# Patient Record
Sex: Male | Born: 1965 | Race: White | Hispanic: No | Marital: Married | Smoking: Never smoker
Health system: Southern US, Community
[De-identification: ages and names within clinical notes are randomized; demographics above are authoritative.]

## PROBLEM LIST (undated history)

## (undated) DIAGNOSIS — E785 Hyperlipidemia, unspecified: Secondary | ICD-10-CM

## (undated) DIAGNOSIS — I10 Essential (primary) hypertension: Secondary | ICD-10-CM

## (undated) DIAGNOSIS — G4730 Sleep apnea, unspecified: Secondary | ICD-10-CM

## (undated) HISTORY — PX: OTHER SURGICAL HISTORY: SHX169

## (undated) HISTORY — DX: Essential (primary) hypertension: I10

## (undated) HISTORY — DX: Hyperlipidemia, unspecified: E78.5

---

## 1995-07-30 HISTORY — PX: OTHER SURGICAL HISTORY: SHX169

## 2009-10-20 ENCOUNTER — Ambulatory Visit: Payer: Self-pay | Admitting: Family Medicine

## 2009-10-20 DIAGNOSIS — R030 Elevated blood-pressure reading, without diagnosis of hypertension: Secondary | ICD-10-CM | POA: Insufficient documentation

## 2009-10-20 DIAGNOSIS — R635 Abnormal weight gain: Secondary | ICD-10-CM | POA: Insufficient documentation

## 2009-10-22 ENCOUNTER — Encounter: Payer: Self-pay | Admitting: Family Medicine

## 2009-10-23 ENCOUNTER — Encounter: Payer: Self-pay | Admitting: Family Medicine

## 2009-10-23 LAB — CONVERTED CEMR LAB
Albumin: 4.4 g/dL (ref 3.5–5.2)
BUN: 20 mg/dL (ref 6–23)
Calcium: 9.5 mg/dL (ref 8.4–10.5)
Chloride: 105 meq/L (ref 96–112)
Cholesterol: 158 mg/dL (ref 0–200)
Creatinine, Ser: 1.09 mg/dL (ref 0.40–1.50)
Glucose, Bld: 106 mg/dL — ABNORMAL HIGH (ref 70–99)
HCT: 46.2 % (ref 39.0–52.0)
HDL: 49 mg/dL (ref >39)
Hemoglobin: 16.2 g/dL (ref 13.0–17.0)
Potassium: 4.7 meq/L (ref 3.5–5.3)
RBC: 5.33 M/uL (ref 4.22–5.81)
Total CHOL/HDL Ratio: 3.2
Triglycerides: 84 mg/dL (ref <150)

## 2009-11-15 ENCOUNTER — Ambulatory Visit: Payer: Self-pay | Admitting: Family Medicine

## 2009-11-15 DIAGNOSIS — R7301 Impaired fasting glucose: Secondary | ICD-10-CM | POA: Insufficient documentation

## 2009-11-15 DIAGNOSIS — R5383 Other fatigue: Secondary | ICD-10-CM

## 2009-11-15 DIAGNOSIS — R5381 Other malaise: Secondary | ICD-10-CM | POA: Insufficient documentation

## 2009-12-13 ENCOUNTER — Encounter: Admission: RE | Admit: 2009-12-13 | Discharge: 2010-03-13 | Payer: Self-pay | Admitting: Family Medicine

## 2010-01-24 ENCOUNTER — Telehealth: Payer: Self-pay | Admitting: Family Medicine

## 2010-01-31 ENCOUNTER — Ambulatory Visit: Payer: Self-pay | Admitting: Family Medicine

## 2010-02-01 ENCOUNTER — Encounter: Payer: Self-pay | Admitting: Family Medicine

## 2010-02-02 ENCOUNTER — Encounter: Payer: Self-pay | Admitting: Family Medicine

## 2010-02-02 LAB — CONVERTED CEMR LAB
Free T4: 1.09 ng/dL (ref 0.80–1.80)
Sex Hormone Binding: 29 nmol/L (ref 13–71)
TSH: 1.607 microintl units/mL (ref 0.350–4.500)
Testosterone Free: 45 pg/mL — ABNORMAL LOW (ref 47.0–244.0)
Testosterone-% Free: 2.1 % (ref 1.6–2.9)

## 2010-02-05 ENCOUNTER — Ambulatory Visit: Payer: Self-pay | Admitting: Family Medicine

## 2010-02-05 DIAGNOSIS — E291 Testicular hypofunction: Secondary | ICD-10-CM | POA: Insufficient documentation

## 2010-03-08 ENCOUNTER — Ambulatory Visit: Payer: Self-pay | Admitting: Family Medicine

## 2010-03-16 ENCOUNTER — Ambulatory Visit: Payer: Self-pay | Admitting: Family Medicine

## 2010-03-16 DIAGNOSIS — M7710 Lateral epicondylitis, unspecified elbow: Secondary | ICD-10-CM | POA: Insufficient documentation

## 2010-03-16 LAB — CONVERTED CEMR LAB: Hgb A1c MFr Bld: 5.5 %

## 2010-03-22 ENCOUNTER — Encounter: Admission: RE | Admit: 2010-03-22 | Discharge: 2010-04-26 | Payer: Self-pay | Admitting: Family Medicine

## 2010-03-23 ENCOUNTER — Ambulatory Visit: Payer: Self-pay | Admitting: Family Medicine

## 2010-03-30 ENCOUNTER — Ambulatory Visit: Payer: Self-pay | Admitting: Family Medicine

## 2010-04-06 ENCOUNTER — Ambulatory Visit: Payer: Self-pay | Admitting: Family Medicine

## 2010-04-13 ENCOUNTER — Ambulatory Visit: Payer: Self-pay | Admitting: Family Medicine

## 2010-04-20 ENCOUNTER — Ambulatory Visit: Payer: Self-pay | Admitting: Family Medicine

## 2010-05-04 ENCOUNTER — Ambulatory Visit: Payer: Self-pay | Admitting: Family Medicine

## 2010-05-07 LAB — CONVERTED CEMR LAB
Bilirubin, Direct: 0.3 mg/dL (ref 0.0–0.3)
Indirect Bilirubin: 1.1 mg/dL — ABNORMAL HIGH (ref 0.0–0.9)
Testosterone Free: 116.8 pg/mL (ref 47.0–244.0)
Testosterone-% Free: 2.4 % (ref 1.6–2.9)
Total Bilirubin: 1.4 mg/dL — ABNORMAL HIGH (ref 0.3–1.2)
Total Protein: 6.8 g/dL (ref 6.0–8.3)

## 2010-05-23 ENCOUNTER — Encounter: Payer: Self-pay | Admitting: Family Medicine

## 2010-05-28 ENCOUNTER — Encounter: Payer: Self-pay | Admitting: Family Medicine

## 2010-06-01 ENCOUNTER — Ambulatory Visit: Payer: Self-pay | Admitting: Family Medicine

## 2010-07-02 ENCOUNTER — Ambulatory Visit: Payer: Self-pay | Admitting: Family Medicine

## 2010-08-03 ENCOUNTER — Telehealth: Payer: Self-pay | Admitting: Family Medicine

## 2010-08-03 ENCOUNTER — Ambulatory Visit
Admission: RE | Admit: 2010-08-03 | Discharge: 2010-08-03 | Payer: Self-pay | Source: Home / Self Care | Attending: Family Medicine | Admitting: Family Medicine

## 2010-08-28 NOTE — Assessment & Plan Note (Signed)
Summary: TESTOSTERONE INJECTION  Nurse Visit   Allergies: No Known Drug Allergies  Medication Administration  Injection # 1:    Medication: Testosterone Cypionat 200mg  ing    Diagnosis: HYPOGONADISM (ICD-257.2)    Route: IM    Site: RUOQ gluteus    Exp Date: 05/29/2012    Lot #: [id number]    Mfr: pfizer    Patient tolerated injection without complications    Given by: Kathlene November (February 05, 2010 8:05 AM)  Orders Added: 1)  Testosterone Cypionat 200mg  ing [J1080] 2)  Admin of Therapeutic Inj  intramuscular or subcutaneous [16109]

## 2010-08-28 NOTE — Assessment & Plan Note (Signed)
Summary: NURSE  Nurse Visit   Allergies: No Known Drug Allergies  Medication Administration  Injection # 1:    Medication: Testosterone Cypionat 200mg  ing    Diagnosis: HYPOGONADISM (ICD-257.2)    Route: IM    Site: RUOQ gluteus    Exp Date: 07/29/2012    Lot #: [id number]    Mfr: pfizer    Comments: 200 mg/g     Patient tolerated injection without complications    Given by: Avon Gully CMA, Duncan Dull) (April 20, 2010 8:37 AM)  Orders Added: 1)  Admin of Therapeutic Inj  intramuscular or subcutaneous [21308]

## 2010-08-28 NOTE — Consult Note (Signed)
Summary: [facility]   Imported By: Lanelle Bal 06/19/2010 14:16:43  _____________________________________________________________________  External Attachment:    Type:   Image     Comment:   External Document

## 2010-08-28 NOTE — Assessment & Plan Note (Signed)
Summary: 6 week f/u, injection  test, endocrine   Vital Signs:  Patient profile:   45 year old male Height:      70.5 inches Weight:      252 pounds Pulse rate:   75 / minute BP sitting:   141 / 66  (left arm) Cuff size:   large  Vitals Entered By: Avon Gully CMA, Duncan Dull) (May 04, 2010 8:12 AM) CC: f/u testosterone,pt feels down because he is still gaining wt even since he started testosterone and seeing the nutitionist   Primary Care Provider:  Nani Gasser MD  CC:  f/u testosterone and pt feels down because he is still gaining wt even since he started testosterone and seeing the nutitionist.  History of Present Illness: f/u testosterone, pt feels down because he is still gaining wt even since he started testosterone and seeing the nutitionist. ON the testosterone feel bulidng better muscle mass.  Has felt more fatigued. Falling asleep at work. Denies any side effects from testoterone. No mood changes.   No snoring or hx of sleep apnea  Current Medications (verified): 1)  Fioricet 50-325-40 Mg Tabs (Butalbital-Apap-Caffeine) .... As Needed For Headache 2)  Mega Multi Men  Cr-Tabs (Multiple Vitamins-Minerals) .... Take 1 Tablet By Mouth Once A Day 3)  Flax Seed Oil 1000 Mg Caps (Flaxseed (Linseed)) .... Take 2 Capsules Daily. 4)  Cinnamon 500 Mg Tabs (Cinnamon) .... Take 2 Tablets Daily. 5)  Testosterone Cypionate 200 Mg/ml Oil (Testosterone Cypionate) .... 200 Mg Im Injection Q 4 Wks 6)  Hydrochlorothiazide 25 Mg Tabs (Hydrochlorothiazide) .... Take 1 Tablet By Mouth Once A Day  Allergies (verified): No Known Drug Allergies  Comments:  Nurse/Medical Assistant: The patient's medications and allergies were reviewed with the patient and were updated in the Medication and Allergy Lists. Avon Gully CMA, Duncan Dull) (May 04, 2010 8:13 AM)  Physical Exam  General:  Well-developed,well-nourished,in no acute distress; alert,appropriate and cooperative  throughout examination   Impression & Recommendations:  Problem # 1:  HYPOGONADISM (ICD-257.2) Will check levels and labs today. Make sure liver and PSA are stable.  Can give next injection today.  Orders: T-Testosterone, Free and Total [phone]) T-PSA [phone]) T-Hepatic Function [phone] Testosterone Cypionat 200mg  ing (J1080) Admin of Therapeutic Inj  intramuscular or subcutaneous (54270)  Problem # 2:  WEIGHT GAIN (ICD-783.1) I this point I am not sure why he is not losing weight. He has been working with a nutritionist adn has been working out regularly . Has had normal thyroid and AM cortisol. Will refer to Endocrine for further evaluation.  Orders: Endocrinology Referral (Endocrine)  Complete Medication List: 1)  Fioricet 50-325-40 Mg Tabs (Butalbital-apap-caffeine) .... As needed for headache 2)  Mega Multi Men Cr-tabs (Multiple vitamins-minerals) .... Take 1 tablet by mouth once a day 3)  Flax Seed Oil 1000 Mg Caps (Flaxseed (linseed)) .... Take 2 capsules daily. 4)  Cinnamon 500 Mg Tabs (Cinnamon) .... Take 2 tablets daily. 5)  Testosterone Cypionate 200 Mg/ml Oil (Testosterone cypionate) .... 200 mg im injection q 4 wks 6)  Hydrochlorothiazide 25 Mg Tabs (Hydrochlorothiazide) .... Take 1 tablet by mouth once a day  Other Orders: Flu Vaccine 41yrs + (62376) Admin 1st Vaccine (28315)  Patient Instructions: 1)  We will call you with the endocrine referral.  2)  Once get labs will call and let you know when to f/u for the next test injection.    Medication Administration  Injection # 1:    Medication: Testosterone  Cypionat 200mg  ing    Diagnosis: HYPOGONADISM (ICD-257.2)    Route: IM    Site: LUOQ gluteus    Exp Date: 07/29/2012    Lot #: [id number]    Mfr: Pfizer    Patient tolerated injection without complications    Given by: Avon Gully CMA, Duncan Dull) (May 04, 2010 8:50 AM)  Orders Added: 1)  T-Testosterone, Free and Total  [phone] 2)  T-PSA [phone] 3)  T-Hepatic Function [phone] 4)  Endocrinology Referral [Endocrine] 5)  Testosterone Cypionat 200mg  ing [J1080] 6)  Flu Vaccine 71yrs + [90658] 7)  Admin 1st Vaccine [90471] 8)  Admin of Therapeutic Inj  intramuscular or subcutaneous [96372] 9)  Est. Patient Level II [60630]    Immunizations Administered:  Influenza Vaccine # 1:    Vaccine Type: Fluvax 3+    Site: right deltoid    Mfr: GlaxoSmithKline    Dose: 0.5 ml    Route: IM    Given by: Sue Lush McCrimmon CMA, (AAMA)    Exp. Date: 01/26/2011    Lot #: [id number]    VIS given: 02/20/10 version given May 04, 2010.  Flu Vaccine Consent Questions:    Do you have a history of severe allergic reactions to this vaccine? no    Any prior history of allergic reactions to egg and/or gelatin? no    Do you have a sensitivity to the preservative Thimersol? no    Do you have a past history of Guillan-Barre Syndrome? no    Do you currently have an acute febrile illness? no    Have you ever had a severe reaction to latex? no    Vaccine information given and explained to patient? no

## 2010-08-28 NOTE — Assessment & Plan Note (Signed)
Summary: nurse  Nurse Visit   Allergies: No Known Drug Allergies  Medication Administration  Injection # 1:    Medication: Testosterone Cypionat 200mg  ing    Diagnosis: HYPOGONADISM (ICD-257.2)    Route: IM    Site: RUOQ gluteus    Exp Date: 07/29/2012    Lot #: [id number]    Mfr: Pfizer    Patient tolerated injection without complications    Given by: Kathlene November (April 13, 2010 3:24 PM)  Orders Added: 1)  Testosterone Cypionat 200mg  ing [J1080] 2)  Admin of Therapeutic Inj  intramuscular or subcutaneous [16109]

## 2010-08-28 NOTE — Assessment & Plan Note (Signed)
Summary: injection  Nurse Visit   Allergies: No Known Drug Allergies  Medication Administration  Injection # 1:    Medication: Testosterone Cypionat 200mg  ing    Diagnosis: HYPOGONADISM (ICD-257.2)    Route: IM    Site: RUOQ gluteus    Exp Date: 11/26/2012    Lot #: [id number]    Mfr: Pfizer    Patient tolerated injection without complications    Given by: Kathlene November LPN (June 01, 2010 8:26 AM)  Orders Added: 1)  Testosterone Cypionat 200mg  ing [J1080] 2)  Admin of Therapeutic Inj  intramuscular or subcutaneous [04540]

## 2010-08-28 NOTE — Assessment & Plan Note (Signed)
Summary: testosterone  Nurse Visit   Allergies: No Known Drug Allergies  Medication Administration  Injection # 1:    Medication: Testosterone Cypionat 200mg  ing    Diagnosis: HYPOGONADISM (ICD-257.2)    Route: IM    Site: LUOQ gluteus    Exp Date: 07/29/2012    Lot #: [id number]    Mfr: Pfizer    Comments: 200mg /ml given     Patient tolerated injection without complications    Given by: Avon Gully CMA, Duncan Dull) (April 06, 2010 8:39 AM)  Orders Added: 1)  Testosterone Cypionat 200mg  ing [J1080] 2)  Admin of Therapeutic Inj  intramuscular or subcutaneous [16109]

## 2010-08-28 NOTE — Progress Notes (Signed)
Summary: Labs  Phone Note Call from Patient Call back at [phone]   Caller: Dustin Kennedy- Registered Dietician Call For: Dustin Kennedy Summary of Call: Pt in nutrition class at [facility] and is not having very much sucess with weight loss per Dustin Kennedy RD. Following a 1800 calorie diet and exercising daily and would like additional labs like extensive thyroid testing and testosterone level and anything else that could effect the weight loss. Dustin states he is really pushing her for additional labs and she told him that was out of her scope of practice and that she would let the MD know so they could order any labs Initial call taken by: Kathlene November,  January 24, 2010 2:09 PM Caller: Dustin Kennedy Call For: Dustin Kennedy  Follow-up for Phone Call        Pls schedule pt with me this wk or next to review and do labs on this day. Follow-up by: Seymour Bars DO,  January 24, 2010 4:30 PM     Appended Document: Labs 01/25/2010 @ 11:30am-Pt notifeid to call and schedule an apointment with Dr. Cathey Endow via VM. KJ LPN

## 2010-08-28 NOTE — Assessment & Plan Note (Signed)
Summary: testosterone  Nurse Visit   Allergies: No Known Drug Allergies  Medication Administration  Injection # 1:    Medication: Testosterone Cypionat 200mg  ing    Diagnosis: HYPOGONADISM (ICD-257.2)    Route: IM    Site: RUOQ gluteus    Exp Date: 07/29/2012    Lot #:  [id number]    Mfr: Pfizer    Comments: 200mg /ml injected    Patient tolerated injection without complications    Given by: Avon Gully CMA, Duncan Dull) (March 30, 2010 8:50 AM)  Orders Added: 1)  Testosterone Cypionat 200mg  ing [J1080] 2)  Admin of Therapeutic Inj (IM or [place]) [13086]

## 2010-08-28 NOTE — Assessment & Plan Note (Signed)
Summary: 4 MONTH FU HTN, sugar, Late epicondylitis   Vital Signs:  Patient profile:   45 year old male Height:      70.5 inches Weight:      248 pounds Pulse rate:   80 / minute BP sitting:   142 / 73  (left arm) Cuff size:   large  Vitals Entered By: Avon Gully CMA, Duncan Dull) (March 16, 2010 8:17 AM) CC: check FBS   CC:  check FBS.  Current Medications (verified): 1)  Fioricet 50-325-40 Mg Tabs (Butalbital-Apap-Caffeine) .... As Needed For Headache 2)  Mega Multi Men  Cr-Tabs (Multiple Vitamins-Minerals) .... Take 1 Tablet By Mouth Once A Day 3)  Flax Seed Oil 1000 Mg Caps (Flaxseed (Linseed)) .... Take 2 Capsules Daily. 4)  Cinnamon 500 Mg Tabs (Cinnamon) .... Take 2 Tablets Daily. 5)  Testosterone Cypionate 200 Mg/ml Oil (Testosterone Cypionate) .... 200 Mg Im Injection Q 4 Wks  Allergies (verified): No Known Drug Allergies  Comments:  Nurse/Medical Assistant: The patient's medications and allergies were reviewed with the patient and were updated in the Medication and Allergy Lists. Avon Gully CMA, Duncan Dull) (March 16, 2010 8:17 AM)  Physical Exam  General:  Well-developed,well-nourished,in no acute distress; alert,appropriate and cooperative throughout examination Head:  Normocephalic and atraumatic without obvious abnormalities. No apparent alopecia or balding. Lungs:  Normal respiratory effort, chest expands symmetrically. Lungs are clear to auscultation, no crackles or wheezes. Heart:  Normal rate and regular rhythm. S1 and S2 normal without gallop, murmur, click, rub or other extra sounds. Msk:  Left elbow with NROM and strength 5/5. Tender over the lateral epicondyle. Pain with wrist extensino against resistance adn with supination.  Extremities:  NO LE edema.  Skin:  no rashes.   Psych:  Cognition and judgment appear intact. Alert and cooperative with normal attention span and concentration. No apparent delusions, illusions, hallucinations   Impression  & Recommendations:  Problem # 1:  ELEVATED BLOOD PRESSURE WITHOUT DIAGNOSIS OF HYPERTENSION (ICD-796.2) BP still elevated. Discussed stating a medication. Will start with a diuretic. Warned about potential SE.S/U in 6 weeks nad check BMP at that time. He is still working with a nutritionnist to loose weight. Eating 1800 calories a day.  His updated medication list for this problem includes:    Hydrochlorothiazide 25 Mg Tabs (Hydrochlorothiazide) .Marland Kitchen... Take 1 tablet by mouth once a day  Problem # 2:  IMPAIRED FASTING GLUCOSE (ICD-790.21) Glucose 140 this AM fasting but A1C is at goal.  Orders: Fingerstick (36416) Glucose, (CBG) (82962) Hemoglobin A1C (83036)  Problem # 3:  LATERAL EPICONDYLITIS (ICD-726.32) Given H.O on exercises. Iceing and NSAIDs for pan relief.  Discussed dec his weights with weight lifting as this is clearly exacerbating his sxs.   F/U in 6 weeks.   Problem # 4:  HYPOGONADISM (ICD-257.2) One week to see the nurse for next testosterone shot. Will do weekly for one month and then every other week for one month and then recheck levels.   Complete Medication List: 1)  Fioricet 50-325-40 Mg Tabs (Butalbital-apap-caffeine) .... As needed for headache 2)  Mega Multi Men Cr-tabs (Multiple vitamins-minerals) .... Take 1 tablet by mouth once a day 3)  Flax Seed Oil 1000 Mg Caps (Flaxseed (linseed)) .... Take 2 capsules daily. 4)  Cinnamon 500 Mg Tabs (Cinnamon) .... Take 2 tablets daily. 5)  Testosterone Cypionate 200 Mg/ml Oil (Testosterone cypionate) .... 200 mg im injection q 4 wks 6)  Hydrochlorothiazide 25 Mg Tabs (Hydrochlorothiazide) .... Take 1 tablet by  mouth once a day  Patient Instructions: 1)  See handout for your elbow 2)  Start HCTZ blood pressure med once a day in the AM 3)  Follow up in 6-8 weeks for blood pressure and testosterone.  4)  One week to see the nurse for next testosterone shot. Will do weekly for one month and then every other week for one  month and then recheck levels.  Prescriptions: HYDROCHLOROTHIAZIDE 25 MG TABS (HYDROCHLOROTHIAZIDE) Take 1 tablet by mouth once a day  #30 x 1   Entered and Authorized by:   Nani Gasser MD   Signed by:   Nani Gasser MD on 03/16/2010   Method used:   Electronically to        [facility] [phone]* (retail)       [address]       [place], [place]  [address]       Ph: [phone]       Fax: [phone]   RxID:   [id number]   Laboratory Results   Blood Tests   Date/Time Received: 03/16/10 Date/Time Reported: 03/16/10  HGBA1C: 5.5%   (Normal Range: Non-Diabetic - 3-6%   Control Diabetic - 6-8%) CBG Fasting:: 140mg /dL         Appended Document: 4 MONTH FU HTN, sugar, Late epicondylitis    Clinical Lists Changes  Orders: Added new Service order of Testosterone Cypionat 200mg  ing (G4010) - Signed Added new Service order of Admin of Therapeutic Inj (IV) (27253) - Signed       Medication Administration  Injection # 1:    Medication: Testosterone Cypionat 200mg  ing    Diagnosis: HYPOGONADISM (ICD-257.2)    Route: IM    Site: RUOQ gluteus    Exp Date: 07/29/2012    Lot #: [id number]    Mfr: Phizer    Patient tolerated injection without complications    Given by: Avon Gully CMA, Duncan Dull) (March 16, 2010 9:57 AM)  Orders Added: 1)  Testosterone Cypionat 200mg  ing [J1080] 2)  Admin of Therapeutic Inj (IV) [66440]

## 2010-08-28 NOTE — Assessment & Plan Note (Signed)
Summary: injection  Nurse Visit   Allergies: No Known Drug Allergies  Medication Administration  Injection # 1:    Medication: Testosterone Cypionat 200mg  ing    Diagnosis: HYPOGONADISM (ICD-257.2)    Route: IM    Site: RUOQ gluteus    Exp Date: 07/29/2012    Lot #: [id number]    Mfr: pfizer    Patient tolerated injection without complications    Given by: Kathlene November (March 23, 2010 8:39 AM)  Orders Added: 1)  Testosterone Cypionat 200mg  ing [J1080] 2)  Admin of Therapeutic Inj  intramuscular or subcutaneous [18841]

## 2010-08-28 NOTE — Assessment & Plan Note (Signed)
Summary: NOV: WEight flutuations, elevated BP   Vital Signs:  Patient profile:   45 year old male Height:      70.5 inches Weight:      246.75 pounds BMI:     35.03 Pulse rate:   69 / minute Pulse rhythm:   regular Resp:     16 per minute BP sitting:   143 / 87  (right arm) Cuff size:   large  Vitals Entered By: Mervin Kung CMA (October 20, 2009 8:41 AM) Is Patient Diabetic? No   Primary Care Provider:  Nani Gasser MD   History of Present Illness: Hx of HTN, Stopped meds in 2009 so stopped the med. Was having HA initally but these resolved.  Was taking Norvasc and then fiorcet for the HA. NO recent HA.  His of elevated choleserol. No CP or SOB. Weight really fluctuates even thought works on regularly.  He is concerned about hormone issues.  Feels eat about 1800 calories.  Also feels short term memory is off.  Occ feels more chilly whereas previously si more hot natured.  No skin or hair changes.   No swelling or hands or feet. Has notice some increased urgency to his urination but it is usually in the mornings. Does drink over 60 oz of fluid in the first 2-3 hours that he gets up. Denies any change in urinary stream or force of stream or dribbling. Denies waking up more than once at night to urinate.  Does drink a protein drink inteh AM as well but no creatine products.  Says thinks he eats about 1800 calories per day.     Habits & Providers  Alcohol-Tobacco-Diet     Alcohol drinks/day: 3     Tobacco Status: never  Exercise-Depression-Behavior     Does Patient Exercise: yes     Type of exercise: cardio/weights     Exercise (avg: min/session): 30-60     Times/week: 6     Drug Use: no  Allergies (verified): No Known Drug Allergies  Past History:  Past Medical History: HTN Hypercholesterolemia  Past Surgical History: Left shoulder--1997 Lipomas removal x 4.   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  Associ degree. Married to [place] with 2 adult kids.  Never Smoked Alcohol use-yes Drug use-no Regular exercise with cardio and weights.  3-4 caffeinated dirnsk per day (coffee) Smoking Status:  never Drug Use:  no Does Patient Exercise:  yes  Review of Systems       No fever/sweats/weakness, unexplained weight loss/gain.  No vison changes.  No difficulty hearing/ringing in ears, hay fever/allergies.  No chest pain/discomfort, palpitations.  No Br lump/nipple discharge.  No cough/wheeze.  No blood in BM, nausea/vomiting/diarrhea.  No nighttime urination, leaking urine, unusual vaginal bleeding, discharge (penis or vagina).  No muscle/joint pain. No rash, change in mole.  No HA, memory loss.  No anxiety, sleep d/o, depression.  No easy bruising/bleeding, unexplained lump   Physical Exam  General:  Well-developed,well-nourished,in no acute distress; alert,appropriate and cooperative throughout examination Head:  Normocephalic and atraumatic without obvious abnormalities. No apparent alopecia or balding. Neck:  No deformities, masses, or tenderness noted. No TM.  Lungs:  Normal respiratory effort, chest expands symmetrically. Lungs are clear to auscultation, no crackles or wheezes. Heart:  Normal rate and regular rhythm. S1 and S2 normal without gallop, murmur, click, rub or other extra sounds. Pulses:  RAdial 2+ bilat.  Neurologic:  alert & oriented X3.   Skin:  no rashes.   Cervical Nodes:  No lymphadenopathy noted Psych:  Cognition and judgment appear intact. Alert and cooperative with normal attention span and concentration. No apparent delusions, illusions, hallucinations   Impression & Recommendations:  Problem # 1:  WEIGHT GAIN (ICD-783.1) Wil check his thyroid level.  Also will refer for nutrition counseling for weight loss.  I really think this is not hormonal and may have to do with his fluctuation in work out and diet.  He is not using  creatine. Will also rule out any kidney or liver dysfunction.   Orders: T-TSH ([phone]) T-Comprehensive Metabolic Panel [id number]) T-CBC No Diff ([id number]) Nutrition Referral (Nutrition)  Problem # 2:  ELEVATED BLOOD PRESSURE WITHOUT DIAGNOSIS OF HYPERTENSION (ICD-796.2) REpeat was high but he does drink an enourmous amount of caffeine. Probely between 40-60 ounces in teh AM. Discussed cutting back on his caffeine intake and then recheck in BP in 3 weeks.  In meantime will check lipid screen adn thyroid d/o.  Also avoid salt intake.  Orders: T-TSH [phone]) T-Comprehensive Metabolic Panel [phone]) T-Lipid Profile [phone]) T-CBC No Diff ([id number])  Complete Medication List: 1)  Fioricet 50-325-40 Mg Tabs (Butalbital-apap-caffeine) .... As needed for headache  Patient Instructions: 1)  Please schedule a follow-up appointment in 3-4 weeks for repeat BP check. Decrease caffeine intake to a more moderate level.    Current Allergies (reviewed today): No known allergies      Vital Signs:  Patient Profile:   45 year old male Height:     70.5 inches Weight:      246.75 pounds BMI:     35.03 Pulse rate:   69 / minute Pulse rhythm:   regular Resp:     16 per minute BP sitting:   143 / 87 Cuff size:   large                   Appended Document: NOV: WEight flutuations, elevated BP    Family History: HTN, Hi cholesterol,  Thyroid dz stroke  Call pt: Thank him for the medical hx update. We will call again once I recieve his lab report. March 28, 20118:46 AM Saabir Blyth MD, Santina Evans  10/23/2009 @ 8:47am-LMOM of above. KJ LPN

## 2010-08-28 NOTE — Assessment & Plan Note (Signed)
Summary: 3 WEEK BP CK   Vital Signs:  Patient profile:   45 year old male Height:      70.5 inches Weight:      246 pounds Pulse rate:   55 / minute BP sitting:   128 / 71  (left arm) Cuff size:   large  Vitals Entered By: Kathlene November (November 15, 2009 8:04 AM) CC: follow-up BP   CC:  follow-up BP.  Current Medications (verified): 1)  Fioricet 50-325-40 Mg Tabs (Butalbital-Apap-Caffeine) .... As Needed For Headache  Allergies (verified): No Known Drug Allergies  Comments:  Nurse/Medical Assistant: The patient's medications and allergies were reviewed with the patient and were updated in the Medication and Allergy Lists. Kathlene November (November 15, 2009 8:04 AM)  Physical Exam  General:  Well-developed,well-nourished,in no acute distress; alert,appropriate and cooperative throughout examination Head:  Normocephalic and atraumatic without obvious abnormalities. No apparent alopecia or balding.   Impression & Recommendations:  Problem # 1:  ELEVATED BLOOD PRESSURE WITHOUT DIAGNOSIS OF HYPERTENSION (ICD-796.2) Looks great today. He has cut his caffeine by halft. Recheck in 6-12 months. Pt says has also been folllowing it at home and it looks good at home.  Overall he really has a healthy diet and works out.   Problem # 2:  IMPAIRED FASTING GLUCOSE (ICD-790.21)  Reviewed his labs with him today and it looks great, excpet for glucose. Will recheck today. Repeat still in insulin resistance range. Discussed decreasing carbs and concnetrated sweets. He already has appt wih the nutritionist. Recheck in 4 months.   Orders: Fingerstick (36416) Glucose, (CBG) (16109)  Problem # 3:  FATIGUE, ACUTE (ICD-780.79) Discussed that likely from weaning his caffeine. His body is probably getting used to this.  If not better in a couple of weeks then will check Free T4/T3, AM cortisol, and AM testosterone.    Complete Medication List: 1)  Fioricet 50-325-40 Mg Tabs (Butalbital-apap-caffeine)  .... As needed for headache  Patient Instructions: 1)  Please schedule a follow-up appointment in 4 months to recheck blood sugar.   Laboratory Results   Blood Tests   Date/Time Received: 11/15/2009 Date/Time Reported: 11/15/2009  Glucose (fasting): 111 mg/dL   (Normal Range: 60-454)

## 2010-08-28 NOTE — Letter (Signed)
Summary: Letter from Patient Regarding Family Health History  Letter from Patient Regarding Family Health History   Imported By: Lanelle Bal 10/27/2009 14:00:21  _____________________________________________________________________  External Attachment:    Type:   Image     Comment:   External Document

## 2010-08-28 NOTE — Assessment & Plan Note (Signed)
Summary: weight   Vital Signs:  Patient profile:   45 year old male Height:      70.5 inches Weight:      248.75 pounds BMI:     35.31 Temp:     97.8 degrees F oral Pulse rate:   65 / minute Pulse rhythm:   regular Resp:     18 per minute BP sitting:   144 / 82  (right arm) Cuff size:   large  Vitals Entered By: Mervin Kung CMA Duncan Dull) (January 31, 2010 2:24 PM) CC: Room 1   Pt at plateau with weight loss and would like thyroid and testosterone work up to assess possible reasons. Is Patient Diabetic? No   Primary Care Provider:  Nani Gasser MD  CC:  Room 1   Pt at plateau with weight loss and would like thyroid and testosterone work up to assess possible reasons..  History of Present Illness: 45 yo WM presents for f/u weight management.  He has met with a nutritionist and was eating 2200 kcal/ day which was reduced to 1800 kcal/ day.    He works out by doing cardio and [employer].  He has gained 25 lbs in 6 mos but he doesn't know why.  He travels 2 days/ wk for work.  Has a sedentary job.  Would like to get down to 220.   He had a normal TSH in March.     Allergies (verified): No Known Drug Allergies  Past History:  Past Medical History: Reviewed history from 10/20/2009 and no changes required. HTN Hypercholesterolemia  Past Surgical History: Reviewed history from 10/20/2009 and no changes required. Left shoulder--1997 Lipomas removal x 4.   Social History: Reviewed history from 10/20/2009 and no changes required. 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  Associ degree. Married to [place] with 2 adult kids.  Never Smoked Alcohol use-yes Drug use-no Regular exercise with cardio and weights.  3-4 caffeinated dirnsk per day (coffee)  Review of Systems      See HPI  Physical Exam  General:  alert, well-developed, well-nourished, and well-hydrated.   Head:  normocephalic and atraumatic.   Neck:  no masses.   Lungs:  Normal respiratory effort, chest expands  symmetrically. Lungs are clear to auscultation, no crackles or wheezes. Heart:  Normal rate and regular rhythm. S1 and S2 normal without gallop, murmur, click, rub or other extra sounds. Msk:  mild diastasis recti of the upper ab muscles Skin:  color normal.     Impression & Recommendations:  Problem # 1:  WEIGHT GAIN (ICD-783.1) Counseled pt about his weight management plan.  Will obtain Free T3 and Free T4 with his TSH today along with his testosterone level.  He should stay around 1800 kcal/ day diet, high protein / low carb.   Orders: T-TSH [phone]) T-T3, Free [phone]) T-T4, Free [phone]) T-Testosterone, Free and Total [phone])  Complete Medication List: 1)  Fioricet 50-325-40 Mg Tabs (Butalbital-apap-caffeine) .... As needed for headache 2)  Mega Multi Men Cr-tabs (Multiple vitamins-minerals) .... Take 1 tablet by mouth once a day 3)  Flax Seed Oil 1000 Mg Caps (Flaxseed (linseed)) .... Take 2 capsules daily. 4)  Cinnamon 500 Mg Tabs (Cinnamon) .... Take 2 tablets daily.  Patient Instructions: 1)  Labs today. 2)  will call you w/ results tomorrow. 3)  Keep up the 1800 kcal / day diet. 4)  Focus on high protein low carb. 5)  Aim for 1+ hrs of exercise 5 days/ wk.  Current  Allergies (reviewed today): No known allergies

## 2010-08-28 NOTE — Assessment & Plan Note (Signed)
Summary: INJECTIOn  Nurse Visit   Allergies: No Known Drug Allergies  Medication Administration  Injection # 1:    Medication: Testosterone Cypionat 200mg  ing    Diagnosis: HYPOGONADISM (ICD-257.2)    Route: IM    Site: RUOQ gluteus    Exp Date: 07/29/2012    Lot #: [id number]    Mfr: Pfizer    Patient tolerated injection without complications    Given by: Kathlene November (March 08, 2010 8:27 AM)  Orders Added: 1)  Testosterone Cypionat 200mg  ing [J1080] 2)  Admin of Therapeutic Inj  intramuscular or subcutaneous [[id number]]

## 2010-08-30 NOTE — Progress Notes (Signed)
Summary: PT REQ A CALL  Phone Note Call from Patient   Caller: Patient Summary of Call: PT REQ TO HAVE NURSE CALL TO ADVISE WHEN HE NEEDS TO SCHEDULE APPOINTMENT WITH DR. Linford Arnold [phone] Initial call taken by: Janeal Holmes,  August 03, 2010 10:12 AM  Follow-up for Phone Call        Called pt and LM for pt to return call. Follow-up by: Kathlene November LPN,  August 07, 2010 4:09 PM  Additional Follow-up for Phone Call Additional follow up Details #1::        lm to return call;left message on vm that pt due for f/u labs on bili and test in feb Additional Follow-up by: Avon Gully CMA, Duncan Dull),  August 10, 2010 12:00 PM

## 2010-08-30 NOTE — Assessment & Plan Note (Signed)
Summary: TEST inj  Nurse Visit   Allergies: No Known Drug Allergies  Medication Administration  Injection # 1:    Medication: Testosterone Cypionat 200mg  ing    Diagnosis: HYPOGONADISM (ICD-257.2)    Route: IM    Site: RUOQ gluteus    Exp Date: 11/27/2011    Lot #: [id number]    Patient tolerated injection without complications    Given by: Avon Gully CMA, Duncan Dull) (August 03, 2010 8:41 AM)  Orders Added: 1)  Testosterone Cypionat 200mg  ing [J1080] 2)  Admin of patients own med IM/SQ [[id number]]

## 2010-08-31 ENCOUNTER — Ambulatory Visit: Payer: PRIVATE HEALTH INSURANCE

## 2010-08-31 ENCOUNTER — Encounter: Payer: Self-pay | Admitting: Family Medicine

## 2010-08-31 ENCOUNTER — Ambulatory Visit: Admit: 2010-08-31 | Payer: Self-pay | Admitting: Family Medicine

## 2010-08-31 DIAGNOSIS — E291 Testicular hypofunction: Secondary | ICD-10-CM

## 2010-09-05 NOTE — Assessment & Plan Note (Signed)
Summary: NURSE VISIT  Nurse Visit   Allergies: No Known Drug Allergies  Medication Administration  Injection # 1:    Medication: Testosterone Cypionat 200mg  ing    Diagnosis: HYPOGONADISM (ICD-257.2)    Route: IM    Site: LUOQ gluteus    Exp Date: 11/26/2012    Lot #: [id number]    Mfr: pfizer    Patient tolerated injection without complications    Given by: Avon Gully CMA, Duncan Dull) (July 02, 2010 8:41 AM)  Orders Added: 1)  Testosterone Cypionat 200mg  ing [J1080] 2)  Admin of Therapeutic Inj  intramuscular or subcutaneous [[id number]]

## 2010-09-05 NOTE — Miscellaneous (Signed)
Summary: testosterone inj  Clinical Lists Changes  Orders: Added new Service order of Testosterone Cypionat 200mg  ing (W0981) - Signed Added new Service order of Admin of Injection (IM/SQ) [phone]) - Signed      Medication Administration  Injection # 1:    Medication: Testosterone Cypionat 200mg  ing    Diagnosis: HYPOGONADISM (ICD-257.2)    Route: IM    Site: RUOQ gluteus    Exp Date: 12/27/2012    Lot #: [id number]    Mfr: pfizer    Patient tolerated injection without complications    Given by: Avon Gully CMA, Duncan Dull) (August 31, 2010 8:46 AM)  Orders Added: 1)  Testosterone Cypionat 200mg  ing [J1080] 2)  Admin of Injection (IM/SQ) [[id number]]

## 2010-09-28 ENCOUNTER — Encounter: Payer: Self-pay | Admitting: Family Medicine

## 2010-09-28 ENCOUNTER — Ambulatory Visit (INDEPENDENT_AMBULATORY_CARE_PROVIDER_SITE_OTHER): Payer: PRIVATE HEALTH INSURANCE

## 2010-09-28 ENCOUNTER — Telehealth (INDEPENDENT_AMBULATORY_CARE_PROVIDER_SITE_OTHER): Payer: Self-pay | Admitting: *Deleted

## 2010-09-28 DIAGNOSIS — E291 Testicular hypofunction: Secondary | ICD-10-CM

## 2010-09-29 ENCOUNTER — Encounter: Payer: Self-pay | Admitting: Family Medicine

## 2010-10-01 LAB — CONVERTED CEMR LAB
AST: 26 units/L (ref 0–37)
Albumin: 4.1 g/dL (ref 3.5–5.2)
PSA: 0.65 ng/mL (ref <=4.00)
Sex Hormone Binding: 27 nmol/L (ref 13–71)
Testosterone Free: 47 pg/mL (ref 47.0–244.0)
Testosterone: 219.5 ng/dL — ABNORMAL LOW (ref 250–890)
Total Bilirubin: 0.8 mg/dL (ref 0.3–1.2)

## 2010-10-04 ENCOUNTER — Telehealth: Payer: Self-pay | Admitting: Family Medicine

## 2010-10-04 NOTE — Assessment & Plan Note (Signed)
Summary: nurse visit-vew  Nurse Visit   Vitals Entered By: Payton Spark CMA (September 28, 2010 1:09 PM)  Allergies: No Known Drug Allergies  Medication Administration  Injection # 1:    Medication: Testosterone Cypionat 200mg  ing    Diagnosis: HYPOGONADISM (ICD-257.2)    Route: IM    Site: RUOQ gluteus    Patient tolerated injection without complications    Given by: Payton Spark CMA (September 28, 2010 1:09 PM)  Orders Added: 1)  Testosterone Cypionat 200mg  ing [J1080] 2)  Admin of Therapeutic Inj  intramuscular or subcutaneous [[id number]]   Medication Administration  Injection # 1:    Medication: Testosterone Cypionat 200mg  ing    Diagnosis: HYPOGONADISM (ICD-257.2)    Route: IM    Site: RUOQ gluteus    Patient tolerated injection without complications    Given by: Payton Spark CMA (September 28, 2010 1:09 PM)  Orders Added: 1)  Testosterone Cypionat 200mg  ing [J1080] 2)  Admin of Therapeutic Inj  intramuscular or subcutaneous [[id number]]

## 2010-10-09 NOTE — Progress Notes (Signed)
Summary: meds  Phone Note Call from Patient   Caller: Patient Call For: Dustin Gasser MD Summary of Call: Pt would like to try the testoseterone gel and would like for it to be sent to [facility] in [place]. he has scheduled a nurse visit for test injec Initial call taken by: Avon Gully CMA, Duncan Dull),  October 04, 2010 10:15 AM  Follow-up for Phone Call        I will send in gel. Can't do shot at the same time. REcheck leveli in 6 weeks and then can adjust dose n the gel dependin on his levels.  Follow-up by: Dustin Gasser MD,  October 04, 2010 10:40 AM  Additional Follow-up for Phone Call Additional follow up Details #1::        left message on pt's cell Additional Follow-up by: Avon Gully CMA, Duncan Dull),  October 04, 2010 1:31 PM    New/Updated Medications: ANDROGEL PUMP 1.25 GM/ACT (1%) GEL (TESTOSTERONE) 1 pump to each upper arm Prescriptions: ANDROGEL PUMP 1.25 GM/ACT (1%) GEL (TESTOSTERONE) 1 pump to each upper arm  #1 x 1   Entered and Authorized by:   Dustin Gasser MD   Signed by:   Dustin Gasser MD on 10/04/2010   Method used:   Printed then faxed to ...       [facility] [phone]* (retail)       [address]       [place], [place]  [address]       Ph: [phone]       Fax: [phone]   RxID:   [id number]

## 2010-10-19 ENCOUNTER — Ambulatory Visit: Payer: PRIVATE HEALTH INSURANCE

## 2010-11-06 ENCOUNTER — Other Ambulatory Visit: Payer: Self-pay | Admitting: Family Medicine

## 2010-11-06 MED ORDER — HYDROCHLOROTHIAZIDE 25 MG PO TABS: 25.0000 mg | ORAL_TABLET | Freq: Every day | ORAL | Status: DC

## 2010-11-06 NOTE — Telephone Encounter (Signed)
Pt called and states he is out of HCTZ and didn't know if he needed an appt.called and notified pt we will send in rx but does need to f/u

## 2010-11-09 ENCOUNTER — Other Ambulatory Visit: Payer: PRIVATE HEALTH INSURANCE | Admitting: Family Medicine

## 2010-11-09 DIAGNOSIS — E291 Testicular hypofunction: Secondary | ICD-10-CM

## 2010-11-11 ENCOUNTER — Telehealth: Payer: Self-pay | Admitting: Family Medicine

## 2010-11-11 NOTE — Telephone Encounter (Signed)
Called patient: Testosterone level is perfec.t continue current regimen.

## 2010-11-12 ENCOUNTER — Telehealth: Payer: Self-pay | Admitting: Family Medicine

## 2010-11-12 LAB — TESTOSTERONE, FREE, TOTAL, SHBG: Testosterone, Free: 146 pg/mL (ref 47.0–244.0)

## 2010-11-12 NOTE — Telephone Encounter (Signed)
Call patient: Testosterone levels look great continue current regimen I mean if he needs a refill on any of his medications.

## 2010-11-13 NOTE — Telephone Encounter (Signed)
Left message on vm with results  

## 2011-01-26 ENCOUNTER — Encounter: Payer: Self-pay | Admitting: Family Medicine

## 2011-02-01 ENCOUNTER — Encounter: Payer: Self-pay | Admitting: Family Medicine

## 2011-02-01 ENCOUNTER — Ambulatory Visit (INDEPENDENT_AMBULATORY_CARE_PROVIDER_SITE_OTHER): Payer: PRIVATE HEALTH INSURANCE | Admitting: Family Medicine

## 2011-02-01 VITALS — BP 138/88 | HR 61 | Resp 20 | Ht 70.0 in | Wt 253.0 lb

## 2011-02-01 DIAGNOSIS — G4700 Insomnia, unspecified: Secondary | ICD-10-CM

## 2011-02-01 DIAGNOSIS — I10 Essential (primary) hypertension: Secondary | ICD-10-CM

## 2011-02-01 DIAGNOSIS — R109 Unspecified abdominal pain: Secondary | ICD-10-CM

## 2011-02-01 DIAGNOSIS — E291 Testicular hypofunction: Secondary | ICD-10-CM

## 2011-02-01 DIAGNOSIS — R030 Elevated blood-pressure reading, without diagnosis of hypertension: Secondary | ICD-10-CM

## 2011-02-01 NOTE — Assessment & Plan Note (Signed)
We are due to recheck his labs today. His last testosterone level was in the 500s which is our goal. He was urged and possibly switching to a new testosterone 1.62%. I told him that he went on line for acute onset may get cheaper than the current one he has not be happy to switch him. He is currently happy with the results he is receiving from his testosterone.

## 2011-02-01 NOTE — Assessment & Plan Note (Addendum)
We discussed strategies to work on his sleep quality. He already does not drink caffeine. Here he goes to bed and wakes up at a consistent time. He would like to try something more natural initially. I recommended a trial of melatonin and valerian root capsules. If after 3 weeks this is not making significant improvement of the week and consider a sleep aid. We discussed the pros and cons of sleep aids. I recommend a trial of trazodone first if we do need to try something and then consider one of the other drugs like Ambien or Lunesta if needed. He denies snoring but is unlikely that he has sleep apnea.

## 2011-02-01 NOTE — Patient Instructions (Signed)
Trial of valerian root or melatonin for 2-3 week. Or can take both. If not helping then call the office and can send in a sleep aid.   Make sure going to bed and waking up at the same time to help reset sleep cycle. Don't lay in bed for more than 20 min at a time if still awake. Get up and do something boring until feel tired again and then try to lay down again.

## 2011-02-01 NOTE — Progress Notes (Signed)
  Subjective:    Patient ID: Dustin Kennedy, male    DOB: 1965-08-26, 45 y.o.   MRN: [id number]  Hypertension This is a chronic problem. The current episode started more than 1 year ago. The problem is unchanged. The problem is controlled. Pertinent negatives include no chest pain, headaches, peripheral edema or shortness of breath. There are no associated agents to hypertension. Risk factors for coronary artery disease include no known risk factors. Past treatments include diuretics. The current treatment provides mild improvement. There are no compliance problems.     Inosomnia - go to back to bed aroud 10PM and will wakes up around 1-2AM and then lays there restless the rest of night.  Using Tylenol PM and it does help him fall alseep.  Traveling a lot of his job.  No snoring unless extremely tired.  Sxs for several months. Taking the tylenol PM for abut 1 month.  Goes to bed at the same time.  Working out regularly.    Bloating-Almost weekly will have an episode of abdominal bloating and discomfort.  Says his pants will get tight spending the day. He denies any actual change in his bowels. He says usually by the next day it seems to be better. He works out very regularly and feels he has really good abdominal tone. No significant abdominal pain but just seems more comfortable. No known triggers. No known intolerance to dairy products.  Review of Systems  Respiratory: Negative for shortness of breath.   Cardiovascular: Negative for chest pain.  Neurological: Negative for headaches.       Objective:   Physical Exam  Constitutional: He is oriented to person, place, and time. He appears well-developed and well-nourished.  HENT:  Head: Normocephalic and atraumatic.  Neck: Neck supple. No thyromegaly present.  Cardiovascular: Normal rate, regular rhythm and normal heart sounds.   Pulmonary/Chest: Effort normal and breath sounds normal.  Musculoskeletal: He exhibits no edema.       Varicose vein  on his left inner calf  Lymphadenopathy:    He has no cervical adenopathy.  Neurological: He is alert and oriented to person, place, and time.  Skin: Skin is warm and dry.  Psychiatric: He has a normal mood and affect. His behavior is normal.          Assessment & Plan:  Bloating-his symptoms are somewhat mild and vague but certainly this could be consistent with a milk allergy or possibly gluten enteropathy. We discussed these as potential diagnoses. He would like to get the blood test done. Also discussed that he could consider a trial diet free of disease for one month to see if he feels better and notices less bloating and abdominal irritability. Patient says he is willing to try this at this point.

## 2011-02-01 NOTE — Assessment & Plan Note (Signed)
His blood pressure does look better today. He still on the borderline. We'll continue to monitor him every 4-6 months. He'll continue his HCTZ.

## 2011-02-02 LAB — BASIC METABOLIC PANEL WITH GFR
Calcium: 9.6 mg/dL (ref 8.4–10.5)
GFR, Est African American: >60 mL/min (ref >60)
GFR, Est Non African American: >60 mL/min (ref >60)
Potassium: 4.3 mEq/L (ref 3.5–5.3)
Sodium: 138 mEq/L (ref 135–145)

## 2011-02-04 ENCOUNTER — Telehealth: Payer: Self-pay | Admitting: Family Medicine

## 2011-02-04 LAB — TESTOSTERONE, FREE, TOTAL, SHBG
Sex Hormone Binding: 26 nmol/L (ref 13–71)
Testosterone, Free: 98.5 pg/mL (ref 47.0–244.0)
Testosterone-% Free: 2.3 % (ref 1.6–2.9)
Testosterone: 421.89 ng/dL (ref 250–890)

## 2011-02-04 MED ORDER — TESTOSTERONE 20.25 MG/ACT (1.62%) TD GEL: 40.5000 mg | Freq: Every day | TRANSDERMAL | Status: DC

## 2011-02-04 NOTE — Telephone Encounter (Signed)
Call pt: Neg celiac panel and neg mild allergy. Stil recommend trial of gluten free diet for one month and see if GI sxs improve. We can always refer him to GI as well if not seen

## 2011-02-04 NOTE — Telephone Encounter (Signed)
Call patient: Complete metabolic panel is normal except for still has a borderline glucose but this is stable. Testosterone level was at 421 today. This is still okay. Is up to him if he wants to switch to a newer version of the AndroGel will continue what he is taking. Otherwise we'll need to recheck his labs in 6 months as well as a PSA and liver function tests. I am still awaiting his additional lab testing for his abdominal discomfort.

## 2011-02-04 NOTE — Telephone Encounter (Signed)
Pt advised of results and rec. Pt states he would like to try the newer Andro-gel.

## 2011-02-05 NOTE — Telephone Encounter (Signed)
Advised pt of results and rec. 

## 2011-05-25 ENCOUNTER — Other Ambulatory Visit: Payer: Self-pay | Admitting: Family Medicine

## 2012-01-07 ENCOUNTER — Other Ambulatory Visit: Payer: Self-pay | Admitting: Family Medicine

## 2012-02-07 ENCOUNTER — Ambulatory Visit: Payer: PRIVATE HEALTH INSURANCE | Admitting: Family Medicine

## 2012-02-09 ENCOUNTER — Other Ambulatory Visit: Payer: Self-pay | Admitting: Family Medicine

## 2012-02-10 NOTE — Telephone Encounter (Signed)
Must make appointment before any mor refills

## 2012-02-11 ENCOUNTER — Other Ambulatory Visit: Payer: Self-pay | Admitting: *Deleted

## 2012-02-11 MED ORDER — HYDROCHLOROTHIAZIDE 25 MG PO TABS: 25.0000 mg | ORAL_TABLET | Freq: Every day | ORAL | Status: DC

## 2012-02-14 ENCOUNTER — Ambulatory Visit: Payer: PRIVATE HEALTH INSURANCE | Admitting: Family Medicine

## 2012-03-04 ENCOUNTER — Ambulatory Visit (INDEPENDENT_AMBULATORY_CARE_PROVIDER_SITE_OTHER): Payer: PRIVATE HEALTH INSURANCE | Admitting: Family Medicine

## 2012-03-04 ENCOUNTER — Encounter: Payer: Self-pay | Admitting: Family Medicine

## 2012-03-04 VITALS — BP 150/93 | HR 63 | Wt 247.0 lb

## 2012-03-04 DIAGNOSIS — I8390 Asymptomatic varicose veins of unspecified lower extremity: Secondary | ICD-10-CM

## 2012-03-04 DIAGNOSIS — I10 Essential (primary) hypertension: Secondary | ICD-10-CM

## 2012-03-04 DIAGNOSIS — E291 Testicular hypofunction: Secondary | ICD-10-CM

## 2012-03-04 DIAGNOSIS — Z8349 Family history of other endocrine, nutritional and metabolic diseases: Secondary | ICD-10-CM

## 2012-03-04 MED ORDER — LISINOPRIL 20 MG PO TABS: 20.0000 mg | ORAL_TABLET | Freq: Every day | ORAL | Status: DC

## 2012-03-04 MED ORDER — TESTOSTERONE 20.25 MG/ACT (1.62%) TD GEL: 2.0000 | Freq: Every morning | TRANSDERMAL | Status: DC

## 2012-03-04 NOTE — Progress Notes (Signed)
  Subjective:    Patient ID: Dustin Kennedy, male    DOB: [birth year], 46 y.o.   MRN: [id number]  HPI HTN - Often skips HCTZ bc of urinary frequency esp when travels for his job. Has a HA today. NO CP or SOB  Concerned about thyroid issues bc of family hx. he is still very concerned he may have thyroid problems. We have tested him in the past for a TSH. He specifically requests to have thyroid antibodies checked.  Hypogonadism - Needs test checked. Has been a long time. He is on androgel. He feels the medication is working well and has no side effects. Energy is good. Ability to build muscle mass is good. He works out regularly.  Varicose veins on right leg.  He says that they do tend to swell when he travels and when he works out. He denies any significant pain or burning. They're much more prominent on his right leg compared to his left. He also has some spider veins around both ankles. He wants to get these treated before they get worse.   Review of Systems     Objective:   Physical Exam  Constitutional: He is oriented to person, place, and time. He appears well-developed and well-nourished.  HENT:  Head: Normocephalic and atraumatic.  Cardiovascular: Normal rate, regular rhythm and normal heart sounds.   Pulmonary/Chest: Effort normal and breath sounds normal.  Neurological: He is alert and oriented to person, place, and time.  Skin: Skin is warm and dry.  Psychiatric: He has a normal mood and affect. His behavior is normal.          Assessment & Plan:  HTN - uncontrolled off his medication. We will certainly he stopped his HCTZ since it does cause some increased urinary frequency which is typical with travel and change to an ACE inhibitor. We'll start lisinopril 20 mg daily. Followup in 2-3 months once he is back in town to recheck blood pressure make sure the dose is adequate. Continue to work on exercise and diet. He is overall very healthy. I suspect his recent headaches are likely  from his uncontrolled blood pressure. These do not resolve with well-controlled blood pressure then please let me know.  Hypogonadism - PSA, liver and test level.  He is certainly overdue for blood work. I reminded him that he is due for this at least every 6 months since he is now stable regimen. I did go ahead and refill his AndroGel for 6 months worse.  Varicose and spider veins-discussed that the main treatment is compression stockings. He can certainly wear these with a stress likes when traveling especially when riding in airplanes or if he is up on his feet for long periods. This does help with him developing future varicose veins. Also refer him to the vein a vascular clinic for treatment of his current varicose veins.  Family history thyroid disease-patient still concerned because he does have difficulty maintaining a healthy weight. We will recheck TSH, T4, T3 and antithyroid antibodies.

## 2012-03-04 NOTE — Patient Instructions (Signed)
We will call you with your lab results. If you don't here from us in about a week then please give us a call at [phone].  

## 2012-03-06 ENCOUNTER — Telehealth: Payer: Self-pay | Admitting: *Deleted

## 2012-03-06 LAB — LIPID PANEL
HDL: 41 mg/dL (ref >39)
LDL Cholesterol: 102 mg/dL — ABNORMAL HIGH (ref 0–99)
Total CHOL/HDL Ratio: 4 Ratio
Triglycerides: 94 mg/dL (ref <150)

## 2012-03-06 LAB — COMPLETE METABOLIC PANEL WITH GFR
ALT: 21 U/L (ref 0–53)
Alkaline Phosphatase: 65 U/L (ref 39–117)
CO2: 29 mEq/L (ref 19–32)
Creat: 1.23 mg/dL (ref 0.50–1.35)
GFR, Est African American: 80 mL/min
Sodium: 141 mEq/L (ref 135–145)
Total Bilirubin: 1.2 mg/dL (ref 0.3–1.2)

## 2012-03-06 LAB — T3, FREE: T3, Free: 3.3 pg/mL (ref 2.3–4.2)

## 2012-03-06 NOTE — Telephone Encounter (Signed)
Pt states that he went to have lab work done today and states that when you call him with his results he wants you to call his cell phone and not home phone.

## 2012-03-09 LAB — TESTOSTERONE, FREE, TOTAL, SHBG
Sex Hormone Binding: 31 nmol/L (ref 13–71)
Testosterone, Free: 64.5 pg/mL (ref 47.0–244.0)

## 2012-04-20 DIAGNOSIS — I872 Venous insufficiency (chronic) (peripheral): Secondary | ICD-10-CM | POA: Insufficient documentation

## 2012-06-09 ENCOUNTER — Other Ambulatory Visit: Payer: Self-pay | Admitting: Family Medicine

## 2012-07-07 ENCOUNTER — Other Ambulatory Visit: Payer: Self-pay | Admitting: Family Medicine

## 2012-07-24 ENCOUNTER — Encounter: Payer: Self-pay | Admitting: Family Medicine

## 2012-07-24 ENCOUNTER — Ambulatory Visit (INDEPENDENT_AMBULATORY_CARE_PROVIDER_SITE_OTHER): Payer: PRIVATE HEALTH INSURANCE | Admitting: Family Medicine

## 2012-07-24 VITALS — BP 121/63 | HR 88 | Resp 16 | Ht 70.0 in | Wt 257.0 lb

## 2012-07-24 DIAGNOSIS — E669 Obesity, unspecified: Secondary | ICD-10-CM

## 2012-07-24 DIAGNOSIS — E663 Overweight: Secondary | ICD-10-CM

## 2012-07-24 DIAGNOSIS — E291 Testicular hypofunction: Secondary | ICD-10-CM

## 2012-07-24 DIAGNOSIS — M25519 Pain in unspecified shoulder: Secondary | ICD-10-CM

## 2012-07-24 DIAGNOSIS — M25512 Pain in left shoulder: Secondary | ICD-10-CM

## 2012-07-24 DIAGNOSIS — Z23 Encounter for immunization: Secondary | ICD-10-CM

## 2012-07-24 MED ORDER — TESTOSTERONE 10 MG/ACT (2%) TD GEL: 2.0000 | TRANSDERMAL | Status: DC

## 2012-07-24 MED ORDER — MELOXICAM 7.5 MG PO TABS: 7.5000 mg | ORAL_TABLET | Freq: Every day | ORAL | Status: DC

## 2012-07-24 NOTE — Patient Instructions (Addendum)
Please schedule an appt with Dr. Karie Schwalbe for left shoulder injury.   Go for lab work in about one month after changing to the [place] gel. Followup with me in 4 months.

## 2012-07-24 NOTE — Progress Notes (Addendum)
  Subjective:    Patient ID: Dustin Kennedy, male    DOB: 1965/08/11, 46 y.o.   MRN: [id number]  HPI Hypogonadism - F/U from 4 months.  He is tolerating medication well without any side effects. No significant mood changes. He is most frustrated with his weight. Says has gained some weight as well. Wasn't happy with his levels back in [place]. He is still working out in August.  Drinks lots of water. Has cut out tea of soda. 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   Left shoulder pain - hx of bone spurs and surgery on that shoulder years ago.  Says now getting more pain and dec ROM.  Hearing cracking in the shoulder. Not taking any pain meds for it.  He is having to alter  His work out routine. Has been avoiding doing any heavy weight lifting. He says it got worse initially after bench pressing 300 pounds. No other alleviating symptoms. Certain motions are painful such as trying to put on a shirt sweater.    Review of Systems     Objective:   Physical Exam  Constitutional: He is oriented to person, place, and time. He appears well-developed and well-nourished.  Musculoskeletal:       Left shoulder with decreased range of motion. He is only able to extend to about 45 laterally. He can extend to about 90 forward. He can touch his low back but is unable to push off. I am able to lift his shoulder to about 120 before he has significant pain. He is tender over the edge of the acromion. Nontender over the clavicle or scapula. No significant swelling. Old surgical scar well-healed. Positive empty can test on the left.  Neurological: He is alert and oriented to person, place, and time.  Skin: Skin is warm and dry.  Psychiatric: He has a normal mood and affect. His behavior is normal.          Assessment & Plan:  Hypogonadism - Discussed changing to fortesta since he travels a lot. New rx sent and coupon card given. Recommend he go for followup labs including repeat testosterone and PSA in about one month  since we are changing his form of testosterone.  Obesity, BMI >30 -continue work on diet and exercise. Unfortunately has been traveling a lot and this is made it difficult for him to eat as healthy as he would like. Hopefully he can get back on track and begin to continue to work on his weight. He did have a normal thyroid level back in August.  Left shoulder pain - discussed i really think it is his rotator cuff. I discussed this with him. Handout given on exercises. Also recommend an anti-inflammatory trial for least a week until we are able to get him in with partner Dr. Rodney Langton for further evaluation. Prescription sent for Mobic 7.5 mg daily. Also recommend x-rays because of his history of surgery and bone spurs. He says he will wait until he sees Dr. Benjamin Stain for these.

## 2012-07-31 ENCOUNTER — Encounter: Payer: Self-pay | Admitting: Sports Medicine

## 2012-07-31 ENCOUNTER — Ambulatory Visit (INDEPENDENT_AMBULATORY_CARE_PROVIDER_SITE_OTHER): Payer: PRIVATE HEALTH INSURANCE | Admitting: Sports Medicine

## 2012-07-31 VITALS — BP 138/83 | HR 65 | Wt 258.0 lb

## 2012-07-31 DIAGNOSIS — M67919 Unspecified disorder of synovium and tendon, unspecified shoulder: Secondary | ICD-10-CM

## 2012-07-31 DIAGNOSIS — M75100 Unspecified rotator cuff tear or rupture of unspecified shoulder, not specified as traumatic: Secondary | ICD-10-CM

## 2012-07-31 DIAGNOSIS — M7540 Impingement syndrome of unspecified shoulder: Secondary | ICD-10-CM

## 2012-07-31 DIAGNOSIS — M25512 Pain in left shoulder: Secondary | ICD-10-CM

## 2012-07-31 DIAGNOSIS — M19019 Primary osteoarthritis, unspecified shoulder: Secondary | ICD-10-CM

## 2012-07-31 DIAGNOSIS — E669 Obesity, unspecified: Secondary | ICD-10-CM | POA: Insufficient documentation

## 2012-07-31 DIAGNOSIS — M75102 Unspecified rotator cuff tear or rupture of left shoulder, not specified as traumatic: Secondary | ICD-10-CM | POA: Insufficient documentation

## 2012-07-31 DIAGNOSIS — IMO0002 Reserved for concepts with insufficient information to code with codable children: Secondary | ICD-10-CM

## 2012-07-31 NOTE — Assessment & Plan Note (Addendum)
He is status post a single arthroscopic subacromial decompression. Symptoms today are predominantly acromioclavicular as well as impingement related. Luckily all muscles of his rotator cuff appeared intact on ultrasound. I injected his acromioclavicular joint as well as subacromial bursa under guidance. He will do formal physical therapy, and come back to see me in 4 weeks. I advised him to avoid all overhead exercises in the gym, and decrease his bench press for now.

## 2012-07-31 NOTE — Progress Notes (Addendum)
SPORTS MEDICINE CONSULTATION REPORT  Subjective:    I'm seeing this patient as a consultation for:  Dr. Linford Arnold  CC: Left shoulder pain  HPI: This is a very pleasant 47 year old male comes in with a several month history of pain that he localizes over the left a.c. joint, and over the left deltoid with abduction and cross arm type motions. He has a history of an arthroscopic subacromial decompression without rotator cuff repair. He recalls attempting a 300 pound bench press, and having pain afterwards particularly with overhead motions. It does not radiate down, it is severe, it does wake him up from sleep. He denies any neck pain, and numbness and tingling in his hands. He has tried some low-dose MOBIC but no rehabilitation exercises.  Past medical history, Surgical history, Family history not pertinant except as noted below, Social history, Allergies, and medications have been entered into the medical record, reviewed, and no changes needed.   Review of Systems: No headache, visual changes, nausea, vomiting, diarrhea, constipation, dizziness, abdominal pain, skin rash, fevers, chills, night sweats, weight loss, swollen lymph nodes, body aches, joint swelling, muscle aches, chest pain, shortness of breath, mood changes, visual or auditory hallucinations.   Objective:   Vitals:  Afebrile, vital signs stable. General: Well Developed, well nourished, and in no acute distress.  Neuro/Psych: Alert and oriented x3, extra-ocular muscles intact, able to move all 4 extremities.  Skin: Warm and dry, no rashes noted.  Respiratory: Not using accessory muscles, speaking in full sentences, trachea midline.  Cardiovascular: Pulses palpable, no extremity edema. Abdomen: Does not appear distended. Left Shoulder: Inspection reveals no abnormalities, atrophy or asymmetry. Mild tenderness to palpation over a.c. joint. Range of motion limited in abduction. Rotator cuff strength weak to abduction. Positive  Neer's, Hawkin's, and Empty Can sites. Speeds and Yergason's tests normal. No labral pathology noted with negative Obrien's, negative clunk and good stability. Normal scapular function observed. No painful arc and no drop arm sign. No apprehension sign Positive cross arm test.  Procedure: Real-time Ultrasound Guided Injection of left acromioclavicular joint Device: GE Logiq E  Ultrasound guided injection is preferred based studies that show increased duration, increased effect, greater accuracy, decreased procedural pain, increased response rate, and decreased cost with ultrasound guided versus blind injection.  Verbal informed consent obtained.  Time-out conducted.  Noted no overlying erythema, induration, or other signs of local infection.  Skin prepped in a sterile fashion.  Local anesthesia: Topical Ethyl chloride.  With sterile technique and under real time ultrasound guidance:  0.5 cc Kenalog 40, 1 cc lidocaine injected into joint with a 25-gauge needle. Completed without difficulty  Pain immediately resolved suggesting accurate placement of the medication.  Advised to call if fevers/chills, erythema, induration, drainage, or persistent bleeding.  Images permanently stored and available for review in the ultrasound unit.  Impression: Technically successful ultrasound guided injection.  Procedure: Real-time Ultrasound Guided Injection of left subacromial bursa Device: GE Logiq E  Ultrasound guided injection is preferred based studies that show increased duration, increased effect, greater accuracy, decreased procedural pain, increased response rate, and decreased cost with ultrasound guided versus blind injection.  Verbal informed consent obtained.  Time-out conducted.  Noted no overlying erythema, induration, or other signs of local infection.  Skin prepped in a sterile fashion.  Local anesthesia: Topical Ethyl chloride.  With sterile technique and under real time ultrasound  guidance:  Supraspinatus, biceps tendon, subscapularis, infraspinatus, teres minor, glenohumeral joint all visualized and intact without any sign of tears or  retraction. He does have marked at subacromial bursitis visible. A 25-gauge needle was inserted into the bursa and 1 cc Kenalog 40, 4 cc lidocaine injected. Completed without difficulty  Pain immediately resolved suggesting accurate placement of the medication.  Advised to call if fevers/chills, erythema, induration, drainage, or persistent bleeding.  Images permanently stored and available for review in the ultrasound unit.  Impression: Technically successful ultrasound guided injection.  Impression and Recommendations:   This case required medical decision making of moderate complexity.

## 2012-07-31 NOTE — Addendum Note (Signed)
Addended by: Nani Gasser D on: 07/31/2012 08:39 AM   Modules accepted: Level of Service

## 2012-08-05 ENCOUNTER — Ambulatory Visit: Payer: PRIVATE HEALTH INSURANCE | Admitting: Physical Therapy

## 2012-08-07 ENCOUNTER — Other Ambulatory Visit: Payer: Self-pay | Admitting: Family Medicine

## 2012-08-07 ENCOUNTER — Encounter: Payer: PRIVATE HEALTH INSURANCE | Admitting: Physical Therapy

## 2012-08-12 ENCOUNTER — Other Ambulatory Visit: Payer: Self-pay | Admitting: Family Medicine

## 2012-08-12 DIAGNOSIS — M19019 Primary osteoarthritis, unspecified shoulder: Secondary | ICD-10-CM | POA: Insufficient documentation

## 2012-08-12 MED ORDER — LISINOPRIL 20 MG PO TABS: 20.0000 mg | ORAL_TABLET | Freq: Every day | ORAL | Status: DC

## 2012-08-12 NOTE — Assessment & Plan Note (Signed)
Injection as above 

## 2012-09-01 ENCOUNTER — Encounter: Payer: Self-pay | Admitting: Family Medicine

## 2012-09-04 ENCOUNTER — Ambulatory Visit: Payer: PRIVATE HEALTH INSURANCE | Admitting: Sports Medicine

## 2012-09-11 ENCOUNTER — Telehealth: Payer: Self-pay

## 2012-09-11 ENCOUNTER — Ambulatory Visit: Payer: PRIVATE HEALTH INSURANCE | Admitting: Sports Medicine

## 2012-09-11 MED ORDER — AXIRON 30 MG/ACT TD SOLN: 2.0000 | Freq: Every morning | TRANSDERMAL | Status: DC

## 2012-09-11 MED ORDER — TESTOSTERONE 30 MG/ACT TD SOLN: 1.0000 | Freq: Every morning | TRANSDERMAL | Status: DC

## 2012-09-11 NOTE — Telephone Encounter (Signed)
rx printed

## 2012-09-11 NOTE — Telephone Encounter (Addendum)
He would like to try Axiron.

## 2012-10-13 ENCOUNTER — Other Ambulatory Visit: Payer: Self-pay | Admitting: Family Medicine

## 2012-11-12 ENCOUNTER — Other Ambulatory Visit: Payer: Self-pay | Admitting: Family Medicine

## 2012-11-20 ENCOUNTER — Ambulatory Visit (INDEPENDENT_AMBULATORY_CARE_PROVIDER_SITE_OTHER): Payer: PRIVATE HEALTH INSURANCE | Admitting: Family Medicine

## 2012-11-20 ENCOUNTER — Encounter: Payer: Self-pay | Admitting: Family Medicine

## 2012-11-20 VITALS — BP 126/80 | HR 58 | Ht 70.0 in | Wt 253.0 lb

## 2012-11-20 DIAGNOSIS — R7301 Impaired fasting glucose: Secondary | ICD-10-CM

## 2012-11-20 DIAGNOSIS — Z23 Encounter for immunization: Secondary | ICD-10-CM

## 2012-11-20 DIAGNOSIS — E291 Testicular hypofunction: Secondary | ICD-10-CM

## 2012-11-20 LAB — COMPLETE METABOLIC PANEL WITH GFR
AST: 27 U/L (ref 0–37)
Albumin: 4.1 g/dL (ref 3.5–5.2)
Alkaline Phosphatase: 67 U/L (ref 39–117)
BUN: 16 mg/dL (ref 6–23)
Calcium: 9.5 mg/dL (ref 8.4–10.5)
Chloride: 104 mEq/L (ref 96–112)
Creat: 1.28 mg/dL (ref 0.50–1.35)
GFR, Est Non African American: 66 mL/min
Glucose, Bld: 95 mg/dL (ref 70–99)
Potassium: 4.5 mEq/L (ref 3.5–5.3)

## 2012-11-20 LAB — POCT GLYCOSYLATED HEMOGLOBIN (HGB A1C): Hemoglobin A1C: 5.1

## 2012-11-20 NOTE — Progress Notes (Signed)
  Subjective:    Patient ID: Dustin Kennedy, male    DOB: 1966-02-16, 47 y.o.   MRN: [id number]  HPI Hypogonadism.  -here to follow up on Axiron. He was doing much better on the axiron compared to Androgel which caused some skin irritation. No S.E.  No irritablil\ty. Energy level is good.    HTN-  Pt denies chest pain, SOB, dizziness, or heart palpitations.  Taking meds as directed w/o problems.  Denies medication side effects.      Review of Systems     Objective:   Physical Exam  Constitutional: He is oriented to person, place, and time. He appears well-developed and well-nourished.  HENT:  Head: Normocephalic and atraumatic.  Cardiovascular: Normal rate, regular rhythm and normal heart sounds.   Pulmonary/Chest: Effort normal and breath sounds normal.  Neurological: He is alert and oriented to person, place, and time.  Skin: Skin is warm and dry.  Psychiatric: He has a normal mood and affect. His behavior is normal.          Assessment & Plan:  Hypogonadism - Tolerating Axiron well. Check psa and testosterone. F/U in 4-6 months    HTN- Well conrolled on ACE.  F/U in 6 months.

## 2012-11-23 LAB — TESTOSTERONE, FREE, TOTAL, SHBG: Sex Hormone Binding: 25 nmol/L (ref 13–71)

## 2012-12-15 ENCOUNTER — Other Ambulatory Visit: Payer: Self-pay | Admitting: Family Medicine

## 2012-12-30 ENCOUNTER — Encounter: Payer: Self-pay | Admitting: Family Medicine

## 2013-01-03 ENCOUNTER — Other Ambulatory Visit: Payer: Self-pay | Admitting: Family Medicine

## 2013-01-04 ENCOUNTER — Telehealth: Payer: Self-pay | Admitting: *Deleted

## 2013-01-04 MED ORDER — TESTOSTERONE 20.25 MG/ACT (1.62%) TD GEL: 2.0000 | Freq: Every morning | TRANSDERMAL | Status: DC

## 2013-01-04 NOTE — Telephone Encounter (Signed)
Called pt to see where he would like for his Rx to be sent to. [facility] in [place]. rx faxed.Marland KitchenMarland KitchenLoralee Pacas [place]

## 2013-01-18 ENCOUNTER — Other Ambulatory Visit: Payer: Self-pay | Admitting: Family Medicine

## 2013-01-20 ENCOUNTER — Encounter: Payer: Self-pay | Admitting: Family Medicine

## 2013-02-18 ENCOUNTER — Telehealth: Payer: Self-pay | Admitting: *Deleted

## 2013-02-18 NOTE — Telephone Encounter (Signed)
Prior auth obtained from [facility] for androgel pump. Approval good 01/05/13-08/20/13.

## 2013-02-22 ENCOUNTER — Encounter: Payer: Self-pay | Admitting: Family Medicine

## 2013-03-22 ENCOUNTER — Other Ambulatory Visit: Payer: Self-pay | Admitting: Family Medicine

## 2013-06-03 ENCOUNTER — Other Ambulatory Visit: Payer: Self-pay

## 2013-06-21 ENCOUNTER — Other Ambulatory Visit: Payer: Self-pay | Admitting: Family Medicine

## 2013-07-22 ENCOUNTER — Other Ambulatory Visit: Payer: Self-pay | Admitting: Family Medicine

## 2013-09-02 ENCOUNTER — Other Ambulatory Visit: Payer: Self-pay | Admitting: Family Medicine

## 2013-10-08 ENCOUNTER — Encounter: Payer: Commercial Managed Care - PPO | Admitting: Family Medicine

## 2013-10-14 ENCOUNTER — Encounter: Payer: Commercial Managed Care - PPO | Admitting: Family Medicine

## 2013-10-26 ENCOUNTER — Encounter: Payer: Self-pay | Admitting: Family Medicine

## 2013-10-26 ENCOUNTER — Ambulatory Visit (INDEPENDENT_AMBULATORY_CARE_PROVIDER_SITE_OTHER): Payer: Commercial Managed Care - PPO | Admitting: Family Medicine

## 2013-10-26 VITALS — BP 138/80 | HR 68 | Ht 70.0 in | Wt 261.0 lb

## 2013-10-26 DIAGNOSIS — Z0000 Encounter for general adult medical examination without abnormal findings: Secondary | ICD-10-CM

## 2013-10-26 DIAGNOSIS — R635 Abnormal weight gain: Secondary | ICD-10-CM

## 2013-10-26 DIAGNOSIS — R7301 Impaired fasting glucose: Secondary | ICD-10-CM

## 2013-10-26 LAB — POCT GLYCOSYLATED HEMOGLOBIN (HGB A1C): Hemoglobin A1C: 5.3

## 2013-10-26 NOTE — Progress Notes (Signed)
Subjective:    Patient ID: Dustin Kennedy, male    DOB: 11/19/1965, 48 y.o.   MRN: [id number]  HPI  Here for CPE today.  Has gained some weight, hs been working out but has been traveling a lot this last. Year. He i very upset that he is gaining weight. He works out from us in our 5 days per week. He feels that he eats very healthy. He just doesn't understand why he gains weight. She's a little bit frustrated with this. He does have a history hypergonadism but is currently not being treated.     Review of Systems Comprehensive ROS is negative.   BP 138/80  Pulse 68  Ht 5\' 10"  (1.778 m)  Wt 261 lb (118.389 kg)  BMI 37.45 kg/m2    No Known Allergies  Past Medical History  Diagnosis Date  . Hypertension   . Hyperlipidemia     Past Surgical History  Procedure Laterality Date  . Lt shoulder surgery  1997    for bone spurs  . Lipomas removal      removal X 4    History   Social History  . Marital Status: Married    Spouse Name: N/A    Number of Children: N/A  . Years of Education: N/A   Occupational History  . Not on file.   Social History Main Topics  . Smoking status: Never Smoker   . Smokeless tobacco: Not on file  . Alcohol Use: Yes  . Drug Use: No  . 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   Other Topics Concern  . Not on file   Social History Narrative  . No narrative on file    Family History  Problem Relation Age of Onset  . Hypertension      family history  . Hyperlipidemia      family history  . Thyroid disease      family history  . Stroke      family history    Outpatient Encounter Prescriptions as of 10/26/2013  Medication Sig  . AMBULATORY NON FORMULARY MEDICATION Take 1 packet by mouth daily. Medication Name: [employer] Mega mens's multivitamin  . [DISCONTINUED] CREATINE PO Take by mouth.  .  [DISCONTINUED] Flaxseed, Linseed, (FLAX SEED OIL) 1000 MG CAPS Take 1,000 mg by mouth. Take two capsules daily.   . [DISCONTINUED] lisinopril (PRINIVIL,ZESTRIL) 20 MG tablet TAKE 1 TABLET BY MOUTH EVERY DAY  . [DISCONTINUED] meloxicam (MOBIC) 7.5 MG tablet TAKE 1 TABLET BY MOUTH EVERY DAY  . [DISCONTINUED] Testosterone (ANDROGEL PUMP) 20.25 MG/ACT (1.62%) GEL Place 2 Act onto the skin every morning.          Objective:   Physical Exam  Constitutional: He is oriented to person, place, and time. He appears well-developed and well-nourished.  HENT:  Head: Normocephalic and atraumatic.  Right Ear: External ear normal.  Left Ear: External ear normal.  Nose: Nose normal.  Mouth/Throat: Oropharynx is clear and moist.  Eyes: Conjunctivae and EOM are normal. Pupils are equal, round, and reactive to light.  Neck: Normal range of motion. Neck supple. No thyromegaly present.  Cardiovascular: Normal rate, regular rhythm, normal heart sounds and intact distal pulses.   Pulmonary/Chest: Effort normal and breath sounds normal.  Abdominal: Soft. Bowel sounds are normal. He exhibits no distension and no mass. There is no tenderness.  There is no rebound and no guarding.  Musculoskeletal: Normal range of motion.  Lymphadenopathy:    He has no cervical adenopathy.  Neurological: He is alert and oriented to person, place, and time. He has normal reflexes.  Skin: Skin is warm and dry.  Psychiatric: He has a normal mood and affect. His behavior is normal. Judgment and thought content normal.          Assessment & Plan:  CPE Keep up a regular exercise program and make sure you are eating a healthy diet Try to eat 4 servings of dairy a day, or if you are lactose intolerant take a calcium with vitamin D daily.  Your vaccines are up to date.  Due for CMP fasting lipid panel, PSA.  Obesity/BMI 37 - he has gained her weight in the last year. We'll check thyroid levels. He has a lot of difficulty with  his weight even though he works out from [place] in our 5 days per week. I discussed using a Smart phone application call my [employer] to help him track calories. Explained to him even a small amount of her weight he is burning daily and affect his overall weight gain over a long period of time. We also discussed that we could potentially look at weight loss medication such as phentermine if his thyroid level is normal. We discussed the pros and cons of this medication pinch side effects. He will need followup monthly for blood pressure and weight checks if we decide to start it.

## 2013-10-26 NOTE — Patient Instructions (Signed)
Phentermine tablets or capsules What is this medicine? PHENTERMINE (FEN ter meen) decreases your appetite. It is used with a reduced calorie diet and exercise to help you lose weight. This medicine may be used for other purposes; ask your health care provider or pharmacist if you have questions. COMMON BRAND NAME(S): Adipex-P, Atti-Plex P , Atti-Plex P Spansule , Fastin, Pro-Fast, Tara-8  What should I tell my health care provider before I take this medicine? They need to know if you have any of these conditions: -agitation -glaucoma -heart disease -high blood pressure -history of substance abuse -lung disease called Primary Pulmonary Hypertension (PPH) -taken an MAOI like Carbex, Eldepryl, Marplan, Nardil, or Parnate in last 14 days -thyroid disease -an unusual or allergic reaction to phentermine, other medicines, foods, dyes, or preservatives -pregnant or trying to get pregnant -breast-feeding How should I use this medicine? Take this medicine by mouth with a glass of water. Follow the directions on the prescription label. This medicine is usually taken 30 minutes before or 1 to 2 hours after breakfast. Avoid taking this medicine in the evening. It may interfere with sleep. Take your doses at regular intervals. Do not take your medicine more often than directed. Talk to your pediatrician regarding the use of this medicine in children. Special care may be needed. Overdosage: If you think you have taken too much of this medicine contact a poison control center or emergency room at once. NOTE: This medicine is only for you. Do not share this medicine with others. What if I miss a dose? If you miss a dose, take it as soon as you can. If it is almost time for your next dose, take only that dose. Do not take double or extra doses. What may interact with this medicine? Do not take this medicine with any of the following medications: -duloxetine -MAOIs like Carbex, Eldepryl, Marplan, Nardil,  and Parnate -medicines for colds or breathing difficulties like pseudoephedrine or phenylephrine -procarbazine -sibutramine -SSRIs like citalopram, escitalopram, fluoxetine, fluvoxamine, paroxetine, and sertraline -stimulants like dexmethylphenidate, methylphenidate or modafinil -venlafaxine This medicine may also interact with the following medications: -medicines for diabetes This list may not describe all possible interactions. Give your health care provider a list of all the medicines, herbs, non-prescription drugs, or dietary supplements you use. Also tell them if you smoke, drink alcohol, or use illegal drugs. Some items may interact with your medicine. What should I watch for while using this medicine? Notify your physician immediately if you become short of breath while doing your normal activities. Do not take this medicine within 6 hours of bedtime. It can keep you from getting to sleep. Avoid drinks that contain caffeine and try to stick to a regular bedtime every night. This medicine was intended to be used in addition to a healthy diet and exercise. The best results are achieved this way. This medicine is only indicated for short-term use. Eventually your weight loss may level out. At that point, the drug will only help you maintain your new weight. Do not increase or in any way change your dose without consulting your doctor. You may get drowsy or dizzy. Do not drive, use machinery, or do anything that needs mental alertness until you know how this medicine affects you. Do not stand or sit up quickly, especially if you are an older patient. This reduces the risk of dizzy or fainting spells. Alcohol may increase dizziness and drowsiness. Avoid alcoholic drinks. What side effects may I notice from receiving this medicine?   Side effects that you should report to your doctor or health care professional as soon as possible: -chest pain, palpitations -depression or severe changes in  mood -increased blood pressure -irritability -nervousness or restlessness -severe dizziness -shortness of breath -problems urinating -unusual swelling of the legs -vomiting Side effects that usually do not require medical attention (report to your doctor or health care professional if they continue or are bothersome): -blurred vision or other eye problems -changes in sexual ability or desire -constipation or diarrhea -difficulty sleeping -dry mouth or unpleasant taste -headache -nausea This list may not describe all possible side effects. Call your doctor for medical advice about side effects. You may report side effects to FDA at 1-800-FDA-1088. Where should I keep my medicine? Keep out of the reach of children. This medicine can be abused. Keep your medicine in a safe place to protect it from theft. Do not share this medicine with anyone. Selling or giving away this medicine is dangerous and against the law. Store at room temperature between 20 and 25 degrees C (68 and 77 degrees F). Keep container tightly closed. Throw away any unused medicine after the expiration date. NOTE: This sheet is a summary. It may not cover all possible information. If you have questions about this medicine, talk to your doctor, pharmacist, or health care provider.  2014, Elsevier/Gold Standard. (2010-08-29 11:02:44)  

## 2013-10-29 ENCOUNTER — Other Ambulatory Visit: Payer: Self-pay | Admitting: Family Medicine

## 2013-10-29 LAB — COMPLETE METABOLIC PANEL WITH GFR
ALBUMIN: 4.2 g/dL (ref 3.5–5.2)
ALK PHOS: 67 U/L (ref 39–117)
ALT: 61 U/L — ABNORMAL HIGH (ref 0–53)
AST: 42 U/L — ABNORMAL HIGH (ref 0–37)
BUN: 15 mg/dL (ref 6–23)
CO2: 25 mEq/L (ref 19–32)
CREATININE: 1.05 mg/dL (ref 0.50–1.35)
Calcium: 9.3 mg/dL (ref 8.4–10.5)
Chloride: 99 mEq/L (ref 96–112)
GFR, EST NON AFRICAN AMERICAN: 84 mL/min
GFR, Est African American: >89 mL/min
GLUCOSE: 103 mg/dL — AB (ref 70–99)
POTASSIUM: 4.4 meq/L (ref 3.5–5.3)
Sodium: 132 mEq/L — ABNORMAL LOW (ref 135–145)
Total Bilirubin: 1.1 mg/dL (ref 0.2–1.2)
Total Protein: 6.7 g/dL (ref 6.0–8.3)

## 2013-10-29 LAB — LIPID PANEL
Cholesterol: 169 mg/dL (ref 0–200)
HDL: 46 mg/dL (ref >39)
LDL CALC: 100 mg/dL — AB (ref 0–99)
TRIGLYCERIDES: 117 mg/dL (ref <150)
Total CHOL/HDL Ratio: 3.7 Ratio
VLDL: 23 mg/dL (ref 0–40)

## 2013-10-29 LAB — TSH: TSH: 1.349 u[IU]/mL (ref 0.350–4.500)

## 2013-10-30 LAB — PSA: PSA: 0.81 ng/mL (ref <=4.00)

## 2013-11-01 LAB — HEPATIC FUNCTION PANEL
ALT: 63 U/L — AB (ref 0–53)
AST: 44 U/L — ABNORMAL HIGH (ref 0–37)
Albumin: 4.1 g/dL (ref 3.5–5.2)
Alkaline Phosphatase: 66 U/L (ref 39–117)
Bilirubin, Direct: 0.2 mg/dL (ref 0.0–0.3)
Indirect Bilirubin: 0.8 mg/dL (ref 0.2–1.2)
TOTAL PROTEIN: 6.8 g/dL (ref 6.0–8.3)
Total Bilirubin: 1 mg/dL (ref 0.2–1.2)

## 2013-11-04 ENCOUNTER — Encounter: Payer: Self-pay | Admitting: Family Medicine

## 2013-11-09 ENCOUNTER — Other Ambulatory Visit: Payer: Self-pay | Admitting: Family Medicine

## 2013-11-09 ENCOUNTER — Telehealth: Payer: Self-pay | Admitting: *Deleted

## 2013-11-09 DIAGNOSIS — R945 Abnormal results of liver function studies: Secondary | ICD-10-CM

## 2013-11-09 NOTE — Telephone Encounter (Signed)
Pt called and told of the incorrect order and that he will not be charged for this. Also he will need to come back to have correct lab drawn. Pt voiced understanding and agreed. Order faxed .Deno Etienneonya L Obdulia Steier

## 2013-11-09 NOTE — Telephone Encounter (Signed)
[facility] spoke w/Neeka and asked that she have them send bill for add on Hepatic function to [id number] to us due to our error incorrect lab ordered. She noted this information.Dustin Kennedy

## 2013-11-16 ENCOUNTER — Other Ambulatory Visit: Payer: Self-pay | Admitting: Family Medicine

## 2013-11-16 LAB — HEPATITIS PANEL, ACUTE
HCV AB: NEGATIVE
HEP B C IGM: NONREACTIVE
HEP B S AG: NEGATIVE
Hep A IgM: NONREACTIVE

## 2013-11-16 MED ORDER — PHENTERMINE HCL 37.5 MG PO TABS: 37.5000 mg | ORAL_TABLET | Freq: Every day | ORAL | Status: DC

## 2013-11-30 ENCOUNTER — Encounter: Payer: Self-pay | Admitting: Family Medicine

## 2013-12-24 ENCOUNTER — Encounter: Payer: Self-pay | Admitting: *Deleted

## 2013-12-24 ENCOUNTER — Ambulatory Visit (INDEPENDENT_AMBULATORY_CARE_PROVIDER_SITE_OTHER): Payer: Commercial Managed Care - PPO | Admitting: Physician Assistant

## 2013-12-24 ENCOUNTER — Ambulatory Visit: Payer: Self-pay | Admitting: Family Medicine

## 2013-12-24 VITALS — BP 137/75 | HR 68 | Ht 70.0 in | Wt 251.0 lb

## 2013-12-24 DIAGNOSIS — E669 Obesity, unspecified: Secondary | ICD-10-CM

## 2013-12-24 DIAGNOSIS — R635 Abnormal weight gain: Secondary | ICD-10-CM

## 2013-12-24 MED ORDER — PHENTERMINE HCL 37.5 MG PO TABS: 37.5000 mg | ORAL_TABLET | Freq: Every day | ORAL | Status: DC

## 2013-12-24 NOTE — Progress Notes (Addendum)
Patient doing well on appetite suppressant. Here for nurse visit, weight, BP, HR check. Denies problems with insomnia, heart palpitations or tremors. Satisfied with weight loss thus far and is working on [employer] and regular exercise.  Deno Etienne   Down 10lbs. BP and HR stable. Will refill and have recheck in 1 month. Tandy Gaw PA-C

## 2014-01-23 ENCOUNTER — Other Ambulatory Visit: Payer: Self-pay | Admitting: Physician Assistant

## 2014-01-24 ENCOUNTER — Other Ambulatory Visit: Payer: Self-pay | Admitting: *Deleted

## 2014-01-24 ENCOUNTER — Other Ambulatory Visit: Payer: Self-pay | Admitting: Physician Assistant

## 2014-01-24 MED ORDER — PHENTERMINE HCL 37.5 MG PO TABS: 37.5000 mg | ORAL_TABLET | Freq: Every day | ORAL | Status: DC

## 2014-01-24 NOTE — Progress Notes (Signed)
Please send faxed rx to :  [facility], [place]

## 2014-02-08 ENCOUNTER — Encounter: Payer: Self-pay | Admitting: Family Medicine

## 2014-02-18 ENCOUNTER — Ambulatory Visit (INDEPENDENT_AMBULATORY_CARE_PROVIDER_SITE_OTHER): Payer: Commercial Managed Care - PPO | Admitting: Family Medicine

## 2014-02-18 ENCOUNTER — Encounter: Payer: Self-pay | Admitting: Family Medicine

## 2014-02-18 VITALS — BP 140/81 | HR 78 | Ht 70.0 in | Wt 243.0 lb

## 2014-02-18 DIAGNOSIS — R635 Abnormal weight gain: Secondary | ICD-10-CM

## 2014-02-18 MED ORDER — PHENTERMINE HCL 37.5 MG PO TABS: 37.5000 mg | ORAL_TABLET | Freq: Every day | ORAL | Status: DC

## 2014-02-18 NOTE — Progress Notes (Signed)
Patient doing well on appetite suppressant. Here for nurse visit, weight, BP, HR check. Denies problems with insomnia, heart palpitations or tremors. Satisfied with weight loss thus far and is working on healthy diet and regular exercise.   

## 2014-02-18 NOTE — Progress Notes (Signed)
   Subjective:    Patient ID: Dustin Kennedy, male    DOB: 07/10/1966, 48 y.o.   MRN: [id number]  HPI    Review of Systems     Objective:   Physical Exam        Assessment & Plan:  Abnormal weight gain-he has lost 9 pounds. Doing well medication without side effect. Will refill today. Followup in one month. Dustin Gasseratherine Metheney, MD

## 2014-03-25 ENCOUNTER — Ambulatory Visit (INDEPENDENT_AMBULATORY_CARE_PROVIDER_SITE_OTHER): Payer: Commercial Managed Care - PPO | Admitting: Family Medicine

## 2014-03-25 ENCOUNTER — Encounter: Payer: Self-pay | Admitting: Family Medicine

## 2014-03-25 VITALS — BP 130/88 | HR 79 | Wt 248.0 lb

## 2014-03-25 DIAGNOSIS — R635 Abnormal weight gain: Secondary | ICD-10-CM

## 2014-03-25 DIAGNOSIS — Z23 Encounter for immunization: Secondary | ICD-10-CM

## 2014-03-25 MED ORDER — PHENTERMINE-TOPIRAMATE 3.75-23 MG PO CP24
1.0000 | ORAL_CAPSULE | Freq: Every day | ORAL | Status: DC
Start: 2014-03-25 — End: 2014-04-06

## 2014-03-25 MED ORDER — PHENTERMINE-TOPIRAMATE ER 7.5-46 MG PO CP24: 1.0000 | ORAL_CAPSULE | Freq: Every day | ORAL | Status: DC

## 2014-03-25 NOTE — Progress Notes (Signed)
   Subjective:    Patient ID: Dustin Kennedy, male    DOB: 06/07/1966, 48 y.o.   MRN: [id number]  HPI Here to discuss abnormal weight gain-when I last saw him 4 weeks ago he actually lost 9 pounds on the phentermine was doing fantastic. He's been traveling a lot again for his job. He says his been working out a lot but just hasn't been able to eat the best he's been eating a lot of protein drinks and shakes. He hasn't been able to eat real meals. He is up a few pounds. His blood pressures borderline today. He just feels pressure he did. He feels it is really trying.   Review of Systems     Objective:   Physical Exam  Constitutional: He is oriented to person, place, and time. He appears well-developed and well-nourished.  HENT:  Head: Normocephalic and atraumatic.  Cardiovascular: Normal rate, regular rhythm and normal heart sounds.   Pulmonary/Chest: Effort normal and breath sounds normal.  Neurological: He is alert and oriented to person, place, and time.  Skin: Skin is warm and dry.  Psychiatric: He has a normal mood and affect. His behavior is normal.          Assessment & Plan:  Abnormal weight gain-discussed different options with him. I would like to consider trying a slightly different agent like to see me in that may work in a different way to help suppress appetite et Karie Soda. He does do well on the phentermine when he is home and not traveling as frequently as able to really work on his exercise and diet. Followup in 6 weeks. Call if any side effects or problems.

## 2014-04-06 ENCOUNTER — Encounter: Payer: Self-pay | Admitting: Family Medicine

## 2014-04-06 ENCOUNTER — Other Ambulatory Visit: Payer: Self-pay | Admitting: Family Medicine

## 2014-04-06 DIAGNOSIS — E291 Testicular hypofunction: Secondary | ICD-10-CM

## 2014-04-06 MED ORDER — PHENTERMINE HCL 37.5 MG PO CAPS: 37.5000 mg | ORAL_CAPSULE | ORAL | Status: DC

## 2015-09-10 ENCOUNTER — Encounter: Payer: Self-pay | Admitting: Family Medicine

## 2015-09-11 ENCOUNTER — Other Ambulatory Visit: Payer: Self-pay | Admitting: Family Medicine

## 2015-09-11 DIAGNOSIS — M79606 Pain in leg, unspecified: Secondary | ICD-10-CM

## 2015-09-14 LAB — CBC WITH DIFFERENTIAL/PLATELET
BASOS ABS: 0 10*3/uL (ref 0.0–0.1)
BASOS PCT: 0 % (ref 0–1)
EOS ABS: 0.2 10*3/uL (ref 0.0–0.7)
Eosinophils Relative: 2 % (ref 0–5)
HCT: 44.7 % (ref 39.0–52.0)
Hemoglobin: 15.4 g/dL (ref 13.0–17.0)
LYMPHS ABS: 2.9 10*3/uL (ref 0.7–4.0)
Lymphocytes Relative: 36 % (ref 12–46)
MCH: 29.8 pg (ref 26.0–34.0)
MCHC: 34.5 g/dL (ref 30.0–36.0)
MCV: 86.5 fL (ref 78.0–100.0)
MPV: 9.6 fL (ref 8.6–12.4)
Monocytes Absolute: 0.5 10*3/uL (ref 0.1–1.0)
Monocytes Relative: 6 % (ref 3–12)
NEUTROS PCT: 56 % (ref 43–77)
Neutro Abs: 4.5 10*3/uL (ref 1.7–7.7)
Platelets: 288 10*3/uL (ref 150–400)
RBC: 5.17 MIL/uL (ref 4.22–5.81)
RDW: 13.4 % (ref 11.5–15.5)
WBC: 8 10*3/uL (ref 4.0–10.5)

## 2015-09-14 NOTE — Progress Notes (Signed)
Quick Note:  All labs are normal. ______ 

## 2016-09-08 ENCOUNTER — Encounter: Payer: Self-pay | Admitting: Family Medicine

## 2016-09-27 ENCOUNTER — Other Ambulatory Visit: Payer: Self-pay | Admitting: Family Medicine

## 2016-09-27 ENCOUNTER — Ambulatory Visit: Payer: BLUE CROSS/BLUE SHIELD

## 2016-09-27 ENCOUNTER — Ambulatory Visit (INDEPENDENT_AMBULATORY_CARE_PROVIDER_SITE_OTHER): Payer: BLUE CROSS/BLUE SHIELD | Admitting: Family Medicine

## 2016-09-27 ENCOUNTER — Encounter: Payer: Self-pay | Admitting: Family Medicine

## 2016-09-27 ENCOUNTER — Ambulatory Visit
Admission: RE | Admit: 2016-09-27 | Discharge: 2016-09-27 | Disposition: A | Discharge status: Home / Self Care | Payer: BLUE CROSS/BLUE SHIELD | Source: Ambulatory Visit | Attending: Family Medicine | Admitting: Family Medicine

## 2016-09-27 VITALS — BP 129/68 | HR 79 | Ht 70.0 in | Wt 242.0 lb

## 2016-09-27 DIAGNOSIS — R51 Headache: Secondary | ICD-10-CM

## 2016-09-27 DIAGNOSIS — R519 Headache, unspecified: Secondary | ICD-10-CM

## 2016-09-27 DIAGNOSIS — R0683 Snoring: Secondary | ICD-10-CM

## 2016-09-27 MED ORDER — PREDNISONE 10 MG PO TABS: ORAL_TABLET | ORAL | 0 refills | Status: DC

## 2016-09-27 NOTE — Progress Notes (Signed)
   Subjective:    Patient ID: Dustin Kennedy, male    DOB: 05/29/1966, 51 y.o.   MRN: [id number]  HPI C/O HA x 2 weeks.  HA is frontal pressure. Taking tylenol. Does not feel like his sinuses are the problem He has no nasal congestion allergies or sneezing. He says that initially it started with him just waking up in the mornings with a headache. It would last anywhere from about 4-6 hours and typically ease off. But the last 2 days it has been persistent and has not eased off at all. At the lowest level of pain and discomfort it is a 6 out of 10 and then at times it will spike up to a 9-10 out of 10. He says he has never had had headaches like this before. And says he prior to these 2 weeks rarely ever gets a headache. He admits to stress levels at work have been high but this is not unusual. 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 He occasionally gets some neck discomfort but again this is not new. He does occasionally get an abnormal smell of smoke. If this will happen at least once a day and when it happens it and this causes him to choke and gag and feels like he can't talk for a few minutes. It happens almost every day and that's been going on for about 15 months. No recent head injury or trauma.  Review of Systems     Objective:   Physical Exam  Constitutional: He is oriented to person, place, and time. He appears well-developed and well-nourished.  HENT:  Head: Normocephalic and atraumatic.  Right Ear: External ear normal.  Left Ear: External ear normal.  Nose: Nose normal.  Mouth/Throat: Oropharynx is clear and moist.  TMs and canals are clear.   Eyes: Conjunctivae and EOM are normal. Pupils are equal, round, and reactive to light.  Neck: Neck supple. No thyromegaly present.  Cardiovascular: Normal rate and normal heart sounds.   Pulmonary/Chest: Effort normal and breath sounds normal.  Lymphadenopathy:    He has no cervical adenopathy.  Neurological: He is alert and oriented to  person, place, and time.  Skin: Skin is warm and dry.  Psychiatric: He has a normal mood and affect.       Assessment & Plan:  HA - Unclear etiology. We'll start with head CT and typically can get that scheduled stat. Did have him complete his stopping questionnaire. Answered yes to for the questions which does make him positive/high risk for sleep apnea. Will put him on a prednisone taper for the next 5 days to see if we can get this under control.  Snoring - STOP BANG - score of 4 which is high risk for possible sleep apnea. Will refer for further testing.

## 2016-09-30 ENCOUNTER — Other Ambulatory Visit: Payer: Self-pay | Admitting: Family Medicine

## 2016-09-30 ENCOUNTER — Encounter: Payer: Self-pay | Admitting: Family Medicine

## 2016-09-30 DIAGNOSIS — Z13220 Encounter for screening for lipoid disorders: Secondary | ICD-10-CM

## 2016-09-30 DIAGNOSIS — R030 Elevated blood-pressure reading, without diagnosis of hypertension: Secondary | ICD-10-CM

## 2016-09-30 MED ORDER — SUMATRIPTAN SUCCINATE 100 MG PO TABS: 100.0000 mg | ORAL_TABLET | ORAL | 0 refills | Status: DC | PRN

## 2016-10-01 DIAGNOSIS — R030 Elevated blood-pressure reading, without diagnosis of hypertension: Secondary | ICD-10-CM | POA: Diagnosis not present

## 2016-10-01 DIAGNOSIS — Z13220 Encounter for screening for lipoid disorders: Secondary | ICD-10-CM | POA: Diagnosis not present

## 2016-10-01 LAB — COMPLETE METABOLIC PANEL WITH GFR
ALT: 19 U/L (ref 9–46)
AST: 16 U/L (ref 10–35)
Albumin: 3.9 g/dL (ref 3.6–5.1)
Alkaline Phosphatase: 65 U/L (ref 40–115)
BILIRUBIN TOTAL: 0.8 mg/dL (ref 0.2–1.2)
BUN: 22 mg/dL (ref 7–25)
CHLORIDE: 103 mmol/L (ref 98–110)
CO2: 28 mmol/L (ref 20–31)
Calcium: 9.3 mg/dL (ref 8.6–10.3)
Creat: 1.01 mg/dL (ref 0.70–1.33)
GFR, EST NON AFRICAN AMERICAN: 86 mL/min (ref >=60)
GFR, Est African American: >89 mL/min (ref >=60)
GLUCOSE: 99 mg/dL (ref 65–99)
Potassium: 4.2 mmol/L (ref 3.5–5.3)
SODIUM: 141 mmol/L (ref 135–146)
TOTAL PROTEIN: 6.2 g/dL (ref 6.1–8.1)

## 2016-10-01 LAB — LIPID PANEL
Cholesterol: 148 mg/dL (ref <200)
HDL: 51 mg/dL (ref >40)
LDL Cholesterol: 75 mg/dL (ref <100)
Total CHOL/HDL Ratio: 2.9 Ratio (ref <5.0)
Triglycerides: 109 mg/dL (ref <150)
VLDL: 22 mg/dL (ref <30)

## 2016-10-01 LAB — TSH: TSH: 1.44 mIU/L (ref 0.40–4.50)

## 2016-10-01 NOTE — Progress Notes (Signed)
All labs are normal. 

## 2016-10-03 ENCOUNTER — Encounter: Payer: Self-pay | Admitting: Family Medicine

## 2016-10-03 ENCOUNTER — Other Ambulatory Visit: Payer: Self-pay | Admitting: Family Medicine

## 2016-10-03 DIAGNOSIS — G4452 New daily persistent headache (NDPH): Secondary | ICD-10-CM

## 2016-10-03 MED ORDER — AMITRIPTYLINE HCL 25 MG PO TABS: 25.0000 mg | ORAL_TABLET | Freq: Every day | ORAL | 1 refills | Status: DC

## 2016-10-03 NOTE — Progress Notes (Signed)
ai

## 2016-10-04 ENCOUNTER — Encounter: Payer: Self-pay | Admitting: Family Medicine

## 2016-10-04 ENCOUNTER — Ambulatory Visit (INDEPENDENT_AMBULATORY_CARE_PROVIDER_SITE_OTHER): Payer: BLUE CROSS/BLUE SHIELD | Admitting: Family Medicine

## 2016-10-04 VITALS — BP 137/76 | HR 83 | Ht 70.0 in | Wt 246.0 lb

## 2016-10-04 DIAGNOSIS — Z1211 Encounter for screening for malignant neoplasm of colon: Secondary | ICD-10-CM

## 2016-10-04 DIAGNOSIS — E291 Testicular hypofunction: Secondary | ICD-10-CM

## 2016-10-04 DIAGNOSIS — R51 Headache: Secondary | ICD-10-CM | POA: Diagnosis not present

## 2016-10-04 DIAGNOSIS — Z0000 Encounter for general adult medical examination without abnormal findings: Secondary | ICD-10-CM | POA: Diagnosis not present

## 2016-10-04 DIAGNOSIS — R519 Headache, unspecified: Secondary | ICD-10-CM

## 2016-10-04 DIAGNOSIS — R0683 Snoring: Secondary | ICD-10-CM | POA: Diagnosis not present

## 2016-10-04 NOTE — Progress Notes (Signed)
Subjective:    CC: CPE  HPI:  51 year old male who is here today for complete physical exam. He still continue to suffer from these persistent chronic headaches that started several weeks ago. We even did a head CT which was essentially negative. He did go ahead and start amitriptyline last night as well as taking an over-the-counter Allegra. He said the headache was not nearly as intense today and he actually did wake up with it which is the first time in days. We are going ahead and referring him to neurology because I'm unable to determine the cause of his headaches. We are referring him for a sleep study as well. His insurance will only pay for home studies with replaced a new order for this.  BP 137/76   Pulse 83   Ht 5\' 10"  (1.778 m)   Wt 246 lb (111.6 kg)   SpO2 99%   BMI 35.30 kg/m     No Known Allergies  Past Medical History:  Diagnosis Date  . Hyperlipidemia   . Hypertension     Past Surgical History:  Procedure Laterality Date  . lipomas removal     removal X 4  . LT shoulder surgery  1997   for bone spurs    Social History   Social History  . Marital status: Married    Spouse name: N/A  . Number of children: N/A  . Years of education: N/A   Occupational History  . Not on file.   Social History Main Topics  . Smoking status: Never Smoker  . Smokeless tobacco: Never Used  . Alcohol use Yes  . Drug use: No  . 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   Other Topics Concern  . Not on file   Social History Narrative  . No narrative on file    Family History  Problem Relation Age of Onset  . Hypertension      family history  . Hyperlipidemia      family history  . Thyroid disease      family history  . Stroke      family history    Outpatient Encounter Prescriptions as of 10/04/2016  Medication Sig  .  amitriptyline (ELAVIL) 25 MG tablet Take 1 tablet (25 mg total) by mouth at bedtime.  . Multiple Vitamins-Minerals (MULTIVITAMIN ADULT PO) Take by mouth.  . Omega-3 Fatty Acids (FISH OIL) 1000 MG CAPS Take by mouth.  . phentermine 37.5 MG capsule Take 37.5 mg by mouth every morning.  . SUMAtriptan (IMITREX) 100 MG tablet Take 1 tablet (100 mg total) by mouth every 2 (two) hours as needed for migraine. May repeat in 2 hours if headache persists or recurs.  . [DISCONTINUED] predniSONE (DELTASONE) 10 MG tablet 8 tab po Day 1, 6 tabs po Day 2, 4 tabs po Day 3, 2 tabs po Day 4, 1 tab po Day 5   No facility-administered encounter medications on file as of 10/04/2016.         Review of Systems: No fevers, chills, night sweats, weight loss, chest pain, or shortness of breath.   Objective:    Physical Exam  Constitutional: He is oriented to person, place, and time. He appears well-developed and well-nourished.  HENT:  Head: Normocephalic and atraumatic.  Right Ear: External ear normal.  Left Ear: External ear normal.  Nose:  Nose normal.  Mouth/Throat: Oropharynx is clear and moist.  Eyes: Conjunctivae and EOM are normal. Pupils are equal, round, and reactive to light.  Neck: Normal range of motion. Neck supple. No thyromegaly present.  Cardiovascular: Normal rate, regular rhythm, normal heart sounds and intact distal pulses.   Pulmonary/Chest: Effort normal and breath sounds normal.  Abdominal: Soft. Bowel sounds are normal. He exhibits no distension and no mass. There is no tenderness. There is no rebound and no guarding.  Musculoskeletal: Normal range of motion.  Lymphadenopathy:    He has no cervical adenopathy.  Neurological: He is alert and oriented to person, place, and time. He has normal reflexes. He displays normal reflexes. No cranial nerve deficit. He exhibits normal muscle tone.  Skin: Skin is warm and dry. No rash noted.  Psychiatric: He has a normal mood and affect. His behavior  is normal. Judgment and thought content normal.      Impression and Recommendations:    CPE Keep up a regular exercise program and make sure you are eating a healthy diet Try to eat 4 servings of dairy a day, or if you are lactose intolerant take a calcium with vitamin D daily.  Your vaccines are up to date.   Frontal headache-neurology referral was placed yesterday. Continue with amitriptyline and Allegra.  Snoring-new order placed for home sleep study.

## 2016-10-10 ENCOUNTER — Encounter: Payer: Self-pay | Admitting: Family Medicine

## 2016-10-10 DIAGNOSIS — R112 Nausea with vomiting, unspecified: Secondary | ICD-10-CM

## 2016-10-10 DIAGNOSIS — R519 Headache, unspecified: Secondary | ICD-10-CM

## 2016-10-10 DIAGNOSIS — R51 Headache: Principal | ICD-10-CM

## 2016-10-11 DIAGNOSIS — E291 Testicular hypofunction: Secondary | ICD-10-CM | POA: Diagnosis not present

## 2016-10-12 LAB — PSA: PSA: 1.1 ng/mL (ref <=4.0)

## 2016-10-14 ENCOUNTER — Encounter: Payer: Self-pay | Admitting: Neurology

## 2016-10-14 ENCOUNTER — Ambulatory Visit (INDEPENDENT_AMBULATORY_CARE_PROVIDER_SITE_OTHER): Payer: BLUE CROSS/BLUE SHIELD | Admitting: Neurology

## 2016-10-14 VITALS — BP 147/86 | HR 92 | Ht 70.0 in | Wt 247.0 lb

## 2016-10-14 DIAGNOSIS — G4489 Other headache syndrome: Secondary | ICD-10-CM | POA: Diagnosis not present

## 2016-10-14 MED ORDER — RIZATRIPTAN BENZOATE 10 MG PO TABS: 10.0000 mg | ORAL_TABLET | Freq: Three times a day (TID) | ORAL | 3 refills | Status: DC | PRN

## 2016-10-14 MED ORDER — TOPIRAMATE 25 MG PO TABS: ORAL_TABLET | ORAL | 3 refills | Status: DC

## 2016-10-14 NOTE — Patient Instructions (Signed)
   We will start Topamax for the headache, working up on the dose, and use Maxalt or ibuprofen if needed for the headache.  Topamax (topiramate) is a seizure medication that has an FDA approval for seizures and for migraine headache. Potential side effects of this medication include weight loss, cognitive slowing, tingling in the fingers and toes, and carbonated drinks will taste bad. If any significant side effects are noted on this drug, please contact our office.

## 2016-10-14 NOTE — Progress Notes (Signed)
Reason for visit: Headache  Referring physician: Dr. Everlena CooperMetheney  Dustin Kennedy is a 51 y.o. male  History of present illness:  [name] is a 51 year old right-handed white male with a history of headaches that began about 5 weeks ago. Initially, the headaches were not daily, but within the last 3 weeks they have converted to coming daily in nature. The patient reports that the headaches are associated with a pressure sensation in the front the head, and are present from sun up to sundown. The patient denies any significant neck stiffness with the headache. He may have photophobia and phonophobia, he may get nauseated but he does not vomit with the headache. May have some blurring of vision, but no loss of vision. He reports some mild cognitive slowing when the headache is severe. He is not missing work because of the headache. He drinks 2-3 cups of coffee daily, he takes in no other caffeinated products. He was seen by his primary care physician and was placed on amitriptyline taking 25 mg at night. He has been on this for about 2 weeks without much benefit, but he is concerned about the potential for weight gain, and he stopped the medication yesterday. The patient has undergone a CT scan of the brain that was unremarkable. This study was reviewed online. The patient denies any allergy symptoms or sinus drainage. He does report a 1-1/2 year history of transient episodes of smelling smoke that may make him cough or choke. This may occur once or twice a week. The patient denies any history of confusion or seizures with these olfactory events. He denies any family history of migraine headache with exception that he has a nephew who has headache. He did have some headaches 2 years ago associated with an elevation in blood pressure. He is sent to this office for an evaluation. The use of Imitrex was not beneficial for his headache.  Past Medical History:  Diagnosis Date  . Hyperlipidemia   . Hypertension      Past Surgical History:  Procedure Laterality Date  . lipomas removal     removal X 4  . LT shoulder surgery  1997   for bone spurs    Family History  Problem Relation Age of Onset  . Hypertension      family history  . Hyperlipidemia      family history  . Thyroid disease      family history  . Stroke      family history    Social history:  reports that he has never smoked. He has never used smokeless tobacco. He reports that he drinks alcohol. He reports that he does not use drugs.  Medications:  Prior to Admission medications   Medication Sig Start Date End Date Taking? Authorizing Provider  Multiple Vitamins-Minerals (MULTIVITAMIN ADULT PO) Take by mouth.   Yes Historical Provider, MD  Omega-3 Fatty Acids (FISH OIL) 1000 MG CAPS Take by mouth.   Yes Historical Provider, MD  phentermine 37.5 MG capsule Take 37.5 mg by mouth every morning.   Yes Historical Provider, MD  rizatriptan (MAXALT) 10 MG tablet Take 1 tablet (10 mg total) by mouth 3 (three) times daily as needed for migraine. 10/14/16   York Spanielharles K Willis, MD  topiramate (TOPAMAX) 25 MG tablet Take one tablet at night for one week, then take 2 tablets at night for one week, then take 3 tablets at night. 10/14/16   York Spanielharles K Willis, MD     No  Known Allergies  ROS:  Out of a complete 14 system review of symptoms, the patient complains only of the following symptoms, and all other reviewed systems are negative.  Weight gain Swelling in the legs Snoring Easy bruising Memory loss, dizziness Not enough sleep  Blood pressure (!) 147/86, pulse 92, height 5\' 10"  (1.778 m), weight 247 lb (112 kg).  Physical Exam  General: The patient is alert and cooperative at the time of the examination.  Eyes: Pupils are equal, round, and reactive to light. Discs are flat bilaterally.  Neck: The neck is supple, no carotid bruits are noted.  Respiratory: The respiratory examination is clear.  Cardiovascular: The  cardiovascular examination reveals a regular rate and rhythm, no obvious murmurs or rubs are noted.  Neuromuscular: Range of movement of the cervical spine is full. Some crepitus was noted in the temporomandibular joints bilaterally.  Skin: Extremities are without significant edema.  Neurologic Exam  Mental status: The patient is alert and oriented x 3 at the time of the examination. The patient has apparent normal recent and remote memory, with an apparently normal attention span and concentration ability.  Cranial nerves: Facial symmetry is present. There is good sensation of the face to pinprick and soft touch bilaterally. The strength of the facial muscles and the muscles to head turning and shoulder shrug are normal bilaterally. Speech is well enunciated, no aphasia or dysarthria is noted. Extraocular movements are full. Visual fields are full. The tongue is midline, and the patient has symmetric elevation of the soft palate. No obvious hearing deficits are noted.  Motor: The motor testing reveals 5 over 5 strength of all 4 extremities. Good symmetric motor tone is noted throughout.  Sensory: Sensory testing is intact to pinprick, soft touch, vibration sensation, and position sense on all 4 extremities. No evidence of extinction is noted.  Coordination: Cerebellar testing reveals good finger-nose-finger and heel-to-shin bilaterally.  Gait and station: Gait is normal. Tandem gait is normal. Romberg is negative. No drift is seen.  Reflexes: Deep tendon reflexes are symmetric and normal bilaterally. Toes are downgoing bilaterally.   Assessment/Plan:  1. Daily headaches  The patient has a normal clinical examination. He has been started on amitriptyline, but he is concerned about the possibility weight gain, he has stopped the medication. We will start Topamax currently for what appears to be headaches with migrainous features. The patient will be given a trial on Maxalt as Imitrex  offered no benefit. He is to take ibuprofen primarily for the headache if needed. The patient will be considered for MRI evaluation of the brain in the future if the headaches do not abate. He has unusual olfactory hallucinations at times. He will follow-up in 3 months.  Marlan Palau MD 10/14/2016 10:49 AM  [facility] [address] [place], [place] [address]  Phone [phone] Fax [phone]

## 2016-10-15 LAB — TESTOSTERONE, FREE & TOTAL

## 2016-10-25 ENCOUNTER — Ambulatory Visit
Admission: RE | Admit: 2016-10-25 | Discharge: 2016-10-25 | Disposition: A | Discharge status: Home / Self Care | Payer: BLUE CROSS/BLUE SHIELD | Source: Ambulatory Visit | Attending: Family Medicine | Admitting: Family Medicine

## 2016-10-25 DIAGNOSIS — R51 Headache: Secondary | ICD-10-CM | POA: Diagnosis not present

## 2016-10-25 MED ORDER — GADOBENATE DIMEGLUMINE 529 MG/ML IV SOLN
20.0000 mL | Freq: Once | INTRAVENOUS | Status: AC | PRN
  Administered 2016-10-25: 20 mL via INTRAVENOUS

## 2016-10-28 ENCOUNTER — Encounter: Payer: Self-pay | Admitting: Family Medicine

## 2016-10-28 ENCOUNTER — Encounter: Payer: Self-pay | Admitting: Neurology

## 2016-10-28 DIAGNOSIS — G4489 Other headache syndrome: Secondary | ICD-10-CM

## 2016-10-28 DIAGNOSIS — M542 Cervicalgia: Secondary | ICD-10-CM

## 2016-10-28 DIAGNOSIS — I83893 Varicose veins of bilateral lower extremities with other complications: Secondary | ICD-10-CM

## 2016-10-31 ENCOUNTER — Other Ambulatory Visit: Payer: Self-pay | Admitting: Family Medicine

## 2016-10-31 MED ORDER — TIZANIDINE HCL 4 MG PO CAPS: 4.0000 mg | ORAL_CAPSULE | Freq: Three times a day (TID) | ORAL | 0 refills | Status: DC | PRN

## 2016-11-13 ENCOUNTER — Ambulatory Visit (HOSPITAL_BASED_OUTPATIENT_CLINIC_OR_DEPARTMENT_OTHER): Payer: BLUE CROSS/BLUE SHIELD

## 2016-11-13 ENCOUNTER — Ambulatory Visit (HOSPITAL_BASED_OUTPATIENT_CLINIC_OR_DEPARTMENT_OTHER): Payer: BLUE CROSS/BLUE SHIELD | Attending: Family Medicine | Admitting: Internal Medicine

## 2016-11-13 DIAGNOSIS — R0683 Snoring: Secondary | ICD-10-CM | POA: Diagnosis not present

## 2016-11-13 DIAGNOSIS — G4733 Obstructive sleep apnea (adult) (pediatric): Secondary | ICD-10-CM | POA: Diagnosis not present

## 2016-11-13 DIAGNOSIS — R51 Headache: Secondary | ICD-10-CM | POA: Diagnosis not present

## 2016-11-18 NOTE — Addendum Note (Signed)
Addended by: Nani Gasser D on: 11/18/2016 08:53 PM   Modules accepted: Orders

## 2016-11-21 ENCOUNTER — Other Ambulatory Visit (HOSPITAL_BASED_OUTPATIENT_CLINIC_OR_DEPARTMENT_OTHER): Payer: Self-pay

## 2016-11-21 DIAGNOSIS — R0683 Snoring: Secondary | ICD-10-CM

## 2016-11-21 DIAGNOSIS — R519 Headache, unspecified: Secondary | ICD-10-CM

## 2016-11-21 DIAGNOSIS — R51 Headache: Principal | ICD-10-CM

## 2016-11-23 DIAGNOSIS — G4733 Obstructive sleep apnea (adult) (pediatric): Secondary | ICD-10-CM | POA: Diagnosis not present

## 2016-11-23 NOTE — Procedures (Signed)
   Patient Name: Dustin Kennedy, Dustin Kennedy Date: 11/14/2016 Gender: Male D.O.B: 1965/09/14 Age (years): 50 Referring Provider: Nani Gasser Height (inches): 70 Interpreting Physician: Jetty Duhamel MD, ABSM Weight (lbs): 240 RPSGT: [place] BMI: 34 MRN: [id number] Neck Size: 18.00 CLINICAL INFORMATION Sleep Study Type: unattended HST      Indication for sleep study: Snoring     Epworth Sleepiness Score: 8  SLEEP STUDY TECHNIQUE A multi-channel overnight portable sleep study was performed. The channels recorded were: nasal airflow, thoracic respiratory movement, and oxygen saturation with a pulse oximetry. Snoring was also monitored.  MEDICATIONS Patient self administered medications include: none reported.  SLEEP ARCHITECTURE Patient was studied for 244.5 minutes. The sleep efficiency was 99.8 % and the patient was supine for 69.9%. The arousal index was 0.0 per hour.  RESPIRATORY PARAMETERS The overall AHI was 8.6 per hour, with a central apnea index of 0.2 per hour.  The oxygen nadir was 85% during sleep.  CARDIAC DATA Mean heart rate during sleep was 53.0 bpm.  IMPRESSIONS - Mild obstructive sleep apnea occurred during this study (AHI = 8.6/h). - No significant central sleep apnea occurred during this study (CAI = 0.2/h). - Moderate oxygen desaturation was noted during this study (Min O2 = 85%, Mean saturation 95%). - Patient snored.  DIAGNOSIS - Obstructive Sleep Apnea (327.23 [G47.33 ICD-10])  RECOMMENDATIONS - Treatment options for mild OSA based on clinical judgment. Weight loss and sleep off flat of back may help. On an individual basis, CPAP, fitted oral appliance or surgical evaluation may be appropriate.. - Avoid alcohol, sedatives and other CNS depressants that may worsen sleep apnea and disrupt normal sleep architecture. - Sleep hygiene should be reviewed to assess factors that may improve sleep quality. - Weight management and regular  exercise should be initiated or continued.  [Electronically signed] 11/23/2016 10:40 AM  Jetty Duhamel MD, ABSM Diplomate, American Board of Sleep Medicine   NPI: [id number]  Waymon Budge Diplomate, American Board of Sleep Medicine  ELECTRONICALLY SIGNED ON:  11/23/2016, 10:36 AM [facility] PH: [phone]   FX: [phone] ACCREDITED BY THE AMERICAN ACADEMY OF SLEEP MEDICINE

## 2016-11-25 DIAGNOSIS — Z1211 Encounter for screening for malignant neoplasm of colon: Secondary | ICD-10-CM | POA: Diagnosis not present

## 2016-11-25 LAB — HM COLONOSCOPY

## 2016-11-27 ENCOUNTER — Encounter: Payer: Self-pay | Admitting: Family Medicine

## 2016-11-27 ENCOUNTER — Ambulatory Visit (INDEPENDENT_AMBULATORY_CARE_PROVIDER_SITE_OTHER): Payer: BLUE CROSS/BLUE SHIELD | Admitting: Physical Therapy

## 2016-11-27 ENCOUNTER — Telehealth: Payer: Self-pay | Admitting: Neurology

## 2016-11-27 ENCOUNTER — Encounter: Payer: Self-pay | Admitting: Physical Therapy

## 2016-11-27 DIAGNOSIS — M542 Cervicalgia: Secondary | ICD-10-CM

## 2016-11-27 DIAGNOSIS — M62830 Muscle spasm of back: Secondary | ICD-10-CM | POA: Diagnosis not present

## 2016-11-27 NOTE — Telephone Encounter (Signed)
Patient called back and spoke with phone staff. Could not take Friday appt. Scheduled him for tomorrow at 12pm, check in 1145am. Pt verbalized understanding.

## 2016-11-27 NOTE — Telephone Encounter (Signed)
The patient is having increased headaches, okay to have him worked in for a revisit.

## 2016-11-27 NOTE — Therapy (Signed)
[facility] [place] [address] [place], [place], [address] Phone: [phone]   Fax:  [phone]  Physical Therapy Evaluation  Patient Details  Name: Dustin Kennedy MRN: [id number] Date of Birth: 10-07-1965 Referring Provider: Dr Linford Arnold  Encounter Date: 11/27/2016      PT End of Session - 11/27/16 0713    Visit Number 1   Number of Visits 8   Date for PT Re-Evaluation 12/25/16   PT Start Time 0713   PT Stop Time 0800   PT Time Calculation (min) 47 min   Activity Tolerance Patient tolerated treatment well  had 2-3 point reduction in HA pain      Past Medical History:  Diagnosis Date  . Hyperlipidemia   . Hypertension     Past Surgical History:  Procedure Laterality Date  . lipomas removal     removal X 4  . LT shoulder surgery  1997   for bone spurs    There were no vitals filed for this visit.       Subjective Assessment - 11/27/16 0713    Subjective Pt reports he has headaches for about 11-12 weeks now.has tried different medication without help.  Also had multiple tests and they have all been negative.  Wakes up every night with headache.    Diagnostic tests CT scan/MRI all negative, going to have a sleep study.    Patient Stated Goals get rid of headaches.  So he can't miss work.    Currently in Pain? Yes   Pain Score 7    Pain Location Head   Pain Orientation Right   Pain Descriptors / Indicators Throbbing  N&V,    Pain Type Acute pain   Pain Radiating Towards starts in front of head, Rt side more    Pain Onset More than a month ago   Pain Frequency Intermittent   Aggravating Factors  light and noise   Pain Relieving Factors nothing   Effect of Pain on Daily Activities his sleeping is interrupted, has had to leave work             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 work in yard     Observation/Other Assessments   Focus on Therapeutic Outcomes (FOTO)  32% limited     Posture/Postural Control   Posture/Postural Control Postural limitations   Postural Limitations Rounded Shoulders;Forward head     ROM / Strength   AROM / PROM / Strength AROM;Strength     AROM   AROM Assessment Site Shoulder;Elbow;Cervical   Right/Left Shoulder --  bilat WNL   Right/Left Elbow --  bilat WNL   Cervical Flexion WNL   Cervical Extension WNL   Cervical - Right Side Bend WNL   Cervical - Left Side Bend WNL   Cervical - Right Rotation WNL   Cervical - Left Rotation WNL     Strength  Overall Strength Comments mid traps 4+/5. low traps 4-/5   Strength Assessment Site Shoulder;Elbow   Right/Left Shoulder --  bilat WNL except Rt ER 4+/5   Right/Left Elbow --  bilat WNL     Flexibility   Soft Tissue Assessment /Muscle Length --  very tight bilat pecs     Palpation   Spinal mobility hypomobile in cervical and thoracic spine with CPA mobs   Palpation comment very tight in bilat pecs, upper traps/levators, cervical paraspinals and periocciput muscles.                    [facility] Adult PT Treatment/Exercise - 11/27/16 0001      Exercises   Exercises Neck     Neck Exercises: Seated   Other Seated Exercise cervical and scapular retraction.      Neck Exercises: Supine   Other Supine Exercise 10 reps head and shoulder pressess, vc for form     Manual Therapy   Manual Therapy Soft tissue mobilization;Myofascial release   Manual therapy comments upper trap and pec stretching.     Soft tissue mobilization STM to peri-occipital muscles, cervical paraspinals,    Myofascial Release occipital base release     Neck Exercises: Stretches   Other Neck Stretches 3 way door way stretches.                 PT Education - 11/27/16 0951    Education provided Yes   Education Details HEP, posture and DN   Person(s) Educated Patient   Methods Explanation;Demonstration;Handout   Comprehension Returned demonstration;Verbalized understanding             PT Long Term Goals - 11/27/16 0709      PT LONG TERM GOAL #1   Title I with HEP for postural realignment ( 12/25/16)    Time 4   Period Weeks   Status New     PT LONG TERM GOAL #2   Title Report =/< 75% reduction of headaches to allow him to sleep per his previous level ( 12/25/16)    Time 4   Period Weeks   Status New     PT LONG TERM GOAL #3   Title improve FOTO =/< 25% limited ( 12/25/16)    Time 4   Period Weeks   Status New     PT LONG TERM GOAL #4   Title report no need to leave work due to headaches for =/> 1 wk. ( 12/25/16)    Time 4   Period Weeks   Status New               Plan - 11/27/16 8119    Clinical Impression Statement 51 yo male presents for moderate complexity PT eval.  He has had headaches for the last 3 months that are interferring with his sleep, work and daily activities.  He has had multiple testing that have all been negative and failed treatment with medication.  He presents with postural changes that place stress on the cervical musculature, he is tight in multiple upper body muscles, hypomobile in his spine and slight weakness in his upper back.    Rehab Potential Excellent   PT Frequency 2x / week   PT Duration 4 weeks   PT Treatment/Interventions Moist Heat;Traction;Ultrasound;Therapeutic exercise;Therapeutic activities;Dry needling;Taping;Manual techniques;Neuromuscular re-education;Cryotherapy;Electrical Stimulation;Patient/family education   PT Next Visit Plan  manual work to include DN to upper cervical muscles, pecs.  Postural alignement ex.    Consulted and Agree  with Plan of Care Patient      Patient will benefit from skilled therapeutic intervention in order to improve the following deficits and impairments:  Postural dysfunction, Decreased strength, Hypomobility, Pain, Increased muscle spasms  Visit Diagnosis: Cervicalgia - Plan: PT plan of care cert/re-cert  Muscle spasm of back - Plan: PT plan of care cert/re-cert     Problem List Patient Active Problem List   Diagnosis Date Noted  . Headache syndrome 10/14/2016  . Osteoarthritis of left acromioclavicular joint 08/12/2012  . Obesity (BMI 30-39.9) 07/31/2012  . Rotator cuff syndrome of left shoulder 07/31/2012  . Insomnia 02/01/2011  . Hypogonadism in male 02/05/2010  . WEIGHT GAIN 10/20/2009  . ELEVATED BLOOD PRESSURE WITHOUT DIAGNOSIS OF HYPERTENSION 10/20/2009    Roderic Scarce PT  11/27/2016, 10:02 AM  [facility] [address] [place], [place], [address] Phone: [phone]   Fax:  [phone]  Name: Dustin Kennedy MRN: [id number] Date of Birth: 1966/07/19

## 2016-11-27 NOTE — Telephone Encounter (Signed)
I had a call from [place] at [facility] office wanting to have the patient appt moved up sooner his HA'a are increasing and having multiple HA's he has an upcoming appt please call Adelina Mings [phone] thanks dg

## 2016-11-27 NOTE — Patient Instructions (Addendum)
Decompression Exercise: Head Support    Lie on back on firm surface, knees bent, feet flat, arms turned up, out to sides, backs of hands down. Support under head: pillow. Press head down with slight chin tuck.  Hold _5__ secs, 10 reps. Twice a day. Surface: floor   Shoulder Press    Press both shoulders down. Hold _5__ seconds. Repeat _10__ times. Twice a day. Surface: floor   Scapular Retraction (Standing)    With arms at sides, pinch shoulder blades together. Repeat __5-10__ times per set. Do __1__ sets per session. Do _multiple___ sessions per day.   Flexibility: Neck Retraction    Pull head straight back, keeping eyes and jaw level. Repeat _5-10___ times per set. Do __1__ sets per session. Do multiple____ sessions per day.  Scapula Adduction With Pectorals, Low   Stand in doorframe with palms against frame and arms at 45. Step forward and squeeze shoulder blades. Hold __30-45_ seconds. Repeat __1_ times per session. Do _1-2__ sessions per day.  Scapula Adduction With Pectorals, Mid-Range   Stand in doorframe with palms against frame and arms at 90. Step forward and squeeze shoulder blades. Hold __30-45_ seconds. Repeat _1__ times per session. Do _1-2__ sessions per day.  \Scapula Adduction With Pectorals, High   Stand in doorframe with palms against frame and arms at 120. Step forward and squeeze shoulder blades. Hold _30-45__ seconds. Repeat __1_ times per session. Do __1-2_ sessions per day.  Copyright  VHI. All rights reserved.  Trigger Point Dry Needling  . What is Trigger Point Dry Needling (DN)? o DN is a physical therapy technique used to treat muscle pain and dysfunction. Specifically, DN helps deactivate muscle trigger points (muscle knots).  o A thin filiform needle is used to penetrate the skin and stimulate the underlying trigger point. The goal is for a local twitch response (LTR) to occur and for the trigger point to relax. No medication of any  kind is injected during the procedure.   . What Does Trigger Point Dry Needling Feel Like?  o The procedure feels different for each individual patient. Some patients report that they do not actually feel the needle enter the skin and overall the process is not painful. Very mild bleeding may occur. However, many patients feel a deep cramping in the muscle in which the needle was inserted. This is the local twitch response.   Marland Kitchen How Will I feel after the treatment? o Soreness is normal, and the onset of soreness may not occur for a few hours. Typically this soreness does not last longer than two days.  o Bruising is uncommon, however; ice can be used to decrease any possible bruising.  o In rare cases feeling tired or nauseous after the treatment is normal. In addition, your symptoms may get worse before they get better, this period will typically not last longer than 24 hours.   . What Can I do After My Treatment? o Increase your hydration by drinking more water for the next 24 hours. o You may place ice or heat on the areas treated that have become sore, however, do not use heat on inflamed or bruised areas. Heat often brings more relief post needling. o You can continue your regular activities, but vigorous activity is not recommended initially after the treatment for 24 hours. o DN is best combined with other physical therapy such as strengthening, stretching, and other therapies.

## 2016-11-27 NOTE — Telephone Encounter (Signed)
Called and LVM for pt offering appt this Friday at 12pm, check in 1130am. Asked him to call back and let me know if this appt works. Gave [facility] phone number.

## 2016-11-27 NOTE — Telephone Encounter (Signed)
I called the patient, left message. We will try to contact him and get him in for an earlier revisit given the increase in frequency of headaches.  We will consider MRI of the brain.

## 2016-11-28 ENCOUNTER — Encounter: Payer: Self-pay | Admitting: Neurology

## 2016-11-28 ENCOUNTER — Ambulatory Visit (INDEPENDENT_AMBULATORY_CARE_PROVIDER_SITE_OTHER): Payer: BLUE CROSS/BLUE SHIELD | Admitting: Neurology

## 2016-11-28 VITALS — BP 144/86 | HR 78 | Ht 70.0 in | Wt 246.5 lb

## 2016-11-28 DIAGNOSIS — G479 Sleep disorder, unspecified: Secondary | ICD-10-CM

## 2016-11-28 DIAGNOSIS — G4489 Other headache syndrome: Secondary | ICD-10-CM

## 2016-11-28 MED ORDER — TOPIRAMATE 50 MG PO TABS: ORAL_TABLET | ORAL | 3 refills | Status: DC

## 2016-11-28 MED ORDER — PREDNISONE 5 MG PO TABS: ORAL_TABLET | ORAL | 0 refills | Status: DC

## 2016-11-28 NOTE — Progress Notes (Signed)
Reason for visit: Headache  Dustin Kennedy is an 51 y.o. male  History of present illness:  Dustin Kennedy is a 51 year old right-handed white male with a history of frequent headaches. The patient was recently seen with a history of daily headaches, he was placed on Topamax and he has worked up to 75 mg at night. He is tolerating the medication. The patient is having 2-5 headaches a week, the headaches may oftentimes come on in the middle of the night and persist throughout the day. The patient may have some slight nausea, but no vomiting. He has recently undergone a sleep study that shows abnormalities that could be consistent with sleep apnea. The patient has undergone MRI of the brain that was unremarkable. He is having problems functioning at work at times. He has taken Maxalt without much benefit. He has been given tizanidine to take if needed for the headache, this may help on occasion. He does have some neck discomfort, but his headaches oftentimes is bifrontal and temporal in nature. The patient will be set up through his primary care physician for physical therapy on the neck. He returns for an evaluation on an urgent basis.  Past Medical History:  Diagnosis Date  . Hyperlipidemia   . Hypertension     Past Surgical History:  Procedure Laterality Date  . lipomas removal     removal X 4  . LT shoulder surgery  1997   for bone spurs    Family History  Problem Relation Age of Onset  . Hypertension      family history  . Hyperlipidemia      family history  . Thyroid disease      family history  . Stroke      family history    Social history:  reports that he has never smoked. He has never used smokeless tobacco. He reports that he drinks alcohol. He reports that he does not use drugs.   No Known Allergies  Medications:  Prior to Admission medications   Medication Sig Start Date End Date Taking? Authorizing Provider  Multiple Vitamins-Minerals (MULTIVITAMIN ADULT PO) Take  by mouth.   Yes Historical Provider, MD  Omega-3 Fatty Acids (FISH OIL) 1000 MG CAPS Take by mouth.   Yes Historical Provider, MD  phentermine 37.5 MG capsule Take 37.5 mg by mouth every morning.   Yes Historical Provider, MD  rizatriptan (MAXALT) 10 MG tablet Take 1 tablet (10 mg total) by mouth 3 (three) times daily as needed for migraine. 10/14/16  Yes York Spanielharles K Belisa Eichholz, MD  tiZANidine (ZANAFLEX) 4 MG capsule Take 1 capsule (4 mg total) by mouth 3 (three) times daily as needed for muscle spasms. 10/31/16  Yes Agapito Gamesatherine D Metheney, MD  topiramate (TOPAMAX) 25 MG tablet Take one tablet at night for one week, then take 2 tablets at night for one week, then take 3 tablets at night. 10/14/16  Yes York Spanielharles K Antwann Preziosi, MD    ROS:  Out of a complete 14 system review of symptoms, the patient complains only of the following symptoms, and all other reviewed systems are negative.  Light sensitivity Nausea Sleep apnea, snoring Urinary urgency Neck pain, neck stiffness Dizziness, headache  Blood pressure (!) 144/86, pulse 78, height 5\' 10"  (1.778 m), weight 246 lb 8 oz (111.8 kg).  Physical Exam  General: The patient is alert and cooperative at the time of the examination.  Skin: No significant peripheral edema is noted.   Neurologic Exam  Mental status:  The patient is alert and oriented x 3 at the time of the examination. The patient has apparent normal recent and remote memory, with an apparently normal attention span and concentration ability.   Cranial nerves: Facial symmetry is present. Speech is normal, no aphasia or dysarthria is noted. Extraocular movements are full. Visual fields are full.  Motor: The patient has good strength in all 4 extremities.  Sensory examination: Soft touch sensation is symmetric on the face, arms, and legs.  Coordination: The patient has good finger-nose-finger and heel-to-shin bilaterally.  Gait and station: The patient has a normal gait. Tandem gait is  normal. Romberg is negative. No drift is seen.  Reflexes: Deep tendon reflexes are symmetric.   MRI brain 10/25/16:  IMPRESSION: Normal brain MRI. No findings to explain patient's symptoms identified.  * MRI scan images were reviewed online. I agree with the written report.    Assessment/Plan:  1. Intractable headache  2. Possible sleep apnea  The patient will be increased on the Topamax, this seems to help the headache frequency some, the headaches are no longer daily in nature. The patient will go up to 100 mg at night for a week and then go to 150 mg at night. The patient will be set up for sleep evaluation. He will be placed on a 5 mg 6 day pack of prednisone. He will follow-up for his next scheduled appointment in June.  Marlan Palau MD 11/28/2016 12:13 PM  [facility] [address] [place], [place] [address]  Phone [phone] Fax [phone]

## 2016-11-28 NOTE — Patient Instructions (Signed)
   We will go up on the Topamax to 100 mg at night for one week, then take 150 mg at night.  We will try prednisone dose pack.

## 2016-12-02 ENCOUNTER — Ambulatory Visit (INDEPENDENT_AMBULATORY_CARE_PROVIDER_SITE_OTHER): Payer: BLUE CROSS/BLUE SHIELD | Admitting: Rehabilitative and Restorative Service Providers"

## 2016-12-02 ENCOUNTER — Encounter: Payer: Self-pay | Admitting: Rehabilitative and Restorative Service Providers"

## 2016-12-02 DIAGNOSIS — M62830 Muscle spasm of back: Secondary | ICD-10-CM

## 2016-12-02 DIAGNOSIS — M542 Cervicalgia: Secondary | ICD-10-CM

## 2016-12-02 NOTE — Patient Instructions (Signed)
Axial Extension (Chin Tuck)    Pull chin in and lengthen back of neck. Hold __10__ seconds while counting out loud. Repeat _5-_10__ times. Do __several__ sessions per day.  Shoulder Blade Squeeze    Rotate shoulders back, then squeeze shoulder blades down and back. Hold 10 sec repeat _10___ times. Do __several__ sessions per day.   Trigger Point Dry Needling  . What is Trigger Point Dry Needling (DN)? o DN is a physical therapy technique used to treat muscle pain and dysfunction. Specifically, DN helps deactivate muscle trigger points (muscle knots).  o A thin filiform needle is used to penetrate the skin and stimulate the underlying trigger point. The goal is for a local twitch response (LTR) to occur and for the trigger point to relax. No medication of any kind is injected during the procedure.   . What Does Trigger Point Dry Needling Feel Like?  o The procedure feels different for each individual patient. Some patients report that they do not actually feel the needle enter the skin and overall the process is not painful. Very mild bleeding may occur. However, many patients feel a deep cramping in the muscle in which the needle was inserted. This is the local twitch response.   Marland Kitchen. How Will I feel after the treatment? o Soreness is normal, and the onset of soreness may not occur for a few hours. Typically this soreness does not last longer than two days.  o Bruising is uncommon, however; ice can be used to decrease any possible bruising.  o In rare cases feeling tired or nauseous after the treatment is normal. In addition, your symptoms may get worse before they get better, this period will typically not last longer than 24 hours.   . What Can I do After My Treatment? o Increase your hydration by drinking more water for the next 24 hours. o You may place ice or heat on the areas treated that have become sore, however, do not use heat on inflamed or bruised areas. Heat often brings more  relief post needling. o You can continue your regular activities, but vigorous activity is not recommended initially after the treatment for 24 hours. o DN is best combined with other physical therapy such as strengthening, stretching, and other therapies.

## 2016-12-02 NOTE — Therapy (Signed)
[facility] [place] [address] [place], [place], [address] Phone: [phone]   Fax:  [phone]  Physical Therapy Treatment  Patient Details  Name: Dustin HunKerry Kennedy MRN: [id number] Date of Birth: 07/08/1966 Referring Provider: Dr Linford ArnoldMetheney  Encounter Date: 12/02/2016      PT End of Session - 12/02/16 0800    Visit Number 2   Number of Visits 8   Date for PT Re-Evaluation 12/25/16   PT Start Time 0800   PT Stop Time 0858   PT Time Calculation (min) 58 min   Activity Tolerance Patient tolerated treatment well      Past Medical History:  Diagnosis Date  . Hyperlipidemia   . Hypertension     Past Surgical History:  Procedure Laterality Date  . lipomas removal     removal X 4  . LT shoulder surgery  1997   for bone spurs    There were no vitals filed for this visit.      Subjective Assessment - 12/02/16 0801    Subjective Headaches are some better - about medium now. Doing exercises at home. Thursday was his worse day. He thinks the exercises have helped. [facility] [facility].    Currently in Pain? Yes   Pain Score 2    Pain Location Head   Pain Orientation Right   Pain Descriptors / Indicators Throbbing   Pain Type Acute pain                         [facility] Adult PT Treatment/Exercise - 12/02/16 0001      Self-Care   Self-Care --  instructed in use of swim noodle for sitting/exercise     Neck Exercises: Standing   Neck Retraction 10 reps;10 secs  w/ noodle   Other Standing Exercises scap squeeze 10 sec x 10 with noodle      Neck Exercises: Supine   Neck Retraction 10 reps;10 secs  on pillow      Moist Heat Therapy   Number Minutes Moist Heat 20 Minutes   Moist Heat Location Cervical  thoracic spine      Electrical Stimulation   Electrical Stimulation Location bilat cervical    Electrical Stimulation Action IFC   Electrical Stimulation Parameters to tolerance   Electrical Stimulation Goals Tone;Pain     Manual Therapy   Manual Therapy Soft tissue mobilization;Myofascial release   Manual therapy comments pt prone and supine    Soft tissue mobilization occipital and cervical paraspinals; SCM; upper trap; leveator    Myofascial Release occipital release   Passive ROM passive stretch into cervical flexion; flexionwith slight rotation to each side - 30-45 sec hold 2-3 reps each      Neck Exercises: Stretches   Other Neck Stretches 3 way door way stretches.           Trigger Point Dry Needling - 12/02/16 0850    Consent Given? Yes   Education Handout Provided Yes   Muscles Treated Upper Body Suboccipitals muscle group;Longissimus  bilat    SubOccipitals Response Twitch response elicited;Palpable increased muscle length   Longissimus Response Palpable increased muscle length  cervical - bilat               PT Education - 12/02/16 0813    Education Details HEP, posture and alignment; DN   Person(s) Educated Patient   Methods Explanation;Demonstration;Tactile cues;Verbal cues;Handout   Comprehension Verbalized understanding;Returned demonstration;Verbal cues required  PT Long Term Goals - 12/02/16 0800      PT LONG TERM GOAL #1   Title I with HEP for postural realignment ( 12/25/16)    Time 4   Period Weeks   Status On-going     PT LONG TERM GOAL #2   Title Report =/< 75% reduction of headaches to allow him to sleep per his previous level ( 12/25/16)    Time 4   Period Weeks   Status On-going     PT LONG TERM GOAL #3   Title improve FOTO =/< 25% limited ( 12/25/16)    Time 4   Period Weeks   Status On-going     PT LONG TERM GOAL #4   Title report no need to leave work due to headaches for =/> 1 wk. ( 12/25/16)    Time 4   Period Weeks   Status On-going               Plan - 12/02/16 1610    Clinical Impression Statement Good response to initial treatment. Working on posture and exercise at home.  Excellent response to postural correction; exercise; manual work and DN followed by modalities. No HA following treatment. No goals accomplished - second visit.    Rehab Potential Excellent   PT Frequency 2x / week   PT Duration 4 weeks   PT Treatment/Interventions Moist Heat;Traction;Ultrasound;Therapeutic exercise;Therapeutic activities;Dry needling;Taping;Manual techniques;Neuromuscular re-education;Cryotherapy;Electrical Stimulation;Patient/family education   PT Next Visit Plan manual work to include DN to upper cervical muscles, pecs.  Postural alignement ex. assess response to DN    Consulted and Agree with Plan of Care Patient      Patient will benefit from skilled therapeutic intervention in order to improve the following deficits and impairments:  Postural dysfunction, Decreased strength, Hypomobility, Pain, Increased muscle spasms  Visit Diagnosis: Cervicalgia  Muscle spasm of back     Problem List Patient Active Problem List   Diagnosis Date Noted  . Headache syndrome 10/14/2016  . Osteoarthritis of left acromioclavicular joint 08/12/2012  . Obesity (BMI 30-39.9) 07/31/2012  . Rotator cuff syndrome of left shoulder 07/31/2012  . Insomnia 02/01/2011  . Hypogonadism in male 02/05/2010  . WEIGHT GAIN 10/20/2009  . ELEVATED BLOOD PRESSURE WITHOUT DIAGNOSIS OF HYPERTENSION 10/20/2009    Claudean Leavelle Rober Minion PT, MPH  12/02/2016, 8:55 AM  [facility] [address] [place], [place], [address] Phone: [phone]   Fax:  [phone]  Name: Dustin Kennedy MRN: [id number] Date of Birth: 1966-06-25

## 2016-12-04 ENCOUNTER — Encounter (HOSPITAL_BASED_OUTPATIENT_CLINIC_OR_DEPARTMENT_OTHER): Payer: BLUE CROSS/BLUE SHIELD

## 2016-12-04 ENCOUNTER — Ambulatory Visit (INDEPENDENT_AMBULATORY_CARE_PROVIDER_SITE_OTHER): Payer: BLUE CROSS/BLUE SHIELD | Admitting: Rehabilitative and Restorative Service Providers"

## 2016-12-04 ENCOUNTER — Encounter: Payer: Self-pay | Admitting: Rehabilitative and Restorative Service Providers"

## 2016-12-04 DIAGNOSIS — M542 Cervicalgia: Secondary | ICD-10-CM

## 2016-12-04 DIAGNOSIS — M62830 Muscle spasm of back: Secondary | ICD-10-CM | POA: Diagnosis not present

## 2016-12-04 NOTE — Therapy (Addendum)
[facility] [place] [address] [place], [place], [address] Phone: [phone]   Fax:  [phone]  Physical Therapy Treatment  Patient Details  Name: Dustin Kennedy MRN: [id number] Date of Birth: 1966/03/17 Referring Provider: Dr Linford Arnold  Encounter Date: 12/04/2016      PT End of Session - 12/04/16 0713    Visit Number 3   Number of Visits 8   Date for PT Re-Evaluation 12/25/16   PT Start Time 0714   PT Stop Time 0811   PT Time Calculation (min) 57 min   Activity Tolerance Patient tolerated treatment well      Past Medical History:  Diagnosis Date  . Hyperlipidemia   . Hypertension     Past Surgical History:  Procedure Laterality Date  . lipomas removal     removal X 4  . LT shoulder surgery  1997   for bone spurs    There were no vitals filed for this visit.      Subjective Assessment - 12/04/16 0718    Subjective No HA since treatment Monday until this am about 4:30 when he awoke with "things on my mind". thinks that the Dn was helpful. Making progress.    Pain Score 5    Pain Location Head   Pain Orientation Right   Pain Descriptors / Indicators Throbbing   Pain Type Acute pain   Pain Onset More than a month ago                         [facility] Adult PT Treatment/Exercise - 12/04/16 0001      Neuro Re-ed    Neuro Re-ed Details  working on posture and alignment with axial extension      Neck Exercises: Standing   Neck Retraction 10 reps;15 secs  w/ noodle   Other Standing Exercises scap squeeze 10 sec x 10 with noodle      Neck Exercises: Supine   Neck Retraction 10 reps;15 secs  on pillow    Other Supine Exercise prolonged snow angel stretch ~2 min on noodle arms at ~ 80 deg      Moist Heat Therapy   Number Minutes Moist Heat 20 Minutes   Moist Heat Location Cervical  thoracic spine      Electrical Stimulation   Electrical Stimulation Location bilat cervical    Electrical  Stimulation Action IFC   Electrical Stimulation Parameters to tolerance   Electrical Stimulation Goals Tone;Pain     Manual Therapy   Manual Therapy Soft tissue mobilization;Myofascial release   Manual therapy comments pt prone and supine    Soft tissue mobilization occipital and cervical paraspinals; SCM; upper trap; leveator    Myofascial Release occipital release   Passive ROM passive stretch into cervical flexion; flexionwith slight rotation to each side - 30-45 sec hold 2-3 reps each      Neck Exercises: Stretches   Other Neck Stretches 3 way door way stretches.       DN preformed to bialt occipitals and cervical paraspinals with good tolerance and release of muscular tightness following treatment.           PT Education - 12/04/16 0757    Education provided Yes   Education Details HEP    Person(s) Educated Patient   Methods Explanation;Demonstration;Tactile cues;Verbal cues;Handout   Comprehension Verbalized understanding;Returned demonstration;Verbal cues required;Tactile cues required             PT Long Term Goals -  12/02/16 0800      PT LONG TERM GOAL #1   Title I with HEP for postural realignment ( 12/25/16)    Time 4   Period Weeks   Status On-going     PT LONG TERM GOAL #2   Title Report =/< 75% reduction of headaches to allow him to sleep per his previous level ( 12/25/16)    Time 4   Period Weeks   Status On-going     PT LONG TERM GOAL #3   Title improve FOTO =/< 25% limited ( 12/25/16)    Time 4   Period Weeks   Status On-going     PT LONG TERM GOAL #4   Title report no need to leave work due to headaches for =/> 1 wk. ( 12/25/16)    Time 4   Period Weeks   Status On-going               Plan - 12/04/16 0716    Clinical Impression Statement Good response to DN with no HA until this morning Thinks he awoke with "things on his mind". Note decreased yet continued tightness in the cervical and anterior chest musculature. Progressing  well toward stated goals of therapy.    Rehab Potential Excellent   PT Frequency 2x / week   PT Duration 4 weeks   PT Treatment/Interventions Moist Heat;Traction;Ultrasound;Therapeutic exercise;Therapeutic activities;Dry needling;Taping;Manual techniques;Neuromuscular re-education;Cryotherapy;Electrical Stimulation;Patient/family education   PT Next Visit Plan manual work to include DN to upper cervical muscles, pecs.  Postural alignement ex   Consulted and Agree with Plan of Care Patient      Patient will benefit from skilled therapeutic intervention in order to improve the following deficits and impairments:  Postural dysfunction, Decreased strength, Hypomobility, Pain, Increased muscle spasms  Visit Diagnosis: Cervicalgia  Muscle spasm of back     Problem List Patient Active Problem List   Diagnosis Date Noted  . Headache syndrome 10/14/2016  . Osteoarthritis of left acromioclavicular joint 08/12/2012  . Obesity (BMI 30-39.9) 07/31/2012  . Rotator cuff syndrome of left shoulder 07/31/2012  . Insomnia 02/01/2011  . Hypogonadism in male 02/05/2010  . WEIGHT GAIN 10/20/2009  . ELEVATED BLOOD PRESSURE WITHOUT DIAGNOSIS OF HYPERTENSION 10/20/2009    Destinee Taber Rober MinionP Malania Gawthrop PT, MPH  12/04/2016, 7:58 AM  [facility] [address] [place], [place], [address] Phone: [phone]   Fax:  [phone]  Name: Dustin Kennedy MRN: [id number] Date of Birth: 12/06/1965

## 2016-12-04 NOTE — Patient Instructions (Addendum)
SUPINE Tips A    Being in the supine position means to be lying on the back. Lying on the back is the position of least compression on the bones and discs of the spine, and helps to re-align the natural curves of the back. Swim noodle along spine.  Moving arms up toward 120 deg working toward 5 min stretch   Resisted External Rotation: in Neutral - Bilateral   PALMS UP Sit or stand, tubing in both hands, elbows at sides, bent to 90, forearms forward. Pinch shoulder blades together and rotate forearms out. Keep elbows at sides. Repeat __10__ times per set. Do _2-3___ sets per session. Do several__ sessions per day.

## 2016-12-05 DIAGNOSIS — I83891 Varicose veins of right lower extremities with other complications: Secondary | ICD-10-CM | POA: Diagnosis not present

## 2016-12-05 DIAGNOSIS — R600 Localized edema: Secondary | ICD-10-CM | POA: Diagnosis not present

## 2016-12-09 ENCOUNTER — Ambulatory Visit (INDEPENDENT_AMBULATORY_CARE_PROVIDER_SITE_OTHER): Payer: BLUE CROSS/BLUE SHIELD | Admitting: Rehabilitative and Restorative Service Providers"

## 2016-12-09 DIAGNOSIS — M542 Cervicalgia: Secondary | ICD-10-CM

## 2016-12-09 DIAGNOSIS — M62830 Muscle spasm of back: Secondary | ICD-10-CM

## 2016-12-09 NOTE — Patient Instructions (Signed)
Resisted External Rotation: in Neutral - Bilateral   PALMS UP Sit or stand, tubing in both hands, elbows at sides, bent to 90, forearms forward. Pinch shoulder blades together and rotate forearms out. Keep elbows at sides. Repeat __10__ times per set. Do _2-3___ sets per session. Do _2-3___ sessions per day.   Low Row: Standing   Face anchor, feet shoulder width apart. Palms up, pull arms back, squeezing shoulder blades together. Repeat 10__ times per set. Do 2-3__ sets per session. Do 2-3__ sessions per day. Anchor Height: Waist   Strengthening: Resisted Extension   Hold tubing in right hand, arm forward. Pull arm back, elbow straight. Repeat _10___ times per set. Do 2-3____ sets per session. Do 2-3____ sessions per day.   Lying on back shoulders and head supported on pillows - neck unsupported between Can tuck chin to stretch

## 2016-12-09 NOTE — Therapy (Signed)
[facility] [address] [place], [place], [address] Phone: [phone]   Fax:  [phone]  Physical Therapy Treatment  Patient Details  Name: Dustin Kennedy MRN: [id number] Date of Birth: 08/18/1965 Referring Provider: Dr Linford ArnoldMetheney  Encounter Date: 12/09/2016      PT End of Session - 12/09/16 0711    Visit Number 4   Number of Visits 8   Date for PT Re-Evaluation 12/25/16   PT Start Time 0711   PT Stop Time 0808   PT Time Calculation (min) 57 min   Activity Tolerance Patient tolerated treatment well      Past Medical History:  Diagnosis Date  . Hyperlipidemia   . Hypertension     Past Surgical History:  Procedure Laterality Date  . lipomas removal     removal X 4  . LT shoulder surgery  1997   for bone spurs    There were no vitals filed for this visit.      Subjective Assessment - 12/09/16 0712    Subjective Patient reports progress overall. Worked for about 8 hours in the yard Saturday and had some HA yesterday. He did the stretches yesterday and HA subsided. Notices that his jaw is sagging - both sides. Has a knot on the muscle of his neck. Continues to make progress.    Currently in Pain? --  neck pain - no HA    Pain Score 6    Pain Location Neck   Pain Orientation Right   Pain Descriptors / Indicators Tightness  tension             [facility] PT Assessment - 12/09/16 0001      Assessment   Medical Diagnosis Headaches and neck pain   Referring Provider Dr Linford ArnoldMetheney   Onset Date/Surgical Date 08/30/16   Hand Dominance Right   Next MD Visit PRN   Prior Therapy none     Palpation   Spinal mobility hypomobile in cervical and thoracic spine with CPA mobs   Palpation comment improving tightness in bilat pecs, upper traps/levators, cervical paraspinals and periocciput muscles.                      [facility] Adult PT Treatment/Exercise - 12/09/16 0001      Neck Exercises: Standing   Neck Retraction 10 reps;15 secs  w/ noodle   Other Standing Exercises scap squeeze 10 sec x 10 with noodle      Neck Exercises: Supine   Neck Retraction 10 reps;15 secs  on pillow    Other Supine Exercise shoulders and head supported on pillows; neck unsupported gentle chin tuck  - ~2 min with intermittent chin tuck      Shoulder Exercises: Standing   Extension Strengthening;Both;20 reps;Theraband   Theraband Level (Shoulder Extension) Level 4 (Blue)   Row Strengthening;Both;20 reps;Theraband   Theraband Level (Shoulder Row) Level 4 (Blue)   Retraction Strengthening;Both;20 reps;Theraband   Theraband Level (Shoulder Retraction) Level 2 (Red)     Moist Heat Therapy   Number Minutes Moist Heat 20 Minutes   Moist Heat Location Cervical  thoracic spine      Electrical Stimulation   Electrical Stimulation Location bilat cervical    Electrical Stimulation Action IFC and trial of estim with DN   Electrical Stimulation Parameters to tolerance   Electrical Stimulation Goals Tone;Pain     Manual Therapy   Manual Therapy Myofascial release   Manual therapy comments pt prone  Joint Mobilization thoracic and lower cervical mobs CPA and lateral glides    Soft tissue mobilization occipital and cervical paraspinals     Neck Exercises: Stretches   Other Neck Stretches 3 way door way stretches.           Trigger Point Dry Needling - 12/09/16 4098    Consent Given? Yes   Muscles Treated Upper Body Suboccipitals muscle group;Longissimus  bilat - estim Rt    SubOccipitals Response Palpable increased muscle length   Longissimus Response Palpable increased muscle length  cervical bilat               PT Education - 12/09/16 0733    Education provided Yes   Education Details HEP    Person(s) Educated Patient   Methods Explanation;Demonstration;Tactile cues;Verbal cues;Handout   Comprehension Verbalized understanding;Returned demonstration;Verbal cues required;Tactile cues  required             PT Long Term Goals - 12/02/16 0800      PT LONG TERM GOAL #1   Title I with HEP for postural realignment ( 12/25/16)    Time 4   Period Weeks   Status On-going     PT LONG TERM GOAL #2   Title Report =/< 75% reduction of headaches to allow him to sleep per his previous level ( 12/25/16)    Time 4   Period Weeks   Status On-going     PT LONG TERM GOAL #3   Title improve FOTO =/< 25% limited ( 12/25/16)    Time 4   Period Weeks   Status On-going     PT LONG TERM GOAL #4   Title report no need to leave work due to headaches for =/> 1 wk. ( 12/25/16)    Time 4   Period Weeks   Status On-going               Plan - 12/09/16 1191    Clinical Impression Statement Progressing with treatment. Notes less HA. He has some continued tightness and "tension" in the back of his neck - mostly on the Rt. Patient demonstrates improving posture; increasing ROM through the cervical area. He continues to have palpable tightness through the cervical area. Responds well to manual work and DN. Cotninues to progress toward stated goals of therapy.    Rehab Potential Excellent   PT Frequency 2x / week   PT Duration 4 weeks   PT Treatment/Interventions Moist Heat;Traction;Ultrasound;Therapeutic exercise;Therapeutic activities;Dry needling;Taping;Manual techniques;Neuromuscular re-education;Cryotherapy;Electrical Stimulation;Patient/family education   PT Next Visit Plan manual work to include DN to upper cervical muscles, pecs.  Postural alignement ex   Consulted and Agree with Plan of Care Patient      Patient will benefit from skilled therapeutic intervention in order to improve the following deficits and impairments:  Postural dysfunction, Decreased strength, Hypomobility, Pain, Increased muscle spasms  Visit Diagnosis: Cervicalgia  Muscle spasm of back     Problem List Patient Active Problem List   Diagnosis Date Noted  . Headache syndrome 10/14/2016  .  Osteoarthritis of left acromioclavicular joint 08/12/2012  . Obesity (BMI 30-39.9) 07/31/2012  . Rotator cuff syndrome of left shoulder 07/31/2012  . Insomnia 02/01/2011  . Hypogonadism in male 02/05/2010  . WEIGHT GAIN 10/20/2009  . ELEVATED BLOOD PRESSURE WITHOUT DIAGNOSIS OF HYPERTENSION 10/20/2009    Dustin Kennedy PT, MPH  12/09/2016, 8:14 AM  [facility] [address] [place], [place], [address] Phone: [phone]  Fax:  [phone]  Name: Dustin Kennedy MRN: [id number] Date of Birth: June 07, 1966

## 2016-12-11 ENCOUNTER — Ambulatory Visit (INDEPENDENT_AMBULATORY_CARE_PROVIDER_SITE_OTHER): Payer: BLUE CROSS/BLUE SHIELD | Admitting: Rehabilitative and Restorative Service Providers"

## 2016-12-11 ENCOUNTER — Encounter: Payer: Self-pay | Admitting: Rehabilitative and Restorative Service Providers"

## 2016-12-11 DIAGNOSIS — M62830 Muscle spasm of back: Secondary | ICD-10-CM | POA: Diagnosis not present

## 2016-12-11 DIAGNOSIS — M542 Cervicalgia: Secondary | ICD-10-CM | POA: Diagnosis not present

## 2016-12-11 NOTE — Therapy (Signed)
[facility] [place] [address] [place], [place], [address] Phone: [phone]   Fax:  [phone]  Physical Therapy Treatment  Patient Details  Name: Dustin Kennedy MRN: [id number] Date of Birth: Apr 26, 1966 Referring Provider: Dr Linford Arnold  Encounter Date: 12/11/2016      PT End of Session - 12/11/16 0719    Visit Number 5   Number of Visits 8   Date for PT Re-Evaluation 12/25/16   PT Start Time 0717   PT Stop Time 0757   PT Time Calculation (min) 40 min   Activity Tolerance Patient tolerated treatment well      Past Medical History:  Diagnosis Date  . Hyperlipidemia   . Hypertension     Past Surgical History:  Procedure Laterality Date  . lipomas removal     removal X 4  . LT shoulder surgery  1997   for bone spurs    There were no vitals filed for this visit.      Subjective Assessment - 12/11/16 0720    Subjective Good improvement. Less soreness and no HA. Used icy hot for the past couple of days which has helped a lot. Some remaining soreness in the lower cervical area.    Currently in Pain? Yes   Pain Score 1    Pain Location Neck   Pain Orientation Right   Pain Descriptors / Indicators Tightness                         [facility] Adult PT Treatment/Exercise - 12/11/16 0001      Neck Exercises: Supine   Neck Retraction 10 reps;15 secs  on pillow    Other Supine Exercise shoulders and head supported on pillows; neck unsupported gentle chin tuck  - ~2 min with intermittent chin tuck      Moist Heat Therapy   Number Minutes Moist Heat 12 Minutes   Moist Heat Location Cervical  thoracic spine      Electrical Stimulation   Electrical Stimulation Location bilat cervical    Electrical Stimulation Action IFC and estim with DN   Electrical Stimulation Parameters to tolerance   Electrical Stimulation Goals Tone;Pain     Manual Therapy   Manual Therapy Myofascial release   Manual therapy  comments pt prone   Joint Mobilization thoracic and lower cervical mobs CPA and lateral glides    Soft tissue mobilization occipital and cervical paraspinals     Neck Exercises: Stretches   Other Neck Stretches 3 way door way stretches.    Other Neck Stretches UE's in full flexion stepping through the doorway 30 sec x 2           Trigger Point Dry Needling - 12/11/16 0744    Consent Given? Yes   Muscles Treated Upper Body --  bilat - estim bilat cervical to upper thoracic    SubOccipitals Response Palpable increased muscle length  bilat    Longissimus Response Palpable increased muscle length  bilat                    PT Long Term Goals - 12/11/16 2841      PT LONG TERM GOAL #1   Title I with HEP for postural realignment ( 12/25/16)    Time 4   Period Weeks   Status On-going     PT LONG TERM GOAL #2   Title Report =/< 75% reduction of headaches to allow him to sleep  per his previous level ( 12/25/16)    Time 4   Period Weeks   Status On-going     PT LONG TERM GOAL #3   Title improve FOTO =/< 25% limited ( 12/25/16)    Time 4   Period Weeks   Status On-going     PT LONG TERM GOAL #4   Title report no need to leave work due to headaches for =/> 1 wk. ( 12/25/16)    Time 4   Period Weeks   Status On-going               Plan - 12/11/16 16100722    Clinical Impression Statement Excellent progress since beginning PT. He has no HA for the past couple of days just some continued soreness mostly on the Rt with tightness noted in that area as well as C7/T1. Patient had to leave for another appointment. Will add additional posterior shoulder irdle strengthening at next visit.    Rehab Potential Excellent   PT Frequency 2x / week   PT Duration 4 weeks   PT Treatment/Interventions Moist Heat;Traction;Ultrasound;Therapeutic exercise;Therapeutic activities;Dry needling;Taping;Manual techniques;Neuromuscular re-education;Cryotherapy;Electrical  Stimulation;Patient/family education   PT Next Visit Plan manual work to include DN to upper cervical muscles, pecs.  Postural alignment ex - trial of prone posterior shoulder girdle strengthening at next visit. Continue with cervical and thoracic mobs/manual work    Financial plannerConsulted and Agree with Plan of Care Patient      Patient will benefit from skilled therapeutic intervention in order to improve the following deficits and impairments:  Postural dysfunction, Decreased strength, Hypomobility, Pain, Increased muscle spasms  Visit Diagnosis: Cervicalgia  Muscle spasm of back     Problem List Patient Active Problem List   Diagnosis Date Noted  . Headache syndrome 10/14/2016  . Osteoarthritis of left acromioclavicular joint 08/12/2012  . Obesity (BMI 30-39.9) 07/31/2012  . Rotator cuff syndrome of left shoulder 07/31/2012  . Insomnia 02/01/2011  . Hypogonadism in male 02/05/2010  . WEIGHT GAIN 10/20/2009  . ELEVATED BLOOD PRESSURE WITHOUT DIAGNOSIS OF HYPERTENSION 10/20/2009    Ante Arredondo Rober MinionP Summar Mcglothlin PT, MPH  12/11/2016, 7:47 AM  [facility] [address] [place], [place], [address] Phone: [phone]   Fax:  [phone]  Name: Dustin Kennedy MRN: [id number] Date of Birth: 08/03/1965

## 2016-12-16 ENCOUNTER — Ambulatory Visit (INDEPENDENT_AMBULATORY_CARE_PROVIDER_SITE_OTHER): Payer: BLUE CROSS/BLUE SHIELD | Admitting: Rehabilitative and Restorative Service Providers"

## 2016-12-16 ENCOUNTER — Encounter: Payer: Self-pay | Admitting: Rehabilitative and Restorative Service Providers"

## 2016-12-16 DIAGNOSIS — M62830 Muscle spasm of back: Secondary | ICD-10-CM

## 2016-12-16 DIAGNOSIS — M542 Cervicalgia: Secondary | ICD-10-CM | POA: Diagnosis not present

## 2016-12-16 NOTE — Patient Instructions (Addendum)
Lying on stomach tuck chin; lift head keeping chin tucked(looking in mirror) Pause 1-2 sec  Repeat 10-20 times   Shoulder Blade Squeeze: W    Arms out to sides at 90 palms down. Bend elbows to 90. Press pelvis down. Squeeze backbone with shoulder blades. Raise arms, front of shoulders, chest, and head. Keep neck neutral. Hold _2__ seconds. Relax. Repeat __10-20_ times.   Shoulder Blade Squeeze: Airplane    Arms out to sides at 90, elbows straight, palms down. Press pelvis down. Squeeze backbone with shoulder blades. Raise arms, front of shoulders, chest, and head. Keep neck neutral. Hold __2_ seconds. Relax. Repeat __10-20_ times.    Shoulder Blade Squeeze: Superperson    Arms alongside head, elbows straight, palms down. Press pelvis down. Squeeze backbone with shoulder blades. Raise arms, chest, and head. Keep neck neutral. Hold ___ seconds. Relax. Repeat _10-20__ times.

## 2016-12-16 NOTE — Therapy (Signed)
[facility] [place] [address] [place] [address] [place], [place], [address] Phone: [phone]   Fax:  [phone]  Physical Therapy Treatment  Patient Details  Name: Dustin Kennedy MRN: [id number] Date of Birth: Oct 06, 1965 Referring Provider: Dr Linford Arnold  Encounter Date: 12/16/2016      PT End of Session - 12/16/16 0800    Visit Number 6   Number of Visits 8   Date for PT Re-Evaluation 12/25/16   PT Start Time 0712   PT Stop Time 0810   PT Time Calculation (min) 58 min   Activity Tolerance Patient tolerated treatment well      Past Medical History:  Diagnosis Date  . Hyperlipidemia   . Hypertension     Past Surgical History:  Procedure Laterality Date  . lipomas removal     removal X 4  . LT shoulder surgery  1997   for bone spurs    There were no vitals filed for this visit.      Subjective Assessment - 12/16/16 0714    Subjective At a conference this weekeknd - sitting in uncomfortable chairs for hours - had a "little bit of a HA". He stretched his neck and "kept working on his neck" and the HA resolved in about 3 hours. He has worked on stretching his neck last night and this am. Neck is much better overall. And even though he had a HA it was not as bad nor did it last as long as it normally would.    Currently in Pain? Yes   Pain Score 1    Pain Location Neck   Pain Orientation Right   Pain Descriptors / Indicators Tightness   Pain Type Acute pain   Pain Onset More than a month ago   Pain Frequency Intermittent            [facility] PT Assessment - 12/16/16 0001      Assessment   Medical Diagnosis Headaches and neck pain   Referring Provider Dr Linford Arnold   Onset Date/Surgical Date 08/30/16   Hand Dominance Right   Next MD Visit PRN   Prior Therapy none     Strength   Overall Strength Comments mid traps 5-/5'; lower traps Rt 5-/5 Lt 5/5    Right/Left Shoulder --  6/6 throughout      Palpation   Spinal mobility  hypomobile in cervical and thoracic spine with CPA mobs   Palpation comment improving tightness in bilat pecs, upper traps/levators, cervical paraspinals and periocciput muscles.                      [facility] Adult PT Treatment/Exercise - 12/16/16 0001      Neck Exercises: Machines for Strengthening   UBE (Upper Arm Bike) L4 x 3 min 1.5 fwd/1.5 back     Neck Exercises: Prone   Axial Exentsion 20 reps   Axial Extension Limitations VC/TC for position of chin/head in neutral    Other Prone Exercise W's; rows; shd horiz abd; supperman bilat x 10 each VC/TC for position of head and neck in neutral      Shoulder Exercises: Standing   Extension Strengthening;Both;20 reps;Theraband   Theraband Level (Shoulder Extension) Level 4 (Blue)   Row Strengthening;Both;20 reps;Theraband  added row at 45 deg blue TB x 20    Theraband Level (Shoulder Row) Level 4 (Blue)   Retraction Strengthening;Both;20 reps;Theraband   Theraband Level (Shoulder Retraction) Level 2 (Red)     Moist Heat  Therapy   Number Minutes Moist Heat 20 Minutes   Moist Heat Location Cervical  thoracic spine      Electrical Stimulation   Electrical Stimulation Location bilat cervical    Electrical Stimulation Action IFC and estim with DN   Electrical Stimulation Parameters to tolerance   Electrical Stimulation Goals Tone;Pain     Manual Therapy   Manual Therapy Myofascial release   Manual therapy comments pt prone and supine    Joint Mobilization thoracic and lower cervical mobs CPA and lateral glides    Soft tissue mobilization occipital and cervical paraspinals   Myofascial Release posterior cervical upper trap   Passive ROM cervical spine into flexion; flexion with rotation; lateral flexion      Neck Exercises: Stretches   Other Neck Stretches 3 way door way stretches.    Other Neck Stretches UE's in full flexion stepping through the doorway 30 sec x 2           Trigger Point Dry Needling - 12/16/16  0745    Consent Given? Yes   Muscles Treated Upper Body --  DN bilat estim Rt cervical    SubOccipitals Response Palpable increased muscle length  bilat   Longissimus Response Palpable increased muscle length  bilat               PT Education - 12/16/16 0730    Education provided Yes   Education Details HEP    Person(s) Educated Patient   Methods Explanation;Demonstration;Tactile cues;Handout;Verbal cues   Comprehension Verbalized understanding;Returned demonstration;Verbal cues required;Tactile cues required             PT Long Term Goals - 12/16/16 0719      PT LONG TERM GOAL #1   Title I with HEP for postural realignment ( 12/25/16)    Time 4   Period Weeks   Status On-going     PT LONG TERM GOAL #2   Title Report =/< 75% reduction of headaches to allow him to sleep per his previous level ( 12/25/16)    Time 4   Period Weeks   Status Achieved     PT LONG TERM GOAL #3   Title improve FOTO =/< 25% limited ( 12/25/16)    Time 4   Period Weeks   Status On-going     PT LONG TERM GOAL #4   Title report no need to leave work due to headaches for =/> 1 wk. ( 12/25/16)    Time 4   Period Weeks   Status Achieved               Plan - 12/16/16 0717    Clinical Impression Statement Copntinued progress with decreased frequency and intensity of HA. He has continued tightness in the right side of his neck but that is improved with stretching.Strength and posture are improving. Accoplished some goals. Progressing well toward remainder of stated goals of therapy.   Rehab Potential Excellent   PT Frequency 2x / week   PT Duration 4 weeks   PT Treatment/Interventions Moist Heat;Traction;Ultrasound;Therapeutic exercise;Therapeutic activities;Dry needling;Taping;Manual techniques;Neuromuscular re-education;Cryotherapy;Electrical Stimulation;Patient/family education   PT Next Visit Plan manual work to include DN to upper cervical muscles, pecs.  Postural alignment ex -  continue posterior shoulder girdle strengthening as well as cervical and thoracic mobs/manual work    Consulted and Agree with Plan of Care Patient      Patient will benefit from skilled therapeutic intervention in order to improve the following deficits and impairments:  Postural dysfunction, Decreased strength, Hypomobility, Pain, Increased muscle spasms  Visit Diagnosis: Cervicalgia  Muscle spasm of back     Problem List Patient Active Problem List   Diagnosis Date Noted  . Headache syndrome 10/14/2016  . Osteoarthritis of left acromioclavicular joint 08/12/2012  . Obesity (BMI 30-39.9) 07/31/2012  . Rotator cuff syndrome of left shoulder 07/31/2012  . Insomnia 02/01/2011  . Hypogonadism in male 02/05/2010  . WEIGHT GAIN 10/20/2009  . ELEVATED BLOOD PRESSURE WITHOUT DIAGNOSIS OF HYPERTENSION 10/20/2009    Dustin Kennedy PT, MPH  12/16/2016, 8:01 AM  [facility] [address] [place] [place] [address] [place], [place], [address] Phone: [phone]   Fax:  [phone]  Name: Dustin Kennedy MRN: [id number] Date of Birth: 1966-04-20

## 2016-12-18 ENCOUNTER — Ambulatory Visit (INDEPENDENT_AMBULATORY_CARE_PROVIDER_SITE_OTHER): Payer: BLUE CROSS/BLUE SHIELD | Admitting: Physical Therapy

## 2016-12-18 ENCOUNTER — Encounter: Payer: Self-pay | Admitting: Physical Therapy

## 2016-12-18 DIAGNOSIS — M542 Cervicalgia: Secondary | ICD-10-CM

## 2016-12-18 DIAGNOSIS — M62830 Muscle spasm of back: Secondary | ICD-10-CM | POA: Diagnosis not present

## 2016-12-18 NOTE — Therapy (Signed)
[facility] [address] [place], [place], [address] Phone: [phone]   Fax:  [phone]  Physical Therapy Treatment  Patient Details  Name: Dustin HunKerry Kennedy MRN: [id number] Date of Birth: 04/08/1966 Referring Provider: Dr Linford ArnoldMetheney  Encounter Date: 12/18/2016      PT End of Session - 12/18/16 0713    Visit Number 7   Number of Visits 8   Date for PT Re-Evaluation 12/25/16   PT Start Time 0713   PT Stop Time 0823   PT Time Calculation (min) 70 min   Activity Tolerance Patient tolerated treatment well      Past Medical History:  Diagnosis Date  . Hyperlipidemia   . Hypertension     Past Surgical History:  Procedure Laterality Date  . lipomas removal     removal X 4  . LT shoulder surgery  1997   for bone spurs    There were no vitals filed for this visit.      Subjective Assessment - 12/18/16 0717    Subjective Nash DimmerKerry is doing well no HA and very minor neck pain today.    Currently in Pain? Yes   Pain Score 1    Pain Location Neck   Pain Orientation Left;Right   Pain Descriptors / Indicators Dull   Pain Onset More than a month ago   Pain Frequency Intermittent   Aggravating Factors  over doing it   Pain Relieving Factors needling                         [facility] Adult PT Treatment/Exercise - 12/18/16 0001      Neck Exercises: Machines for Strengthening   UBE (Upper Arm Bike) L4 x 3 min 1.5 fwd/1.5 back     Neck Exercises: Prone   Other Prone Exercise prone plank hand walks up/down step, simulated lat pulls downs with black band, VC for form      Modalities   Modalities Electrical Stimulation;Moist Heat     Moist Heat Therapy   Number Minutes Moist Heat 15 Minutes   Moist Heat Location Cervical     Electrical Stimulation   Electrical Stimulation Location bilat cervical    Electrical Stimulation Action IFC   Electrical Stimulation Parameters to tolerance   Electrical Stimulation  Goals Tone;Pain     Manual Therapy   Soft tissue mobilization occipital and cervical paraspinals     Neck Exercises: Stretches   Other Neck Stretches chest and shoulder stretches using strap in door.           Trigger Point Dry Needling - 12/18/16 0747    Consent Given? Yes   Education Handout Provided No   Muscles Treated Upper Body Oblique capitus;Longissimus;Upper trapezius   Upper Trapezius Response Palpable increased muscle length;Twitch reponse elicited  proximal   Oblique Capitus Response Palpable increased muscle length;Twitch response elicited   SubOccipitals Response Palpable increased muscle length;Twitch response elicited   Longissimus Response Palpable increased muscle length;Twitch response elicited                   PT Long Term Goals - 12/16/16 0719      PT LONG TERM GOAL #1   Title I with HEP for postural realignment ( 12/25/16)    Time 4   Period Weeks   Status On-going     PT LONG TERM GOAL #2   Title Report =/< 75% reduction of headaches to allow him to sleep  per his previous level ( 12/25/16)    Time 4   Period Weeks   Status Achieved     PT LONG TERM GOAL #3   Title improve FOTO =/< 25% limited ( 12/25/16)    Time 4   Period Weeks   Status On-going     PT LONG TERM GOAL #4   Title report no need to leave work due to headaches for =/> 1 wk. ( 12/25/16)    Time 4   Period Weeks   Status Achieved               Plan - 12/18/16 0736    Clinical Impression Statement Dustin Kennedy is pleased with his progress. Feels like he continues to make improvements.  Discussed his POC and he thinks he would like to continue and possibly decrease frequency to once a week.    Rehab Potential Excellent   PT Frequency 2x / week   PT Duration 4 weeks   PT Treatment/Interventions Moist Heat;Traction;Ultrasound;Therapeutic exercise;Therapeutic activities;Dry needling;Taping;Manual techniques;Neuromuscular re-education;Cryotherapy;Electrical  Stimulation;Patient/family education   PT Next Visit Plan write renewal for once a week. Perform FOTO    Consulted and Agree with Plan of Care Patient      Patient will benefit from skilled therapeutic intervention in order to improve the following deficits and impairments:  Postural dysfunction, Decreased strength, Hypomobility, Pain, Increased muscle spasms  Visit Diagnosis: Cervicalgia  Muscle spasm of back     Problem List Patient Active Problem List   Diagnosis Date Noted  . Headache syndrome 10/14/2016  . Osteoarthritis of left acromioclavicular joint 08/12/2012  . Obesity (BMI 30-39.9) 07/31/2012  . Rotator cuff syndrome of left shoulder 07/31/2012  . Insomnia 02/01/2011  . Hypogonadism in male 02/05/2010  . WEIGHT GAIN 10/20/2009  . ELEVATED BLOOD PRESSURE WITHOUT DIAGNOSIS OF HYPERTENSION 10/20/2009    Dustin Kennedy PT  12/18/2016, 8:25 AM  [facility] [address] [place], [place], [address] Phone: [phone]   Fax:  [phone]  Name: Dustin Kennedy MRN: [id number] Date of Birth: Jan 09, 1966

## 2016-12-25 ENCOUNTER — Encounter: Payer: BLUE CROSS/BLUE SHIELD | Admitting: Physical Therapy

## 2016-12-27 ENCOUNTER — Ambulatory Visit (INDEPENDENT_AMBULATORY_CARE_PROVIDER_SITE_OTHER): Payer: BLUE CROSS/BLUE SHIELD | Admitting: Rehabilitative and Restorative Service Providers"

## 2016-12-27 ENCOUNTER — Encounter: Payer: Self-pay | Admitting: Family Medicine

## 2016-12-27 ENCOUNTER — Encounter: Payer: Self-pay | Admitting: Rehabilitative and Restorative Service Providers"

## 2016-12-27 DIAGNOSIS — M62830 Muscle spasm of back: Secondary | ICD-10-CM | POA: Diagnosis not present

## 2016-12-27 DIAGNOSIS — M542 Cervicalgia: Secondary | ICD-10-CM | POA: Diagnosis not present

## 2016-12-27 NOTE — Therapy (Signed)
[place], [place], [address] Phone: [phone]   Fax:  [phone]  Physical Therapy Treatment  Patient Details  Name: Dustin Kennedy MRN: [id number] Date of Birth: [birth year] Referring Provider: Dr Madilyn Fireman  Encounter Date: 12/27/2016      PT End of Session - 12/27/16 0808    Visit Number 8   Number of Visits 14   Date for PT Re-Evaluation 02/07/17   PT Start Time 0805   PT Stop Time 0901   PT Time Calculation (min) 56 min   Activity Tolerance Patient tolerated treatment well      Past Medical History:  Diagnosis Date  . Hyperlipidemia   . Hypertension     Past Surgical History:  Procedure Laterality Date  . lipomas removal     removal X 4  . LT shoulder surgery  1997   for bone spurs    There were no vitals filed for this visit.          [facility] PT Assessment - 12/27/16 0001      Assessment   Medical Diagnosis Headaches and neck pain   Referring Provider Dr Madilyn Fireman   Onset Date/Surgical Date 08/30/16   Hand Dominance Right   Next MD Visit PRN   Prior Therapy none     Observation/Other Assessments   Focus on Therapeutic Outcomes (FOTO)  33% limitation      Strength   Overall Strength Comments mid traps 5-/5 to 5/5'; lower traps Rt 5-/5 Lt 5/5    Right/Left Shoulder --  Bilat shoulders 5/5      Palpation   Spinal mobility hypomobile in cervical and thoracic spine with CPA mobs   Palpation comment improving tightness in bilat pecs, upper traps/levators, cervical paraspinals and periocciput muscles.                      [facility] Adult PT Treatment/Exercise - 12/27/16 0001      Neck Exercises: Machines for Strengthening   UBE (Upper Arm Bike) L4 x 3 min 1.5 fwd/1.5 back     Neck Exercises: Prone   Other Prone Exercise prone axial extension; shd flexion; T's W's      Shoulder Exercises: Standing   Extension Strengthening;Both;20 reps;Theraband   Theraband  Level (Shoulder Extension) Level 4 (Blue)   Row Strengthening;Both;20 reps;Theraband   Theraband Level (Shoulder Row) Level 4 (Blue)   Row Limitations row at 45 deg and 90 deg    Other Standing Exercises hand on ball at wall in ER for small circles x 20-30 circles      Moist Heat Therapy   Number Minutes Moist Heat 20 Minutes   Moist Heat Location Cervical     Electrical Stimulation   Electrical Stimulation Location bilat cervical    Electrical Stimulation Action IFC and estim with DN   Electrical Stimulation Parameters to tolerance   Electrical Stimulation Goals Tone;Pain     Manual Therapy   Manual therapy comments pt prone and supine    Joint Mobilization thoracic and lower cervical mobs CPA and lateral glides    Soft tissue mobilization occipital and cervical paraspinals   Myofascial Release posterior cervical upper trap   Passive ROM cervical spine into flexion; flexion with rotation; lateral flexion           Trigger Point Dry Needling - 12/27/16 0834    Consent Given? Yes   Muscles Treated Upper Body --  bilat    Upper  Trapezius Response Palpable increased muscle length   Oblique Capitus Response Palpable increased muscle length   SubOccipitals Response Palpable increased muscle length   Longissimus Response Palpable increased muscle length                   PT Long Term Goals - 12/27/16 0809      PT LONG TERM GOAL #1   Title I with HEP for postural realignment ( 02/07/17)    Time 10   Period Weeks   Status Partially Met     PT LONG TERM GOAL #2   Title Report =/< 75% reduction of headaches to allow him to sleep per his previous level ( 02/07/17)    Time 4   Period Weeks   Status Achieved     PT LONG TERM GOAL #3   Title improve FOTO =/< 25% limited ( 02/07/17)    Time 10   Status Revised     PT LONG TERM GOAL #4   Title report no need to leave work due to headaches for =/> 1 wk. ( 12/25/16)      PT LONG TERM GOAL #5   Title Independent in  posterior shoulder girdle strengthening program; anterior chest stretching 02/07/17   Time 6   Period Weeks   Status New               Plan - 12/27/16 0854    Clinical Impression Statement Patient continues to progress well with rehab. He has occasional HA which is most often associated with work activities. He demonstrates incresaed cervical mobility and improved posterior shoulder girdle strength. Sheena continues to have deep muscular tightness through the occipital and cervical spine musculature. He will benefit from continued PT to address this tightness - which is part of the underlying cause of headaches. Decrease frequency to 1x/wk    Rehab Potential Excellent   PT Frequency 1x / week   PT Duration 6 weeks  10 weeks total    PT Treatment/Interventions Moist Heat;Traction;Ultrasound;Therapeutic exercise;Therapeutic activities;Dry needling;Taping;Manual techniques;Neuromuscular re-education;Cryotherapy;Electrical Stimulation;Patient/family education   PT Next Visit Plan continue postural correction; DN; stretching; strengthening       Patient will benefit from skilled therapeutic intervention in order to improve the following deficits and impairments:  Postural dysfunction, Decreased strength, Hypomobility, Pain, Increased muscle spasms  Visit Diagnosis: Cervicalgia  Muscle spasm of back     Problem List Patient Active Problem List   Diagnosis Date Noted  . Headache syndrome 10/14/2016  . Osteoarthritis of left acromioclavicular joint 08/12/2012  . Obesity (BMI 30-39.9) 07/31/2012  . Rotator cuff syndrome of left shoulder 07/31/2012  . Insomnia 02/01/2011  . Hypogonadism in male 02/05/2010  . WEIGHT GAIN 10/20/2009  . ELEVATED BLOOD PRESSURE WITHOUT DIAGNOSIS OF HYPERTENSION 10/20/2009    Emmagrace Runkel Nilda Simmer PT, MPH  12/27/2016, 9:04 AM  [facility] [place] [address], [place], [address] Phone: [phone]    Fax:  [phone]  Name: Davyon Fisch MRN: [id number] Date of Birth: 1966-05-14

## 2016-12-31 DIAGNOSIS — I83891 Varicose veins of right lower extremities with other complications: Secondary | ICD-10-CM | POA: Diagnosis not present

## 2017-01-01 ENCOUNTER — Ambulatory Visit (INDEPENDENT_AMBULATORY_CARE_PROVIDER_SITE_OTHER): Payer: BLUE CROSS/BLUE SHIELD | Admitting: Physical Therapy

## 2017-01-01 ENCOUNTER — Encounter: Payer: Self-pay | Admitting: Physical Therapy

## 2017-01-01 ENCOUNTER — Ambulatory Visit: Payer: BLUE CROSS/BLUE SHIELD | Admitting: Neurology

## 2017-01-01 DIAGNOSIS — M62830 Muscle spasm of back: Secondary | ICD-10-CM | POA: Diagnosis not present

## 2017-01-01 DIAGNOSIS — M542 Cervicalgia: Secondary | ICD-10-CM

## 2017-01-01 NOTE — Therapy (Signed)
[place], [place], [address] Phone: [phone]   Fax:  [phone]  Physical Therapy Treatment  Patient Details  Name: Dustin Kennedy MRN: [id number] Date of Birth: 02-20-1966 Referring Provider: Dr Madilyn Fireman  Encounter Date: 01/01/2017      PT End of Session - 01/01/17 0802    Visit Number 9   Number of Visits 14   Date for PT Re-Evaluation 02/07/17   PT Start Time 0802   PT Stop Time 0909   PT Time Calculation (min) 67 min   Activity Tolerance Patient tolerated treatment well      Past Medical History:  Diagnosis Date  . Hyperlipidemia   . Hypertension     Past Surgical History:  Procedure Laterality Date  . lipomas removal     removal X 4  . LT shoulder surgery  1997   for bone spurs    There were no vitals filed for this visit.      Subjective Assessment - 01/01/17 0802    Subjective Chirag reports he will still have neck pain at times. Uses icy hot.  He is going to look at pillows and try to make some changes. 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  So he can't miss work.    Currently in Pain? Yes   Pain Score 1    Pain Location Neck   Pain Orientation Right;Left   Pain Descriptors / Indicators Dull   Pain Type Acute pain   Pain Onset More than a month ago   Pain Frequency Intermittent   Aggravating Factors  posture and stress   Pain Relieving Factors needling                         [facility] Adult PT Treatment/Exercise - 01/01/17 0001      Neck Exercises: Machines for Strengthening   UBE (Upper Arm Bike) L4 x 3 min 1.5 fwd/1.5 back     Neck Exercises: Supine   Cervical Isometrics Extension;20 reps  press into ball off EOB   Other Supine Exercise deep neck flexor ex 4x5 reps     Neck Exercises: Prone   Other Prone Exercise POE cervical diagonals 2x 10 each side.      Modalities   Modalities Electrical Stimulation;Moist Heat     Moist Heat Therapy   Number Minutes Moist Heat 20 Minutes   Moist Heat Location Cervical     Electrical Stimulation   Electrical Stimulation Location bilat cervical    Electrical Stimulation Action IFC   Electrical Stimulation Parameters to tolerance   Electrical Stimulation Goals Tone;Pain     Manual Therapy   Joint Mobilization thoracic/cervical CPA and bilat UPA mobs.    Soft tissue mobilization Rt upper trap/levator and cervical paraspinals STM     Neck Exercises: Stretches   Upper Trapezius Stretch 2 reps;30 seconds          Trigger Point Dry Needling - 01/01/17 0824    Consent Given? Yes   Muscles Treated Upper Body Upper trapezius;Levator scapulae   Upper Trapezius Response Twitch reponse elicited;Palpable increased muscle length  Rt    Levator Scapulae Response Palpable increased muscle length;Twitch response elicited  Rt                   PT Long Term Goals - 01/01/17 0814      PT LONG  TERM GOAL #1   Title I with HEP for postural realignment ( 02/07/17)    Status Partially Met     PT LONG TERM GOAL #2   Title Report =/< 75% reduction of headaches to allow him to sleep per his previous level ( 02/07/17)    Status Achieved     PT LONG TERM GOAL #3   Title improve FOTO =/< 25% limited ( 02/07/17)    Status On-going     PT LONG TERM GOAL #4   Title report no need to leave work due to headaches for =/> 1 wk. ( 12/25/16)    Status Achieved     PT LONG TERM GOAL #5   Title Independent in posterior shoulder girdle strengthening program; anterior chest stretching 02/07/17   Status On-going               Plan - 01/01/17 0815    Clinical Impression Statement Lenard had some increase in neck/HA pain over the last week.  Did have some training and was on the computer more.  He fatigued with deep neck stengthening exercise. No new goals met.  Did have decreased tightness in the Rt upper trap/levator  after treatment   Rehab Potential Excellent   PT Frequency 1x / week   PT Duration 6 weeks   PT Treatment/Interventions Moist Heat;Traction;Ultrasound;Therapeutic exercise;Therapeutic activities;Dry needling;Taping;Manual techniques;Neuromuscular re-education;Cryotherapy;Electrical Stimulation;Patient/family education   PT Next Visit Plan continue postural correction; DN; stretching; strengthening    Consulted and Agree with Plan of Care Patient      Patient will benefit from skilled therapeutic intervention in order to improve the following deficits and impairments:  Postural dysfunction, Decreased strength, Hypomobility, Pain, Increased muscle spasms  Visit Diagnosis: Cervicalgia  Muscle spasm of back     Problem List Patient Active Problem List   Diagnosis Date Noted  . Headache syndrome 10/14/2016  . Osteoarthritis of left acromioclavicular joint 08/12/2012  . Obesity (BMI 30-39.9) 07/31/2012  . Rotator cuff syndrome of left shoulder 07/31/2012  . Insomnia 02/01/2011  . Hypogonadism in male 02/05/2010  . WEIGHT GAIN 10/20/2009  . ELEVATED BLOOD PRESSURE WITHOUT DIAGNOSIS OF HYPERTENSION 10/20/2009    Jeral Pinch PT  01/01/2017, 10:01 AM  [facility] [place], [place], [address] Phone: [phone]   Fax:  [phone]  Name: Halo Laski MRN: [id number] Date of Birth: February 08, 1966

## 2017-01-02 ENCOUNTER — Institutional Professional Consult (permissible substitution): Payer: BLUE CROSS/BLUE SHIELD | Admitting: Neurology

## 2017-01-08 ENCOUNTER — Ambulatory Visit (INDEPENDENT_AMBULATORY_CARE_PROVIDER_SITE_OTHER): Payer: BLUE CROSS/BLUE SHIELD | Admitting: Physical Therapy

## 2017-01-08 ENCOUNTER — Encounter: Payer: Self-pay | Admitting: Physical Therapy

## 2017-01-08 DIAGNOSIS — M542 Cervicalgia: Secondary | ICD-10-CM

## 2017-01-08 DIAGNOSIS — M62830 Muscle spasm of back: Secondary | ICD-10-CM

## 2017-01-08 NOTE — Therapy (Addendum)
[place], [place], [address] Phone: [phone]   Fax:  [phone]  Physical Therapy Treatment  Patient Details  Name: Dustin Kennedy MRN: [id number] Date of Birth: [birth year] Referring Provider: Dr Madilyn Fireman  Encounter Date: 01/08/2017      PT End of Session - 01/08/17 0759    Visit Number 10   Number of Visits 14   Date for PT Re-Evaluation 02/07/17   PT Start Time 0759   PT Stop Time 0842   PT Time Calculation (min) 43 min   Activity Tolerance Patient tolerated treatment well      Past Medical History:  Diagnosis Date  . Hyperlipidemia   . Hypertension     Past Surgical History:  Procedure Laterality Date  . lipomas removal     removal X 4  . LT shoulder surgery  1997   for bone spurs    There were no vitals filed for this visit.      Subjective Assessment - 01/08/17 0801    Subjective Dustin Kennedy reports he has noticed some popping in the Rt upper trap with performing shoulder shrugs.  Had one headache over the weekend lasting 2-3 hrs, pain 3-4/10 in his neck over the weekend.    Patient Stated Goals get rid of headaches.  So he can't miss work.    Currently in Pain? Yes   Pain Score 2    Pain Location Neck   Pain Orientation Left;Right   Pain Descriptors / Indicators Tightness;Throbbing   Pain Type Acute pain   Pain Onset More than a month ago   Pain Frequency Intermittent   Aggravating Factors  prolonged sitting   Pain Relieving Factors stretching                         [facility] Adult PT Treatment/Exercise - 01/08/17 0001      Neck Exercises: Machines for Strengthening   UBE (Upper Arm Bike) L4x4' a;t FWD/BWD     Neck Exercises: Supine   Neck Retraction 10 reps  into pillow    Other Supine Exercise deep neck flexor ex 4x5 reps     Modalities   Modalities Electrical Stimulation;Moist Heat     Moist Heat Therapy   Number Minutes Moist Heat 10 Minutes   Moist Heat Location Cervical;Shoulder  RT under arm     Electrical Stimulation   Electrical Stimulation Location bilat cervical    Electrical Stimulation Action burst   Electrical Stimulation Parameters to tolerance   Electrical Stimulation Goals Tone;Pain     Manual Therapy   Manual Therapy Soft tissue mobilization   Soft tissue mobilization Rt upper trap/levator and posterior Rt shoulder muscles          Trigger Point Dry Needling - 01/08/17 3818    Consent Given? Yes   Education Handout Provided No   Muscles Treated Upper Body Upper trapezius;Infraspinatus;Subscapularis  Teres minor/major - all Rt side                   PT Long Term Goals - 01/01/17 2993      PT LONG TERM GOAL #1   Title I with HEP for postural realignment ( 02/07/17)    Status Partially Met     PT LONG TERM GOAL #2   Title Report =/< 75% reduction of headaches to allow him to sleep per his previous level ( 02/07/17)    Status Achieved  PT LONG TERM GOAL #3   Title improve FOTO =/< 25% limited ( 02/07/17)    Status On-going     PT LONG TERM GOAL #4   Title report no need to leave work due to headaches for =/> 1 wk. ( 12/25/16)    Status Achieved     PT LONG TERM GOAL #5   Title Independent in posterior shoulder girdle strengthening program; anterior chest stretching 02/07/17   Status On-going               Plan - 01/08/17 0835    Clinical Impression Statement Dustin Kennedy has some change in symptoms to include popping in the Rt shoulder with motion.  Responded well to manual work today and didn't have the popping after tx.  No new goals met from last week. Continues with forward head, rounded shoulder posutring that effect his mobility and muscles   Rehab Potential Excellent   PT Frequency 1x / week   PT Duration 6 weeks   PT Treatment/Interventions Moist Heat;Traction;Ultrasound;Therapeutic exercise;Therapeutic activities;Dry needling;Taping;Manual techniques;Neuromuscular  re-education;Cryotherapy;Electrical Stimulation;Patient/family education   PT Next Visit Plan assess Rt shoulder popping since last tx. Possible DN/manual to Lt side   Consulted and Agree with Plan of Care Patient      Patient will benefit from skilled therapeutic intervention in order to improve the following deficits and impairments:  Postural dysfunction, Decreased strength, Hypomobility, Pain, Increased muscle spasms  Visit Diagnosis: Muscle spasm of back  Cervicalgia     Problem List Patient Active Problem List   Diagnosis Date Noted  . Headache syndrome 10/14/2016  . Osteoarthritis of left acromioclavicular joint 08/12/2012  . Obesity (BMI 30-39.9) 07/31/2012  . Rotator cuff syndrome of left shoulder 07/31/2012  . Insomnia 02/01/2011  . Hypogonadism in male 02/05/2010  . WEIGHT GAIN 10/20/2009  . ELEVATED BLOOD PRESSURE WITHOUT DIAGNOSIS OF HYPERTENSION 10/20/2009    Dustin Kennedy PT  01/08/2017, 8:38 AM  [facility] [place], [place], [address] Phone: [phone]   Fax:  [phone]  Name: Dustin Kennedy MRN: [id number] Date of Birth: 1966/01/06  PHYSICAL THERAPY DISCHARGE SUMMARY  Visits from Start of Care: 10  Current functional level related to goals / functional outcomes: Significant improvement in symptoms with PT and home program as well as work and home modifications. See progress note for discharge status.   Remaining deficits: Continued intermittent tightness posterior cervical spine musculature    Education / Equipment: HEP  Plan: Patient agrees to discharge.  Patient goals were partially met. Patient is being discharged due to not returning since the last visit.  ?????     Dustin Kennedy PT, MPH 02/04/17 8:25 AM

## 2017-01-14 ENCOUNTER — Encounter: Payer: BLUE CROSS/BLUE SHIELD | Admitting: Physical Therapy

## 2017-01-21 ENCOUNTER — Encounter: Payer: BLUE CROSS/BLUE SHIELD | Admitting: Physical Therapy

## 2017-02-11 DIAGNOSIS — I872 Venous insufficiency (chronic) (peripheral): Secondary | ICD-10-CM | POA: Diagnosis not present

## 2017-02-11 DIAGNOSIS — I83891 Varicose veins of right lower extremities with other complications: Secondary | ICD-10-CM | POA: Diagnosis not present

## 2017-03-11 DIAGNOSIS — I83891 Varicose veins of right lower extremities with other complications: Secondary | ICD-10-CM | POA: Diagnosis not present

## 2017-03-11 DIAGNOSIS — I872 Venous insufficiency (chronic) (peripheral): Secondary | ICD-10-CM | POA: Diagnosis not present

## 2017-05-19 ENCOUNTER — Encounter: Payer: Self-pay | Admitting: Family Medicine

## 2017-05-21 MED ORDER — DOXYCYCLINE HYCLATE 100 MG PO TABS: 100.0000 mg | ORAL_TABLET | Freq: Two times a day (BID) | ORAL | 0 refills | Status: DC

## 2017-05-22 ENCOUNTER — Ambulatory Visit (INDEPENDENT_AMBULATORY_CARE_PROVIDER_SITE_OTHER): Payer: BLUE CROSS/BLUE SHIELD | Admitting: Family Medicine

## 2017-05-22 ENCOUNTER — Encounter: Payer: Self-pay | Admitting: Family Medicine

## 2017-05-22 VITALS — BP 127/68 | HR 70 | Ht 70.0 in | Wt 252.0 lb

## 2017-05-22 DIAGNOSIS — L089 Local infection of the skin and subcutaneous tissue, unspecified: Secondary | ICD-10-CM | POA: Diagnosis not present

## 2017-05-22 DIAGNOSIS — L723 Sebaceous cyst: Secondary | ICD-10-CM | POA: Diagnosis not present

## 2017-05-22 NOTE — Progress Notes (Signed)
   Subjective:    Patient ID: Dustin Kennedy, male    DOB: 01/08/1966, 51 y.o.   MRN: [id number]  HPI  51 year old male comes in today with an inflamed knot on the back of his neck.  He does feel like it has been gradually getting larger over the last several months but over the weekend it felt more tender and his wife noticed that it was bright red.  He actually sent us a message and actually called in doxycycline today as he made an appointment today for us to check it.  He does feel like it looks a little bit better today and less angry and red.  No fevers chills or sweats.  Review of Systems     Objective:   Physical Exam  Constitutional: He is oriented to person, place, and time. He appears well-developed and well-nourished.  HENT:  Head: Normocephalic and atraumatic.  Eyes: Conjunctivae and EOM are normal.  Cardiovascular: Normal rate.   Pulmonary/Chest: Effort normal.  Neurological: He is alert and oriented to person, place, and time.  Skin: Skin is dry. No pallor.  3 x 4 sebaceous cyst posterior neck.  It is tender and inflamed with some surrounding erythema.  No active drainage or pustule in the center.  Psychiatric: He has a normal mood and affect. His behavior is normal.  Vitals reviewed.         Assessment & Plan:  Infected sebaceous cyst-recommend I&D today to help clear the infection out more quickly.  Continue the doxycycline which was actually started yesterday.  Call if any change in the wound if it is getting worse any fever.  And then follow-up in 1 month for definitive excision.  At that point most of the inflammation should be completely resolved in the says should be at its smallest.  Incision and Drainage Procedure Note  Pre-operative Diagnosis: Infected sebaceous cyst  Post-operative Diagnosis: same  Indications: pain, tenderness   Anesthesia: 1% lidocaine with epinephrine  Procedure Details  The procedure, risks and complications have been discussed in  detail (including, but not limited to airway compromise, infection, bleeding) with the patient, and the patient has signed consent to the procedure.  The skin was sterilely prepped and draped over the affected area in the usual fashion. After adequate local anesthesia, I&D with a #11 blade was performed on the posterior neck . Purulent drainage: present.  Wound was probed with a sterile applicator to make sure that there were no tracts in place.  And part of the cyst wall was removed with forceps.  Unable to completely remove the entire wall. The patient was observed until stable.  Findings: Sebum and pus  EBL: trace Drains: Iodinated gauze placed.  Condition: Tolerated procedure well   Complications: none.

## 2017-05-22 NOTE — Patient Instructions (Addendum)
Keep covered for about 24 hours.  Okay to remove the bandage will the string back about an inch.  Okay to trim but make sure to leave a tail.  Cover again for another 24 hours and then repeat.  Continue to do this until the string is completely out.  This will allow any type of drainage or infection to continue to heal from the bottom out.  Make sure to complete the doxycycline.  Follow-up in 1 month with Dr. Benjamin Stainhekkekandam for full excision of the cyst.    Excision of Skin Lesions, Care After Refer to this sheet in the next few weeks. These instructions provide you with information about caring for yourself after your procedure. Your health care provider may also give you more specific instructions. Your treatment has been planned according to current medical practices, but problems sometimes occur. Call your health care provider if you have any problems or questions after your procedure. What can I expect after the procedure? After your procedure, it is common to have pain or discomfort at the excision site. Follow these instructions at home:  Take over-the-counter and prescription medicines only as told by your health care provider.  Follow instructions from your health care provider about: ? How to take care of your excision site. You should keep the site clean, dry, and protected for at least 48 hours. ? When and how you should change your bandage (dressing). ? When you should remove your dressing. ? Removing whatever was used to close your excision site.  Check the excision area every day for signs of infection. Watch for: ? Redness, swelling, or pain. ? Fluid, blood, or pus.  For bleeding, apply gentle but firm pressure to the area using a folded towel for 20 minutes.  Avoid high-impact exercise and activities until the stitches (sutures) are removed or the area heals.  Follow instructions from your health care provider about how to minimize scarring. Avoid sun exposure until the area  has healed. Scarring should lessen over time.  Keep all follow-up visits as told by your health care provider. This is important. Contact a health care provider if:  You have a fever.  You have redness, swelling, or pain at the excision site.  You have fluid, blood, or pus coming from the excision site.  You have ongoing bleeding at the excision site.  You have pain that does not improve in 2-3 days after your procedure.  You notice skin irregularities or changes in sensation. This information is not intended to replace advice given to you by your health care provider. Make sure you discuss any questions you have with your health care provider. Document Released: 11/29/2014 Document Revised: 12/21/2015 Document Reviewed: 08/31/2014 Elsevier Interactive Patient Education  [employer]2018 Elsevier Inc.

## 2017-05-25 LAB — WOUND CULTURE
MICRO NUMBER:: 81197526
RESULT:: NO GROWTH
SPECIMEN QUALITY:: ADEQUATE

## 2017-06-13 ENCOUNTER — Encounter: Payer: Self-pay | Admitting: Family Medicine

## 2017-06-13 DIAGNOSIS — E291 Testicular hypofunction: Secondary | ICD-10-CM

## 2017-06-23 ENCOUNTER — Ambulatory Visit: Payer: BLUE CROSS/BLUE SHIELD | Admitting: Sports Medicine

## 2017-06-23 ENCOUNTER — Encounter: Payer: Self-pay | Admitting: Sports Medicine

## 2017-06-23 DIAGNOSIS — E291 Testicular hypofunction: Secondary | ICD-10-CM | POA: Diagnosis not present

## 2017-06-23 DIAGNOSIS — L723 Sebaceous cyst: Secondary | ICD-10-CM | POA: Diagnosis not present

## 2017-06-23 NOTE — Progress Notes (Signed)
  Subjective:    CC: Skin lesion  HPI: [name] is a pleasant 51 year old male, he had a sebaceous cyst, infected but needed an incision and drainage, this went well, its calmed down, no longer erythematous but mildly tender.  On the back of his neck.  Moderate, persistent.  He cannot do the formal excision surgery today, but can come back later this week for it.  Past medical history:  Negative.  See flowsheet/record as well for more information.  Surgical history: Negative.  See flowsheet/record as well for more information.  Family history: Negative.  See flowsheet/record as well for more information.  Social history: Negative.  See flowsheet/record as well for more information.  Allergies, and medications have been entered into the medical record, reviewed, and no changes needed.   Review of Systems: No fevers, chills, night sweats, weight loss, chest pain, or shortness of breath.   Objective:    General: Well Developed, well nourished, and in no acute distress.  Neuro: Alert and oriented x3, extra-ocular muscles intact, sensation grossly intact.  HEENT: Normocephalic, atraumatic, pupils equal round reactive to light, neck supple, no masses, no lymphadenopathy, thyroid nonpalpable.  Skin: Warm and dry, no rashes.   There is a 1-2 cm well circumscribed lesion in the subcutaneous tissues of the nape of his neck.   Cardiac: Regular rate and rhythm, no murmurs rubs or gallops, no lower extremity edema.  Respiratory: Clear to auscultation bilaterally. Not using accessory muscles, speaking in full sentences.  Impression and Recommendations:    Sebaceous cyst back of neck Doing well post incision and drainage by Dr. Linford Kennedy. Cyst is calmed down significantly, he is going to return on Thursday for definitive surgical excision.  I spent 25 minutes with this patient, greater than 50% was face-to-face time counseling regarding the above  diagnoses ___________________________________________ Dustin Kennedy, M.D., ABFM., CAQSM. Primary Care and Sports Medicine [facility]  Adjunct Instructor of Family Medicine  [employer]

## 2017-06-23 NOTE — Assessment & Plan Note (Addendum)
Doing well post incision and drainage by Dr. Linford ArnoldMetheney. Cyst is calmed down significantly, he is going to return on Thursday for definitive surgical excision.

## 2017-06-24 ENCOUNTER — Encounter: Payer: Self-pay | Admitting: Family Medicine

## 2017-06-24 DIAGNOSIS — R635 Abnormal weight gain: Secondary | ICD-10-CM

## 2017-06-24 DIAGNOSIS — Z6836 Body mass index (BMI) 36.0-36.9, adult: Secondary | ICD-10-CM

## 2017-06-24 LAB — PSA: PSA: 0.7 ng/mL (ref <=4.0)

## 2017-06-24 LAB — TESTOSTERONE TOTAL,FREE,BIO, MALES
Albumin: 4.4 g/dL (ref 3.6–5.1)
SEX HORMONE BINDING: 32 nmol/L (ref 10–50)
Testosterone, Bioavailable: 73.2 ng/dL — ABNORMAL LOW (ref >=110.0)
Testosterone, Free: 36.4 pg/mL — ABNORMAL LOW (ref 46.0–224.0)
Testosterone: 280 ng/dL (ref 250–827)

## 2017-06-26 ENCOUNTER — Encounter: Payer: Self-pay | Admitting: Sports Medicine

## 2017-06-26 ENCOUNTER — Ambulatory Visit (INDEPENDENT_AMBULATORY_CARE_PROVIDER_SITE_OTHER): Payer: BLUE CROSS/BLUE SHIELD | Admitting: Sports Medicine

## 2017-06-26 DIAGNOSIS — L723 Sebaceous cyst: Secondary | ICD-10-CM | POA: Diagnosis not present

## 2017-06-26 MED ORDER — HYDROCODONE-ACETAMINOPHEN 5-325 MG PO TABS: 1.0000 | ORAL_TABLET | Freq: Three times a day (TID) | ORAL | 0 refills | Status: DC | PRN

## 2017-06-26 NOTE — Assessment & Plan Note (Signed)
Surgical excision as above, return in 10 days for suture removal. Hydrocodone for postprocedural pain. I did remove #2 sebaceous cysts, one encased in scar tissue and another one with capsule.

## 2017-06-26 NOTE — Patient Instructions (Signed)
Incision Care, Adult An incision is a surgical cut that is made through your skin. Most incisions are closed after surgery. Your incision may be closed with stitches (sutures), staples, skin glue, or adhesive strips. You may need to return to your health care provider to have sutures or staples removed. This may occur several days to several weeks after your surgery. The incision needs to be cared for properly to prevent infection. How to care for your incision Incision care   Follow instructions from your health care provider about how to take care of your incision. Make sure you: ? Wash your hands with soap and water before you change the bandage (dressing). If soap and water are not available, use hand sanitizer. ? Change your dressing as told by your health care provider. ? Leave sutures, skin glue, or adhesive strips in place. These skin closures may need to stay in place for 2 weeks or longer. If adhesive strip edges start to loosen and curl up, you may trim the loose edges. Do not remove adhesive strips completely unless your health care provider tells you to do that.  Check your incision area every day for signs of infection. Check for: ? More redness, swelling, or pain. ? More fluid or blood. ? Warmth. ? Pus or a bad smell.  Ask your health care provider how to clean the incision. This may include: ? Using mild soap and water. ? Using a clean towel to pat the incision dry after cleaning it. ? Applying a cream or ointment. Do this only as told by your health care provider. ? Covering the incision with a clean dressing.  Ask your health care provider when you can leave the incision uncovered.  Do not take baths, swim, or use a hot tub until your health care provider approves. Ask your health care provider if you can take showers. You may only be allowed to take sponge baths for bathing. Medicines  If you were prescribed an antibiotic medicine, cream, or ointment, take or apply the  antibiotic as told by your health care provider. Do not stop taking or applying the antibiotic even if your condition improves.  Take over-the-counter and prescription medicines only as told by your health care provider. General instructions  Limit movement around your incision to improve healing. ? Avoid straining, lifting, or exercise for the first month, or for as long as told by your health care provider. ? Follow instructions from your health care provider about returning to your normal activities. ? Ask your health care provider what activities are safe.  Protect your incision from the sun when you are outside for the first 6 months, or for as long as told by your health care provider. Apply sunscreen around the scar or cover it up.  Keep all follow-up visits as told by your health care provider. This is important. Contact a health care provider if:  Your have more redness, swelling, or pain around the incision.  You have more fluid or blood coming from the incision.  Your incision feels warm to the touch.  You have pus or a bad smell coming from the incision.  You have a fever or shaking chills.  You are nauseous or you vomit.  You are dizzy.  Your sutures or staples come undone. Get help right away if:  You have a red streak coming from your incision.  Your incision bleeds through the dressing and the bleeding does not stop with gentle pressure.  The edges of   your incision open up and separate.  You have severe pain.  You have a rash.  You are confused.  You faint.  You have trouble breathing and a fast heartbeat. This information is not intended to replace advice given to you by your health care provider. Make sure you discuss any questions you have with your health care provider. Document Released: 02/01/2005 Document Revised: 03/22/2016 Document Reviewed: 01/31/2016 Elsevier Interactive Patient Education  2018 Elsevier Inc.  

## 2017-06-26 NOTE — Progress Notes (Signed)
   Procedure:  Excision of #2 sebaceous cysts on the back of the neck, and total approximately 2 cm across. Risks, benefits, and alternatives explained and consent obtained. Time out conducted. Surface prepped with alcohol. 5cc lidocaine with epinephine infiltrated in a field block. Adequate anesthesia ensured. Area prepped and draped in a sterile fashion. Excision performed with: Using a #15 blade I made an elliptical incision around the sebaceous cyst, then using both sharp and blunt dissection I removed the cyst which was encased in scar tissue in its entirety, on further exploration of the wound I did find a second sebaceous cyst which was removed easily with its capsule.  I then placed #3 simple interrupted 4-0 Ethilon sutures to close the incision. Hemostasis achieved. Blood loss was minimal. Pt stable.

## 2017-06-27 MED ORDER — LIRAGLUTIDE -WEIGHT MANAGEMENT 18 MG/3ML ~~LOC~~ SOPN: PEN_INJECTOR | SUBCUTANEOUS | 1 refills | Status: AC

## 2017-06-30 ENCOUNTER — Other Ambulatory Visit: Payer: Self-pay | Admitting: Family Medicine

## 2017-06-30 MED ORDER — TESTOSTERONE 10 MG/ACT (2%) TD GEL: TRANSDERMAL | 1 refills | Status: DC

## 2017-06-30 NOTE — Progress Notes (Signed)
Rx sent for [place].

## 2017-07-01 ENCOUNTER — Telehealth: Payer: Self-pay | Admitting: *Deleted

## 2017-07-01 NOTE — Telephone Encounter (Signed)
Pre Authorization sent to cover my meds. [id number])

## 2017-07-07 ENCOUNTER — Ambulatory Visit: Payer: BLUE CROSS/BLUE SHIELD | Admitting: Sports Medicine

## 2017-07-08 ENCOUNTER — Telehealth: Payer: Self-pay | Admitting: *Deleted

## 2017-07-08 NOTE — Telephone Encounter (Signed)
Pre Authorization sent to cover my meds. [id number]

## 2017-07-14 NOTE — Telephone Encounter (Signed)
Please call patient and let him know that it was denied but that they will cover Belviq or Qsymia and please see if he has a preference.

## 2017-07-14 NOTE — Telephone Encounter (Signed)
Dustin CoveySAxenda has been denied. Patient must try and fail belviq and qsymia

## 2017-07-14 NOTE — Telephone Encounter (Signed)
Patient will try Belviq. Upon further review of his chart the patient did try Qsymia in 2015. So perhaps once he tries belviq and if it does not work we can [employer] Saxenda if he would like

## 2017-07-15 MED ORDER — LORCASERIN HCL ER 20 MG PO TB24: 1.0000 | ORAL_TABLET | Freq: Every day | ORAL | 1 refills | Status: DC

## 2017-07-15 NOTE — Telephone Encounter (Signed)
Okay, new prescription sent for about a week.  We may see if we have a coupon card for and see if he might want to pick it up or he may be able to go online and find one before he picks up the medication.  Also he will need to follow-up with me in 2 months.

## 2017-07-17 NOTE — Telephone Encounter (Signed)
A coupon card was mailed to the patient

## 2017-07-23 ENCOUNTER — Encounter: Payer: Self-pay | Admitting: Family Medicine

## 2017-07-24 ENCOUNTER — Telehealth: Payer: Self-pay | Admitting: *Deleted

## 2017-07-24 NOTE — Telephone Encounter (Signed)
closed

## 2017-07-24 NOTE — Telephone Encounter (Signed)
Testosterone was denied. Letter states only one lab was submitted however two labs were submitted. One low level was submitted and one low bioavailable level was submitted as sometimes with [employer] they will accept a low bioavailable result. Appeal letter submitted

## 2017-07-30 NOTE — Telephone Encounter (Signed)
Received fax on 07/30/17 indicating approval for Testosterone 10mg  gel. Fax placed in PA box.

## 2017-07-30 NOTE — Telephone Encounter (Signed)
Appeal was approved for testosterone.effective dates of auth are 07/08/17-07/18/2038. Pharmacy notified

## 2017-08-18 ENCOUNTER — Encounter: Payer: Self-pay | Admitting: Family Medicine

## 2017-08-19 ENCOUNTER — Telehealth: Payer: Self-pay | Admitting: Family Medicine

## 2017-08-19 NOTE — Telephone Encounter (Signed)
Received fax for PA on Belviq sent through cover my meds waiting on determination. - CF °

## 2017-08-22 NOTE — Telephone Encounter (Signed)
Did they say which other meds are covered? If not then patient will need to call his insurance to find out.

## 2017-08-22 NOTE — Telephone Encounter (Signed)
[facility] stating Belviq is denied due to it will not be covered when the member has failed an attempt at weight loss with another weight loss medication in the past 12 months. - CF  Reference # [id number]

## 2017-08-25 NOTE — Telephone Encounter (Signed)
I redid the pa on Belviq patient states he has not tried any weight loss medication in the last 12 months. I sent through cover my meds now I am waiting on determination. - CF

## 2017-08-29 ENCOUNTER — Telehealth: Payer: Self-pay | Admitting: Family Medicine

## 2017-08-29 NOTE — Telephone Encounter (Signed)
lvm informing pt of insurance denial and recommendations. Dustin Kennedy.[name].[name], [name], CMA

## 2017-08-29 NOTE — Telephone Encounter (Signed)
Please call patient and let him know we received a letter from [facility] where they have denied his bill Veeck XR.  We did try to resubmit it again to see if we can get it covered.  It says that they did not cover it because he has failed an attempt at weight loss with another medication in the past 12 months.  It says that he has been on a weight loss plan for at least 6 months and failed to lose weight before they will actually consider covering the medication.

## 2017-09-01 NOTE — Telephone Encounter (Signed)
[facility] and they approved Belviq after doing the additional review. I notified the pharmacy.   Reference [id number] Valid 08/19/2017 - 02/14/2018

## 2017-11-08 ENCOUNTER — Other Ambulatory Visit: Payer: Self-pay | Admitting: Family Medicine

## 2017-11-10 NOTE — Telephone Encounter (Signed)
[facility] requesting RF ob Belviq.  Please review and send if appropriate.    Thanks!

## 2017-11-10 NOTE — Telephone Encounter (Signed)
He needs to schedule f/u app.

## 2017-12-04 ENCOUNTER — Encounter: Payer: Self-pay | Admitting: Family Medicine

## 2017-12-05 MED ORDER — LORCASERIN HCL ER 20 MG PO TB24: 1.0000 | ORAL_TABLET | Freq: Every day | ORAL | 0 refills | Status: DC

## 2017-12-19 ENCOUNTER — Ambulatory Visit (INDEPENDENT_AMBULATORY_CARE_PROVIDER_SITE_OTHER): Payer: BLUE CROSS/BLUE SHIELD | Admitting: Family Medicine

## 2017-12-19 ENCOUNTER — Encounter: Payer: Self-pay | Admitting: Family Medicine

## 2017-12-19 VITALS — BP 127/64 | HR 70 | Ht 70.0 in | Wt 248.0 lb

## 2017-12-19 DIAGNOSIS — Z6835 Body mass index (BMI) 35.0-35.9, adult: Secondary | ICD-10-CM

## 2017-12-19 DIAGNOSIS — E6609 Other obesity due to excess calories: Secondary | ICD-10-CM

## 2017-12-19 DIAGNOSIS — E291 Testicular hypofunction: Secondary | ICD-10-CM

## 2017-12-19 DIAGNOSIS — I83813 Varicose veins of bilateral lower extremities with pain: Secondary | ICD-10-CM

## 2017-12-19 DIAGNOSIS — R635 Abnormal weight gain: Secondary | ICD-10-CM

## 2017-12-19 MED ORDER — TESTOSTERONE 20.25 MG/1.25GM (1.62%) TD GEL: 1.0000 | Freq: Every day | TRANSDERMAL | 5 refills | Status: DC

## 2017-12-19 NOTE — Patient Instructions (Signed)
Current weight: 248 pounds Previous weight: 265 pounds ( from November, but weighed 258 at home)  Change in weight:Down 14 lbs on home scale.  Dietary goals:Noom diet.   Exercise goals: Currently working at the gym 3 days/week with cardio and weights.  Plans to increase to 4 days/week. Goal weight:220 lbs.   Medication: Belviq.  Follow-up: 8 weeks for nurse visit.

## 2017-12-19 NOTE — Progress Notes (Signed)
Subjective:    Patient ID: Dustin Kennedy, male    DOB: 21-Jan-1966, 52 y.o.   MRN: [id number]  HPI 52 year old male is here today to follow-up for abnormal weight gain.  He is currently taking Belviq.  He was finally able to get it approved in February.  He is also been doing the Noom diet has been doing well with that.  He was mainly focusing on cardio and more recently has been going to the gym and adding weightlifting to his regimen.  He is currently going 3 days/week and plans out of fourth.  He has not had any chest pain shortness of breath or nausea.  Varicose veins-he is actually finally scheduled for surgery on June 10 he will have some vein stripping done and wanted to know where he can find some thigh-high compression stockings.  Hypogonadism-he says he is actually stopped the [place] gel because it was actually breaking his skin out.  He stopped it completely for 3 weeks and then restarted it and the same thing happened with redness itching and irritation.   Review of Systems  BP 127/64   Pulse 70   Ht  (1.778 m)   Wt 248 lb (112.5 kg)   SpO2 98%   BMI 35.58 kg/m     Allergies  Allergen Reactions  . Azucena Freed [Testosterone] Rash    Skin irritation    Past Medical History:  Diagnosis Date  . Hyperlipidemia   . Hypertension     Past Surgical History:  Procedure Laterality Date  . lipomas removal     removal X 4  . LT shoulder surgery  1997   for bone spurs    Social History   Socioeconomic History  . Marital status: Married    Spouse name: [name]  . Number of children: 2  . Years of education: College  . Highest education level: Not on file  Occupational History  . Occupation: [employer]  Social Needs  . Financial resource strain: Not on file  . Food insecurity:    Worry: Not on file    Inability: Not on file  . Transportation needs:    Medical: Not on file    Non-medical: Not on file  Tobacco Use  . Smoking status: Never Smoker  . Smokeless  tobacco: Never Used  Substance and Sexual Activity  . Alcohol use: Yes    Comment: 1-4 drinks per month or less  . Drug use: No  . 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  Lifestyle  . Physical activity:    Days per week: Not on file    Minutes per session: Not on file  . Stress: Not on file  Relationships  . Social connections:    Talks on phone: Not on file    Gets together: Not on file    Attends religious service: Not on file    Active member of club or organization: Not on file    Attends meetings of clubs or organizations: Not on file    Relationship status: Not on file  . Intimate partner violence:    Fear of current or ex partner: Not on file    Emotionally abused: Not on file    Physically abused: Not on file    Forced sexual activity: Not on file  Other Topics Concern  . Not on file  Social History Narrative   Lives w/ wife   Caffeine use: 2-3 cups coffee per day    Family History  Problem Relation Age of Onset  . Hypertension Unknown        family history  . Hyperlipidemia Unknown        family history  . Thyroid disease Unknown        family history  . Stroke Unknown        family history    Outpatient Encounter Medications as of 12/19/2017  Medication Sig  . Lorcaserin HCl ER (BELVIQ XR) 20 MG TB24 Take 1 tablet by mouth daily.  . Multiple Vitamins-Minerals (MULTIVITAMIN ADULT PO) Take by mouth.  . Omega-3 Fatty Acids (FISH OIL) 1000 MG CAPS Take by mouth.  . Testosterone (ANDROGEL) 20.25 MG/1.25GM (1.62%) GEL Place 1 Pump onto the skin daily. Each outer shoulder. 30 packets.  . [DISCONTINUED] HYDROcodone-acetaminophen (NORCO/VICODIN) 5-325 MG tablet Take 1 tablet by mouth every 8 (eight) hours as needed for moderate pain.  . [DISCONTINUED] Testosterone 10 MG/ACT (2%) GEL 1 pump to each inner thigh QAM.   No  facility-administered encounter medications on file as of 12/19/2017.          Objective:   Physical Exam  Constitutional: He is oriented to person, place, and time. He appears well-developed and well-nourished.  HENT:  Head: Normocephalic and atraumatic.  Cardiovascular: Normal rate, regular rhythm and normal heart sounds.  Pulmonary/Chest: Effort normal and breath sounds normal.  Neurological: He is alert and oriented to person, place, and time.  Skin: Skin is warm and dry.  Psychiatric: He has a normal mood and affect. His behavior is normal.       Assessment & Plan:    Abnormal weight gain/obesity/BMI 35 -overall he is really doing well with the Belviq.  I think he is losing weight consistently which is great.  He did go on vacation and gained 4 pounds back but then is Artie gotten that back off.  So I suspect that his weight loss would have been even better.  Continue to monitor have him follow back up in about 8 weeks.  Current weight: 248 pounds Previous weight: 265 pounds ( from November, but weighed 258 at home)  Change in weight:Down 14 lbs on home scale.  Dietary goals: Noom diet.   Exercise goals: Currently working at the gym 3 days/week with cardio and weights.  Plans to increase to 4 days/week. Goal weight: 220 lbs.   Medication: Belviq.  Follow-up: 8 weeks for nurse visit.  Hypogonadism-discontinue Azucena Freed and switch back to AndroGel which she has used in the past and did well without any reaction to the topical gel.  Varicose veins-we will for surgery for vein stripping. Can try Jasso compression stocking, probably a moderate grade but may want to speak to the vein specialist just to make sure that he is getting what they would recommend.

## 2017-12-26 ENCOUNTER — Telehealth: Payer: Self-pay | Admitting: Family Medicine

## 2017-12-26 NOTE — Telephone Encounter (Signed)
Received fax from [facility] that Testosterone Gel  was approved from 12/23/2017 through 07/28/2038. Pharmacy notified and forms sent to scan.   Reference ID: [id number].

## 2018-01-03 ENCOUNTER — Other Ambulatory Visit: Payer: Self-pay | Admitting: Family Medicine

## 2018-01-05 DIAGNOSIS — I83891 Varicose veins of right lower extremities with other complications: Secondary | ICD-10-CM | POA: Diagnosis not present

## 2018-01-05 HISTORY — PX: ENDOVENOUS ABLATION SAPHENOUS VEIN W/ LASER: SUR449

## 2018-01-05 NOTE — Telephone Encounter (Signed)
Sent to pcp for signature.Kanae Ignatowski Lynetta, CMA  

## 2018-01-12 DIAGNOSIS — I83891 Varicose veins of right lower extremities with other complications: Secondary | ICD-10-CM | POA: Diagnosis not present

## 2018-01-12 DIAGNOSIS — I872 Venous insufficiency (chronic) (peripheral): Secondary | ICD-10-CM | POA: Diagnosis not present

## 2018-02-09 ENCOUNTER — Other Ambulatory Visit: Payer: Self-pay | Admitting: Family Medicine

## 2018-02-15 ENCOUNTER — Other Ambulatory Visit: Payer: Self-pay | Admitting: Family Medicine

## 2018-02-16 MED ORDER — LORCASERIN HCL ER 20 MG PO TB24: 1.0000 | ORAL_TABLET | Freq: Every day | ORAL | 0 refills | Status: DC

## 2018-02-16 NOTE — Telephone Encounter (Signed)
Is this ok to fill since he has an appointment? Please advise.

## 2018-02-17 ENCOUNTER — Telehealth: Payer: Self-pay | Admitting: Family Medicine

## 2018-02-17 NOTE — Telephone Encounter (Signed)
Received fax from [employer] that Belviq requires a PA. Information has been sent to the insurance company. Awaiting determination.   

## 2018-02-19 NOTE — Telephone Encounter (Signed)
Approvedtoday 02/19/2018 Effective from 02/17/2018 through 08/15/2018. 2nd initial approval

## 2018-02-20 ENCOUNTER — Ambulatory Visit (INDEPENDENT_AMBULATORY_CARE_PROVIDER_SITE_OTHER): Payer: BLUE CROSS/BLUE SHIELD | Admitting: Family Medicine

## 2018-02-20 VITALS — BP 139/94 | HR 71 | Wt 247.0 lb

## 2018-02-20 DIAGNOSIS — R635 Abnormal weight gain: Secondary | ICD-10-CM

## 2018-02-20 DIAGNOSIS — E6609 Other obesity due to excess calories: Secondary | ICD-10-CM | POA: Diagnosis not present

## 2018-02-20 DIAGNOSIS — Z6835 Body mass index (BMI) 35.0-35.9, adult: Secondary | ICD-10-CM

## 2018-02-20 MED ORDER — LORCASERIN HCL ER 20 MG PO TB24: 1.0000 | ORAL_TABLET | Freq: Every day | ORAL | 0 refills | Status: DC

## 2018-02-20 NOTE — Progress Notes (Signed)
Nash DimmerKerry presents to clinic for a weight check.  He is currently taking belviq.  He has lost 1 lb within the last 2 months.  Patient blood pressure was elevated at 151/101 with pulse of 74.  I let him sit for about 5 minute or more and the second reading was 139/94 with pulse of 71.  Patient notified that this will be routed to PCP and he will get a call with any recommendations. -EH/RMA

## 2018-02-20 NOTE — Progress Notes (Signed)
Have him return in 1 month for BP check.  Agree with above.  [name], MD

## 2018-03-02 DIAGNOSIS — I872 Venous insufficiency (chronic) (peripheral): Secondary | ICD-10-CM | POA: Diagnosis not present

## 2018-03-02 DIAGNOSIS — Z9889 Other specified postprocedural states: Secondary | ICD-10-CM | POA: Diagnosis not present

## 2018-03-02 DIAGNOSIS — I83891 Varicose veins of right lower extremities with other complications: Secondary | ICD-10-CM | POA: Diagnosis not present

## 2018-03-02 DIAGNOSIS — T8172XA Complication of vein following a procedure, not elsewhere classified, initial encounter: Secondary | ICD-10-CM | POA: Diagnosis not present

## 2018-03-29 ENCOUNTER — Other Ambulatory Visit: Payer: Self-pay | Admitting: Family Medicine

## 2018-03-31 ENCOUNTER — Encounter: Payer: Self-pay | Admitting: Family Medicine

## 2018-03-31 NOTE — Telephone Encounter (Signed)
Routed to pcp for review and signature..Mcgwire Dasaro Lynetta, CMA  

## 2018-05-08 ENCOUNTER — Other Ambulatory Visit: Payer: Self-pay | Admitting: Family Medicine

## 2018-05-31 IMAGING — CT CT HEAD W/O CM
1 series · 16 of 30 positions shown, 20 images · non-contrast
Comparison: None.

CLINICAL DATA: Severe headache.

EXAM:
CT HEAD WITHOUT CONTRAST
TECHNIQUE: Contiguous axial images were obtained from the base of the skull
through the vertex without intravenous contrast.

[Series 2: head w/(date) · axial · 0.44mm/px · z∈[-178,-33]mm · 16 of 33 slices shown, 20 images]
[im 2/33  brain]
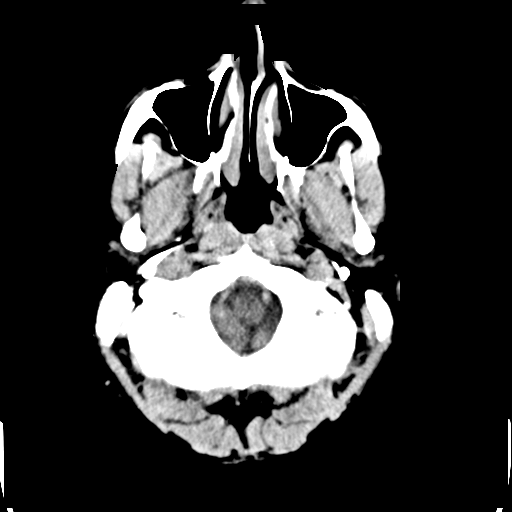
[im 2/33  bone]
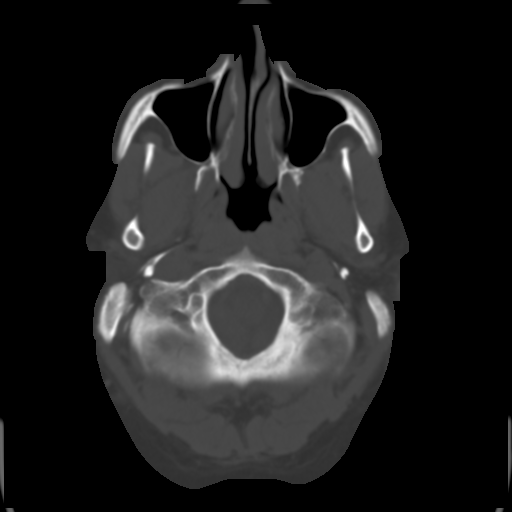
[im 4/33  brain]
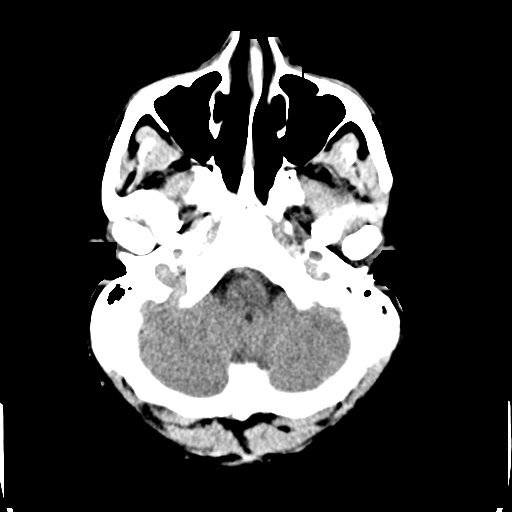
[im 6/33  brain]
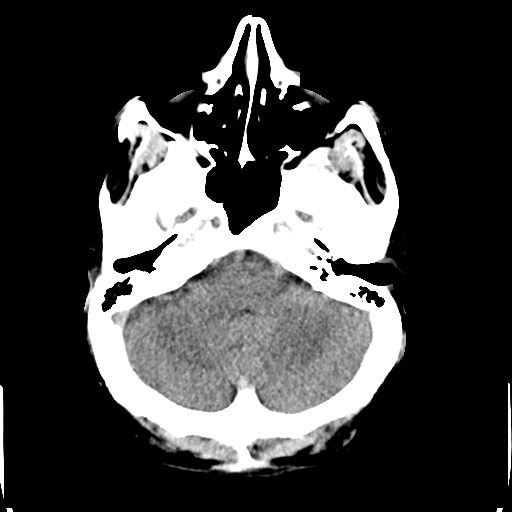
[im 8/33  brain]
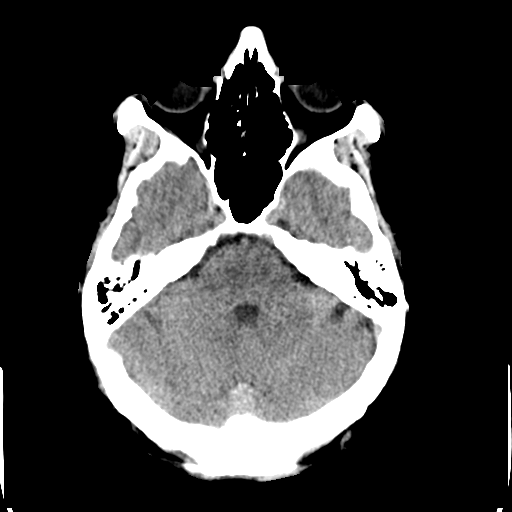
[im 9/33  brain]
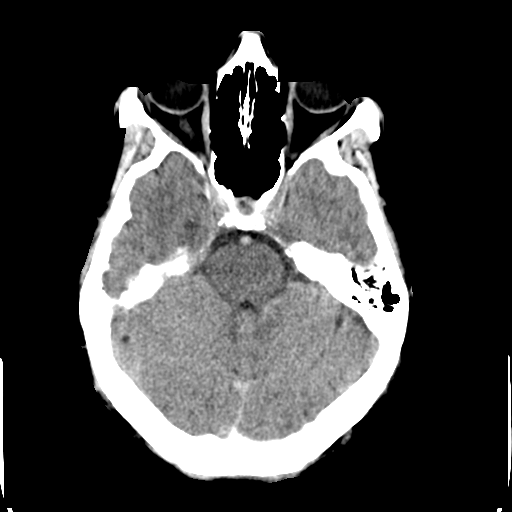
[im 9/33  bone]
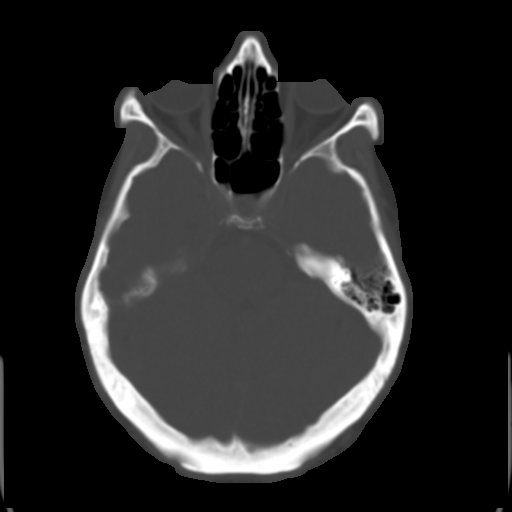
[im 12/33  brain]
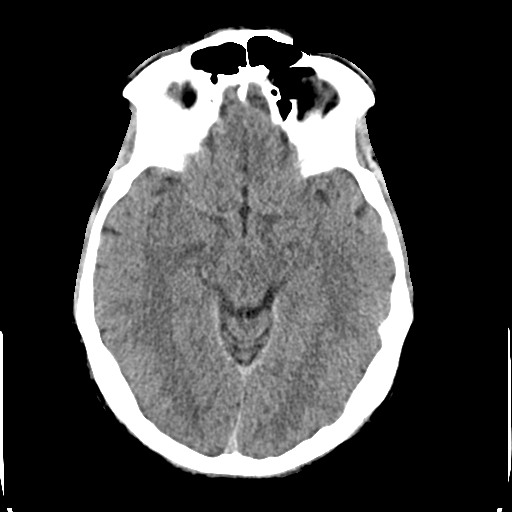
[im 14/33  brain]
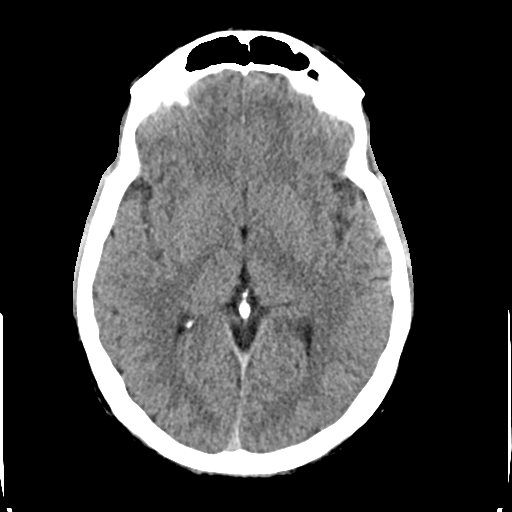
[im 16/33  brain]
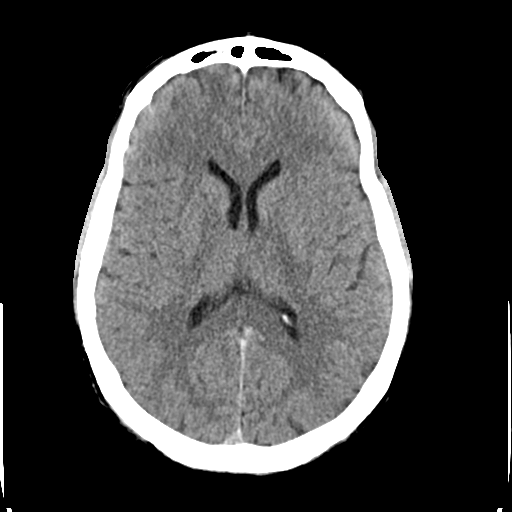
[im 17/33  brain]
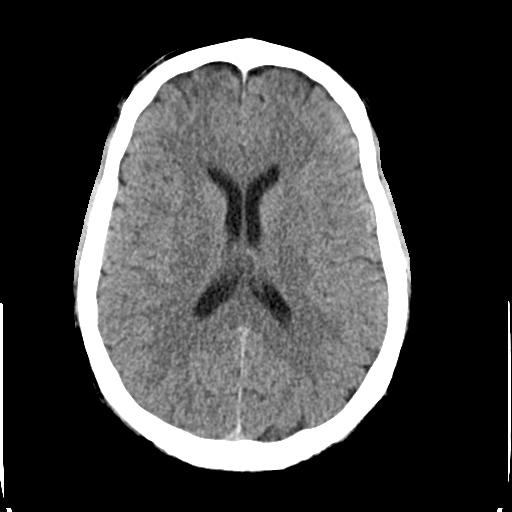
[im 17/33  bone]
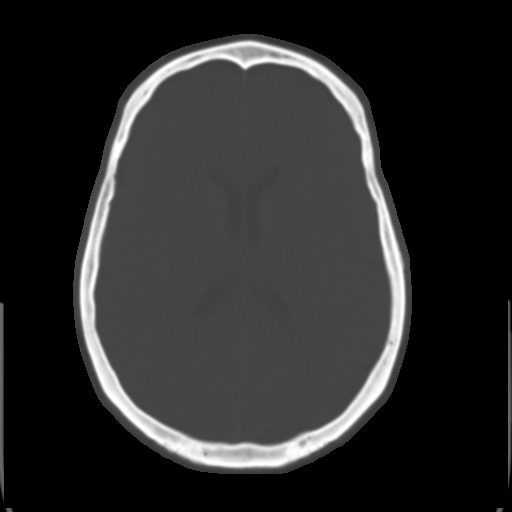
[im 19/33  brain]
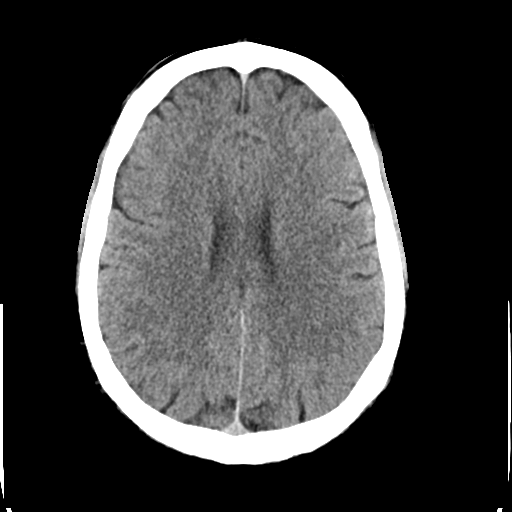
[im 21/33  brain]
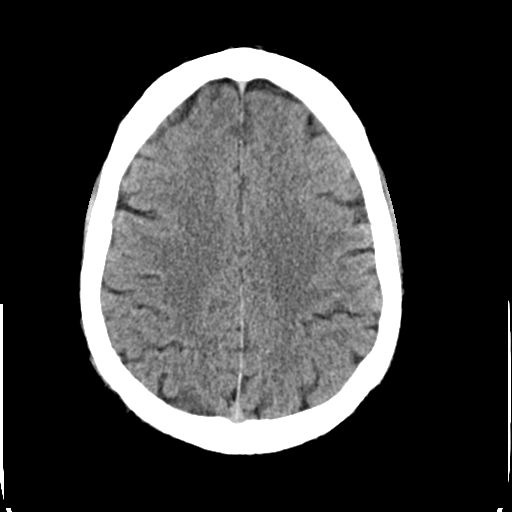
[im 24/33  brain]
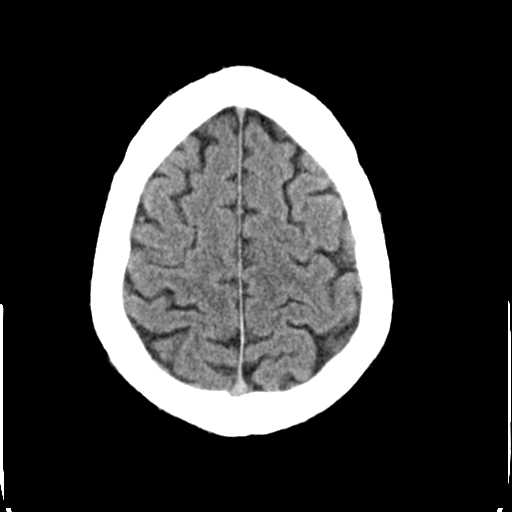
[im 25/33  brain]
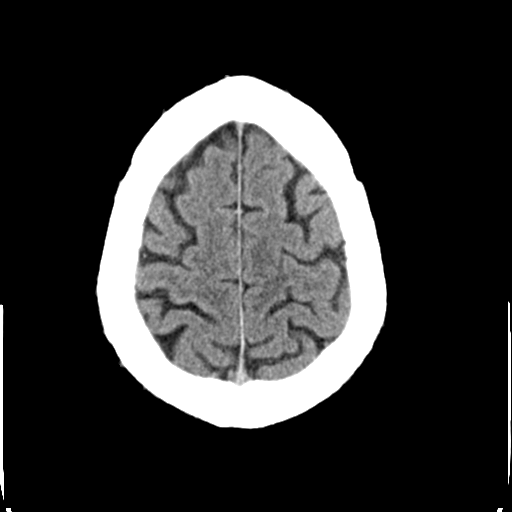
[im 25/33  bone]
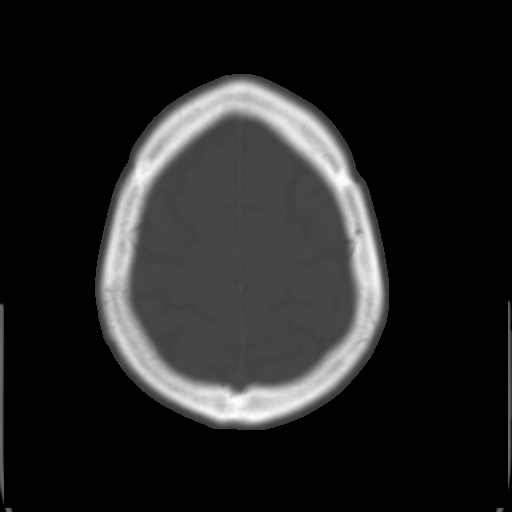
[im 27/33  brain]
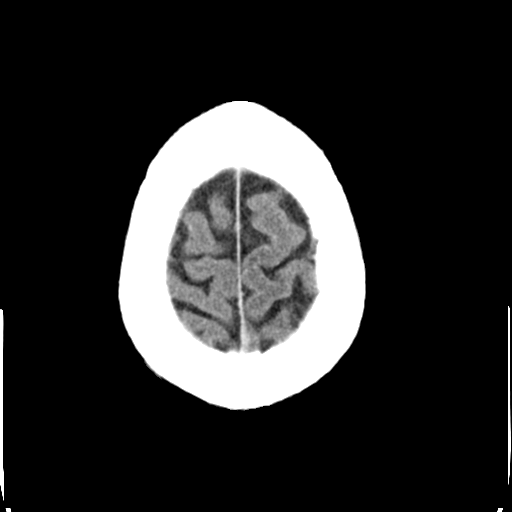
[im 29/33  brain]
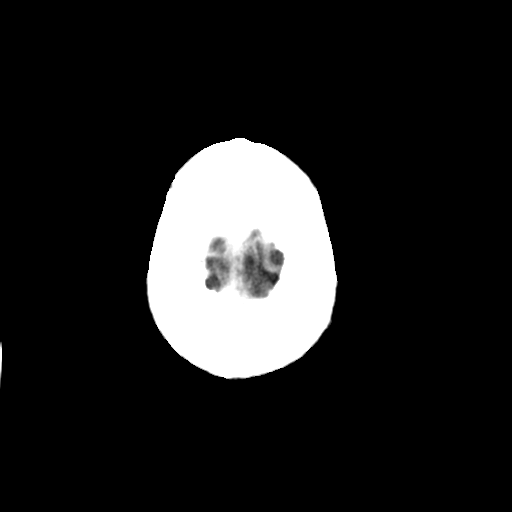
[im 31/33  brain]
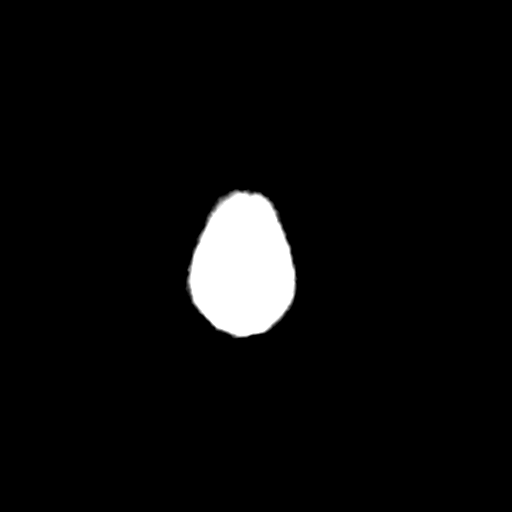

[16 of 30 positions shown; findings below may reference images not displayed]

FINDINGS: Brain: No evidence of acute or remote infarction, hemorrhage,
hydrocephalus, extra-axial collection or mass lesion/mass effect.

Vascular: Mild vascular calcification, including near the left ICA
terminus. No hyperdense vessel.

Skull: Normal. Negative for fracture or focal lesion.

Sinuses/Orbits: Negative
IMPRESSION: No acute finding or explanation for headache.

## 2018-06-14 ENCOUNTER — Other Ambulatory Visit: Payer: Self-pay | Admitting: Family Medicine

## 2018-07-19 ENCOUNTER — Other Ambulatory Visit: Payer: Self-pay | Admitting: Family Medicine

## 2018-07-19 DIAGNOSIS — E291 Testicular hypofunction: Secondary | ICD-10-CM

## 2018-07-20 ENCOUNTER — Other Ambulatory Visit: Payer: Self-pay | Admitting: Family Medicine

## 2018-07-20 ENCOUNTER — Encounter: Payer: Self-pay | Admitting: Family Medicine

## 2018-07-20 NOTE — Telephone Encounter (Signed)
Agree.  He needs testosterone and PSA as well as a CBC testing for us to continue to refill his testosterone.  He also needs to make an appointment for follow-up for weight loss on the Belviq.

## 2018-07-24 MED ORDER — LORCASERIN HCL ER 20 MG PO TB24: 1.0000 | ORAL_TABLET | Freq: Every day | ORAL | 0 refills | Status: DC

## 2018-07-24 NOTE — Telephone Encounter (Signed)
I sent the belviq but he does need to schedule an appt.  It looks like he should be good on the testos until Feb unless I am looking at it wrong.

## 2018-07-28 ENCOUNTER — Ambulatory Visit (INDEPENDENT_AMBULATORY_CARE_PROVIDER_SITE_OTHER): Payer: BLUE CROSS/BLUE SHIELD | Admitting: Family Medicine

## 2018-07-28 VITALS — BP 140/82 | HR 72 | Temp 97.2°F | Wt 253.0 lb

## 2018-07-28 DIAGNOSIS — R635 Abnormal weight gain: Secondary | ICD-10-CM

## 2018-07-28 DIAGNOSIS — E291 Testicular hypofunction: Secondary | ICD-10-CM | POA: Diagnosis not present

## 2018-07-28 MED ORDER — TESTOSTERONE 20.25 MG/ACT (1.62%) TD GEL: TRANSDERMAL | 0 refills | Status: DC

## 2018-07-28 NOTE — Progress Notes (Signed)
Pt in today for BP and weight check. Wt was 253 lbs fully dressed, pt states at home was 247 lbs this am. BP was 140/82 pulse 72, rechecked BP stayed the same. Pt states the pharmacy says he has no refills of testosterone, so he needs a refill sent to pharmacy. Pt has appt. Scheduled for January with provider.

## 2018-07-28 NOTE — Progress Notes (Signed)
Abnormal weight gain-we will continue with medication but will need to be monitored more carefully with regular office visits.  He does have a follow-up with me in about 2 weeks.  Hypogonadism-due for CBC, testosterone, and PSA.  We will try to get those ordered between now and follow-up visit.  Nani Gasseratherine Valeen Borys, MD

## 2018-07-28 NOTE — Telephone Encounter (Signed)
Called the pharmacy to clarify.  They actually are filling the 1.62% versus the 2% prescription.  Thus we will discontinue the 2% on file and we will send over a new prescription for the 1.62%.

## 2018-07-29 LAB — TESTOSTERONE: Testosterone: 189 ng/dL — ABNORMAL LOW (ref 250–827)

## 2018-07-30 DIAGNOSIS — H1131 Conjunctival hemorrhage, right eye: Secondary | ICD-10-CM | POA: Diagnosis not present

## 2018-08-04 ENCOUNTER — Encounter: Payer: Self-pay | Admitting: Family Medicine

## 2018-08-04 DIAGNOSIS — E291 Testicular hypofunction: Secondary | ICD-10-CM

## 2018-08-06 MED ORDER — TESTOSTERONE 20.25 MG/ACT (1.62%) TD GEL: TRANSDERMAL | 0 refills | Status: DC

## 2018-08-10 ENCOUNTER — Ambulatory Visit (INDEPENDENT_AMBULATORY_CARE_PROVIDER_SITE_OTHER): Payer: BLUE CROSS/BLUE SHIELD | Admitting: Family Medicine

## 2018-08-10 ENCOUNTER — Encounter: Payer: Self-pay | Admitting: Family Medicine

## 2018-08-10 VITALS — BP 134/79 | HR 70 | Ht 70.0 in | Wt 250.0 lb

## 2018-08-10 DIAGNOSIS — R635 Abnormal weight gain: Secondary | ICD-10-CM | POA: Diagnosis not present

## 2018-08-10 DIAGNOSIS — E291 Testicular hypofunction: Secondary | ICD-10-CM | POA: Diagnosis not present

## 2018-08-10 DIAGNOSIS — L821 Other seborrheic keratosis: Secondary | ICD-10-CM | POA: Diagnosis not present

## 2018-08-10 NOTE — Progress Notes (Addendum)
Subjective:    CC:   HPI:  F/U hypogonadism  - most recent testosterone level was on the lower end of normal.  We increased his dose to 3 pumps daily of the Androgel.  Notices a big different when on it and not on it. Says has much more energy with it.  He is exercising regularly.  He says even his wife notices a difference. Lab Results  Component Value Date   TESTOSTERONE 189 (L) 07/28/2018     F/U weight loss with Belviq.  He has lost 3 lbs since he was last here. Helps with curbing his appetite.    He is also requesting a referral to Dermtology. He has a couple of lesion on his face and would like a skin check with Derm.    Past medical history, Surgical history, Family history not pertinant except as noted below, Social history, Allergies, and medications have been entered into the medical record, reviewed, and corrections made.   Review of Systems: No fevers, chills, night sweats, weight loss, chest pain, or shortness of breath.   Objective:    General: Well Developed, well nourished, and in no acute distress.  Neuro: Alert and oriented x3, extra-ocular muscles intact, sensation grossly intact.  HEENT: Normocephalic, atraumatic  Skin: Warm and dry, no rashes.He has 2 seborrheaic keratoses at the hair line in frnt of the ear on the left side of face.  He also has some solar lentigo.  Cardiac: Regular rate and rhythm, no murmurs rubs or gallops, no lower extremity edema.  Respiratory: Clear to auscultation bilaterally. Not using accessory muscles, speaking in full sentences.   Impression and Recommendations:    Abnormal weight gain -doing well overall.  Since restarting the medication he is Artie lost 3 pounds.  Continue current regimen he works out regularly so continue with regular exercise.  In 2 months for nurse visit.  Hypogonadism - plan to recheck level in February.  He is now doing 3 pumps.  He says there was some time a problem with the insurance and the AndroGel so we  will look out for a prior authorization if he has any problems he can give [place] a call back.  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  They can be treated with cyrotherapy.  Also discussed solar lentigo.  These are caused by sun damage.  Will refer to Dermatology

## 2018-08-10 NOTE — Addendum Note (Signed)
Addended by: Nani Gasser D on: 08/10/2018 08:12 AM   Modules accepted: Orders, Level of Service

## 2018-08-14 ENCOUNTER — Encounter: Payer: Self-pay | Admitting: Family Medicine

## 2018-08-28 ENCOUNTER — Other Ambulatory Visit: Payer: Self-pay | Admitting: Family Medicine

## 2018-08-28 ENCOUNTER — Other Ambulatory Visit: Payer: Self-pay | Admitting: *Deleted

## 2018-08-28 DIAGNOSIS — E669 Obesity, unspecified: Secondary | ICD-10-CM

## 2018-08-28 MED ORDER — LORCASERIN HCL ER 20 MG PO TB24: 1.0000 | ORAL_TABLET | Freq: Every day | ORAL | 0 refills | Status: DC

## 2018-09-04 DIAGNOSIS — E291 Testicular hypofunction: Secondary | ICD-10-CM | POA: Diagnosis not present

## 2018-09-07 LAB — TESTOSTERONE TOTAL,FREE,BIO, MALES
Albumin: 4.5 g/dL (ref 3.6–5.1)
Sex Hormone Binding: 37 nmol/L (ref 10–50)
Testosterone, Bioavailable: 100.3 ng/dL — ABNORMAL LOW (ref >=110.0)
Testosterone, Free: 48.8 pg/mL (ref 46.0–224.0)
Testosterone: 409 ng/dL (ref 250–827)

## 2018-09-10 NOTE — Telephone Encounter (Signed)
Approvedtoday  Effective from 09/08/2018 through 09/07/2019. Pharmacy aware.

## 2018-10-09 ENCOUNTER — Ambulatory Visit: Payer: BLUE CROSS/BLUE SHIELD

## 2018-10-13 ENCOUNTER — Encounter: Payer: Self-pay | Admitting: Family Medicine

## 2018-10-13 DIAGNOSIS — R635 Abnormal weight gain: Secondary | ICD-10-CM

## 2018-10-14 NOTE — Telephone Encounter (Signed)
Yes, Belviq was pulled from the market for a link to increased cancer risk. I have not seen any reports about what specific type of cancer.   Are you interested in another weight loss medication?

## 2018-10-15 MED ORDER — LIRAGLUTIDE -WEIGHT MANAGEMENT 18 MG/3ML ~~LOC~~ SOPN: PEN_INJECTOR | SUBCUTANEOUS | 1 refills | Status: DC

## 2018-10-15 MED ORDER — PEN NEEDLES 32G X 5 MM MISC: 1.0000 [IU] | Freq: Every day | 1 refills | Status: DC

## 2018-10-15 NOTE — Addendum Note (Signed)
Addended by: Nani Gasser D on: 10/15/2018 03:05 PM   Modules accepted: Orders

## 2018-10-19 ENCOUNTER — Telehealth: Payer: Self-pay | Admitting: Family Medicine

## 2018-10-19 NOTE — Telephone Encounter (Signed)
Received fax from [employer] that Saxenda requires a PA. Information has been sent to the insurance company. Awaiting determination.   

## 2018-10-20 NOTE — Telephone Encounter (Signed)
Received a fax from [employer] that Dustin Kennedy has been approved from 10/19/2018 through 02/21/2019. Pharmacy aware and form sent to scan.

## 2018-10-23 ENCOUNTER — Encounter: Payer: Self-pay | Admitting: Family Medicine

## 2018-10-26 DIAGNOSIS — L570 Actinic keratosis: Secondary | ICD-10-CM | POA: Diagnosis not present

## 2018-10-26 DIAGNOSIS — L819 Disorder of pigmentation, unspecified: Secondary | ICD-10-CM | POA: Diagnosis not present

## 2018-10-26 MED ORDER — LIRAGLUTIDE -WEIGHT MANAGEMENT 18 MG/3ML ~~LOC~~ SOPN: PEN_INJECTOR | SUBCUTANEOUS | 0 refills | Status: AC

## 2018-11-23 ENCOUNTER — Other Ambulatory Visit: Payer: Self-pay | Admitting: Family Medicine

## 2018-12-14 ENCOUNTER — Other Ambulatory Visit: Payer: Self-pay | Admitting: Family Medicine

## 2019-05-12 DIAGNOSIS — Z23 Encounter for immunization: Secondary | ICD-10-CM | POA: Diagnosis not present

## 2019-06-22 ENCOUNTER — Other Ambulatory Visit: Payer: Self-pay | Admitting: Family Medicine

## 2019-06-22 ENCOUNTER — Other Ambulatory Visit: Payer: Self-pay | Admitting: *Deleted

## 2019-06-22 DIAGNOSIS — Z125 Encounter for screening for malignant neoplasm of prostate: Secondary | ICD-10-CM

## 2019-06-22 DIAGNOSIS — E291 Testicular hypofunction: Secondary | ICD-10-CM

## 2019-06-22 DIAGNOSIS — R030 Elevated blood-pressure reading, without diagnosis of hypertension: Secondary | ICD-10-CM

## 2019-06-22 NOTE — Telephone Encounter (Signed)
Patient is due for follow up and labs.

## 2019-06-23 ENCOUNTER — Encounter: Payer: Self-pay | Admitting: Family Medicine

## 2019-06-23 ENCOUNTER — Ambulatory Visit (INDEPENDENT_AMBULATORY_CARE_PROVIDER_SITE_OTHER): Payer: BC Managed Care – PPO | Admitting: Family Medicine

## 2019-06-23 DIAGNOSIS — E291 Testicular hypofunction: Secondary | ICD-10-CM | POA: Diagnosis not present

## 2019-06-23 MED ORDER — TESTOSTERONE 20.25 MG/ACT (1.62%) TD GEL: TRANSDERMAL | 3 refills | Status: DC

## 2019-06-23 NOTE — Progress Notes (Signed)
Virtual Visit via Video Note  I connected with Dustin Kennedy on 06/23/19 at  2:20 PM EST by a video enabled telemedicine application and verified that I am speaking with the correct person using two identifiers.   I discussed the limitations of evaluation and management by telemedicine and the availability of in person appointments. The patient expressed understanding and agreed to proceed.    Established Patient Office Visit  Subjective:  Patient ID: Dustin Kennedy, male    DOB: 1966-04-03  Age: 53 y.o. MRN: [id number]  CC:  Chief Complaint  Patient presents with  . Hypogonadism    HPI Dustin Kennedy presents for   Follow-up hypogonadism-last office visit was 7 months ago.  Is currently on the testosterone gel.  Flies a total of 3 pumps daily.he says it is working well he is happy with the results thus far.  He did report that if he forgets to take it and takes it later in the morning sometimes will get a little bit of a headache and just feel like he has a little bit more low energy.  He says it is noticeable enough that sometimes his wife will even comment about it.  He is applying to upper arm as he had a reaction to the [place] but has been doing better with the AndroGel.  So we will need to update the directions on his prescription.  He reports that he did get a flu vaccine this year.  Past Medical History:  Diagnosis Date  . Hyperlipidemia   . Hypertension     Past Surgical History:  Procedure Laterality Date  . lipomas removal     removal X 4  . LT shoulder surgery  1997   for bone spurs    Family History  Problem Relation Age of Onset  . Hypertension Unknown        family history  . Hyperlipidemia Unknown        family history  . Thyroid disease Unknown        family history  . Stroke Unknown        family history    Social History   Socioeconomic History  . Marital status: Married    Spouse name: Dustin Kennedy  . Number of children: 2  . Years of education:  College  . Highest education level: Not on file  Occupational History  . Occupation: [employer]  Social Needs  . Financial resource strain: Not on file  . Food insecurity    Worry: Not on file    Inability: Not on file  . Transportation needs    Medical: Not on file    Non-medical: Not on file  Tobacco Use  . Smoking status: Never Smoker  . Smokeless tobacco: Never Used  Substance and Sexual Activity  . Alcohol use: Yes    Comment: 1-4 drinks per month or less  . Drug use: No  . 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  Lifestyle  . Physical activity    Days per week: Not on file    Minutes per session: Not on file  . Stress: Not on file  Relationships  . Social Musician on phone: Not on file    Gets together: Not on file    Attends religious service: Not on file    Active member  of club or organization: Not on file    Attends meetings of clubs or organizations: Not on file    Relationship status: Not on file  . Intimate partner violence    Fear of current or ex partner: Not on file    Emotionally abused: Not on file    Physically abused: Not on file    Forced sexual activity: Not on file  Other Topics Concern  . Not on file  Social History Narrative   Lives w/ wife   Caffeine use: 2-3 cups coffee per day    Outpatient Medications Prior to Visit  Medication Sig Dispense Refill  . Multiple Vitamins-Minerals (MULTIVITAMIN ADULT PO) Take by mouth.    . Testosterone 20.25 MG/ACT (1.62%) GEL APPLY 1 PUMP TO 1 INNER THIGH AND 2 PUMPS TO OTHER INNER THIGH EVERY MORNING. TOTAL OF 3 PUMPS DAILY 150 g 1  . Insulin Pen Needle (PEN NEEDLES) 32G X 5 MM MISC 1 Units by Does not apply route daily. Use with Saxenda pen.  Okay to substitute for other gauge or length pen needle. (Patient not taking: Reported on 06/23/2019) 50 each 1  .  Liraglutide -Weight Management (SAXENDA) 18 MG/3ML SOPN Inject 2.4 mg into the skin daily. (Patient not taking: Reported on 06/23/2019) 15 mL 6  . Lorcaserin HCl ER (BELVIQ XR) 20 MG TB24 Take 1 tablet by mouth daily. (Patient not taking: Reported on 06/23/2019) 30 tablet 0   No facility-administered medications prior to visit.     Allergies  Allergen Reactions  . Harlene Salts [Testosterone] Rash    Skin irritation    ROS Review of Systems    Objective:    Physical Exam  Temp (!) 97.1 F (36.2 C) (Oral)   Ht 5\' 10"  (1.778 m)   Wt 250 lb (113.4 kg)   BMI 35.87 kg/m  Wt Readings from Last 3 Encounters:  06/23/19 250 lb (113.4 kg)  08/10/18 250 lb (113.4 kg)  07/28/18 253 lb (114.8 kg)     There are no preventive care reminders to display for this patient.  There are no preventive care reminders to display for this patient.  Lab Results  Component Value Date   TSH 1.44 10/01/2016   Lab Results  Component Value Date   WBC 8.0 09/13/2015   HGB 15.4 09/13/2015   HCT 44.7 09/13/2015   MCV 86.5 09/13/2015   PLT 288 09/13/2015   Lab Results  Component Value Date   NA 141 10/01/2016   K 4.2 10/01/2016   CO2 28 10/01/2016   GLUCOSE 99 10/01/2016   BUN 22 10/01/2016   CREATININE 1.01 10/01/2016   BILITOT 0.8 10/01/2016   ALKPHOS 65 10/01/2016   AST 16 10/01/2016   ALT 19 10/01/2016   PROT 6.2 10/01/2016   ALBUMIN 3.9 10/01/2016   CALCIUM 9.3 10/01/2016   Lab Results  Component Value Date   CHOL 148 10/01/2016   Lab Results  Component Value Date   HDL 51 10/01/2016   Lab Results  Component Value Date   LDLCALC 75 10/01/2016   Lab Results  Component Value Date   TRIG 109 10/01/2016   Lab Results  Component Value Date   CHOLHDL 2.9 10/01/2016   Lab Results  Component Value Date   HGBA1C 5.3 10/26/2013      Assessment & Plan:   Problem List Items Addressed This Visit      Endocrine   Hypogonadism in male    Overall doing well with  medication.  We will send over a new prescription and refill to the pharmacy.  Plan to follow-up in 6 months.  He says over on Monday to get his labs just reminded him the importance of getting that done every 6 months to monitor for elevated hemoglobin levels, increase in PSA etc.  He is also due for CMP so we put that on the labs as well.         Meds ordered this encounter  Medications  . Testosterone 20.25 MG/ACT (1.62%) GEL    Sig: APPLY 1 PUMP to 1 Upper arm AND 2 PUMPS to Opposite upper arm EVERY MORNING. TOTAL OF 3 PUMPS DAILY    Dispense:  150 g    Refill:  3    Follow-up: No follow-ups on file.     I discussed the assessment and treatment plan with the patient. The patient was provided an opportunity to ask questions and all were answered. The patient agreed with the plan and demonstrated an understanding of the instructions.   The patient was advised to call back or seek an in-person evaluation if the symptoms worsen or if the condition fails to improve as anticipated.    [name], MD

## 2019-06-23 NOTE — Assessment & Plan Note (Signed)
Overall doing well with medication.  We will send over a new prescription and refill to the pharmacy.  Plan to follow-up in 6 months.  He says over on Monday to get his labs just reminded him the importance of getting that done every 6 months to monitor for elevated hemoglobin levels, increase in PSA etc.  He is also due for CMP so we put that on the labs as well.

## 2019-06-23 NOTE — Telephone Encounter (Signed)
Patient scheduled.

## 2019-06-23 NOTE — Telephone Encounter (Signed)
He still has not made an appointment.  I can always do a virtual with him this afternoon if he would like.

## 2019-06-30 DIAGNOSIS — R030 Elevated blood-pressure reading, without diagnosis of hypertension: Secondary | ICD-10-CM | POA: Diagnosis not present

## 2019-06-30 DIAGNOSIS — E291 Testicular hypofunction: Secondary | ICD-10-CM | POA: Diagnosis not present

## 2019-06-30 DIAGNOSIS — E7800 Pure hypercholesterolemia, unspecified: Secondary | ICD-10-CM | POA: Diagnosis not present

## 2019-06-30 DIAGNOSIS — Z125 Encounter for screening for malignant neoplasm of prostate: Secondary | ICD-10-CM | POA: Diagnosis not present

## 2019-07-01 ENCOUNTER — Encounter: Payer: Self-pay | Admitting: Family Medicine

## 2019-07-01 LAB — COMPLETE METABOLIC PANEL WITH GFR
AG Ratio: 2.1 (calc) (ref 1.0–2.5)
ALT: 31 U/L (ref 9–46)
AST: 27 U/L (ref 10–35)
Albumin: 4.5 g/dL (ref 3.6–5.1)
Alkaline phosphatase (APISO): 68 U/L (ref 35–144)
BUN: 17 mg/dL (ref 7–25)
CO2: 26 mmol/L (ref 20–32)
Calcium: 9.7 mg/dL (ref 8.6–10.3)
Chloride: 107 mmol/L (ref 98–110)
Creat: 1 mg/dL (ref 0.70–1.33)
GFR, Est African American: 98 mL/min/{1.73_m2} (ref >=60)
GFR, Est Non African American: 85 mL/min/{1.73_m2} (ref >=60)
Globulin: 2.1 g/dL (calc) (ref 1.9–3.7)
Glucose, Bld: 101 mg/dL — ABNORMAL HIGH (ref 65–99)
Potassium: 5.2 mmol/L (ref 3.5–5.3)
Sodium: 142 mmol/L (ref 135–146)
Total Bilirubin: 1.2 mg/dL (ref 0.2–1.2)
Total Protein: 6.6 g/dL (ref 6.1–8.1)

## 2019-07-01 LAB — LIPID PANEL W/REFLEX DIRECT LDL
Cholesterol: 175 mg/dL (ref <200)
HDL: 51 mg/dL (ref >=40)
LDL Cholesterol (Calc): 106 mg/dL (calc) — ABNORMAL HIGH
Non-HDL Cholesterol (Calc): 124 mg/dL (calc) (ref <130)
Total CHOL/HDL Ratio: 3.4 (calc) (ref <5.0)
Triglycerides: 89 mg/dL (ref <150)

## 2019-07-01 LAB — TESTOSTERONE TOTAL,FREE,BIO, MALES
Albumin: 4.5 g/dL (ref 3.6–5.1)
Sex Hormone Binding: 35 nmol/L (ref 10–50)
Testosterone, Bioavailable: 72.7 ng/dL — ABNORMAL LOW (ref >=110.0)
Testosterone, Free: 35.3 pg/mL — ABNORMAL LOW (ref 46.0–224.0)
Testosterone: 293 ng/dL (ref 250–827)

## 2019-07-01 LAB — PSA: PSA: 0.7 ng/mL (ref <=4.0)

## 2019-07-02 ENCOUNTER — Other Ambulatory Visit: Payer: Self-pay | Admitting: Family Medicine

## 2019-07-02 MED ORDER — TESTOSTERONE 20.25 MG/ACT (1.62%) TD GEL: TRANSDERMAL | 3 refills | Status: DC

## 2019-08-19 ENCOUNTER — Encounter: Payer: Self-pay | Admitting: Family Medicine

## 2019-08-19 DIAGNOSIS — E291 Testicular hypofunction: Secondary | ICD-10-CM

## 2019-08-19 NOTE — Telephone Encounter (Signed)
Lab pended, any specific instructions?

## 2019-08-20 DIAGNOSIS — E291 Testicular hypofunction: Secondary | ICD-10-CM | POA: Diagnosis not present

## 2019-08-20 NOTE — Telephone Encounter (Signed)
No need to fast

## 2019-08-25 LAB — CBC
HCT: 46.8 % (ref 38.5–50.0)
Hemoglobin: 16.3 g/dL (ref 13.2–17.1)
MCH: 30.2 pg (ref 27.0–33.0)
MCHC: 34.8 g/dL (ref 32.0–36.0)
MCV: 86.7 fL (ref 80.0–100.0)
MPV: 10.1 fL (ref 7.5–12.5)
Platelets: 278 10*3/uL (ref 140–400)
RBC: 5.4 10*6/uL (ref 4.20–5.80)
RDW: 12.7 % (ref 11.0–15.0)
WBC: 6.2 10*3/uL (ref 3.8–10.8)

## 2019-08-25 LAB — TESTOSTERONE TOTAL,FREE,BIO, MALES
Albumin: 4.1 g/dL (ref 3.6–5.1)
Sex Hormone Binding: 29 nmol/L (ref 10–50)
Testosterone, Bioavailable: 117.5 ng/dL (ref >=110.0)
Testosterone, Free: 62.4 pg/mL (ref 46.0–224.0)
Testosterone: 415 ng/dL (ref 250–827)

## 2019-09-08 ENCOUNTER — Encounter: Payer: Self-pay | Admitting: Family Medicine

## 2019-09-13 ENCOUNTER — Other Ambulatory Visit: Payer: Self-pay | Admitting: *Deleted

## 2019-09-13 ENCOUNTER — Other Ambulatory Visit: Payer: Self-pay | Admitting: Family Medicine

## 2019-09-13 NOTE — Telephone Encounter (Signed)
Call pharmacy and see if we need to adjust the [place] on the prescription.  I'm not sure why they're calling for a refill when it looks like based on our refill that he is not due until April.  Does it need to be increased to 172 g?

## 2019-09-15 NOTE — Telephone Encounter (Signed)
Called pharmacy and spoke w/Aaron and he stated that the Sig and dispense amount is correct and that he hasn't p/u from their store since 05/2019. He also informed me that this actually requires a PA. Will fwd to pcp as an FYI.Loralee Pacas Kimball

## 2019-09-15 NOTE — Telephone Encounter (Signed)
Please initiate prior authorization, per pharmacy request.

## 2019-09-20 ENCOUNTER — Telehealth: Payer: Self-pay | Admitting: Family Medicine

## 2019-09-20 NOTE — Telephone Encounter (Signed)
Received fax for PA Testosterone sent through cover my meds waiting on determination. - CF

## 2019-09-22 NOTE — Telephone Encounter (Signed)
Received fax from [facility] they approved Testosterone.   Reference # [id number] Valid: 09/19/2019 - 07/28/2038

## 2019-10-03 ENCOUNTER — Ambulatory Visit: Payer: BC Managed Care – PPO | Attending: Internal Medicine

## 2019-10-03 DIAGNOSIS — Z23 Encounter for immunization: Secondary | ICD-10-CM | POA: Insufficient documentation

## 2019-10-03 NOTE — Progress Notes (Signed)
   Covid-19 Vaccination Clinic  Name:  Dustin Kennedy    MRN: [id number] DOB: November 07, 1965  10/03/2019  [name] was observed post Covid-19 immunization for 15 minutes without incident. He was provided with Vaccine Information Sheet and instruction to access the V-Safe system.   [name] was instructed to call 911 with any severe reactions post vaccine: Marland Kitchen Difficulty breathing  . Swelling of face and throat  . A fast heartbeat  . A bad rash all over body  . Dizziness and weakness   Immunizations Administered    Name Date Dose VIS Date Route   Pfizer COVID-19 Vaccine 10/03/2019  8:14 AM 0.3 mL 07/09/2019 Intramuscular   Manufacturer: [employer], [employer]   Lot: [id number]   NDC: [address]-9

## 2019-10-24 ENCOUNTER — Ambulatory Visit: Payer: Self-pay | Attending: Internal Medicine

## 2019-10-24 DIAGNOSIS — Z23 Encounter for immunization: Secondary | ICD-10-CM

## 2019-10-24 NOTE — Progress Notes (Signed)
   Covid-19 Vaccination Clinic  Name:  Dustin Kennedy    MRN: [id number] DOB: 1966-03-05  10/24/2019  Dustin Kennedy was observed post Covid-19 immunization for 15 minutes without incident. He was provided with Vaccine Information Sheet and instruction to access the V-Safe system.   Dustin Kennedy was instructed to call 911 with any severe reactions post vaccine: Dustin Kitchen Difficulty breathing  . Swelling of face and throat  . A fast heartbeat  . A bad rash all over body  . Dizziness and weakness   Immunizations Administered    Name Date Dose VIS Date Route   Pfizer COVID-19 Vaccine 10/24/2019  8:18 AM 0.3 mL 07/09/2019 Intramuscular   Manufacturer: [employer], [employer]   Lot: [id number]   NDC: [address]-6

## 2020-02-13 ENCOUNTER — Other Ambulatory Visit: Payer: Self-pay | Admitting: Family Medicine

## 2020-02-16 ENCOUNTER — Encounter: Payer: Self-pay | Admitting: Family Medicine

## 2020-02-16 ENCOUNTER — Other Ambulatory Visit: Payer: Self-pay

## 2020-02-16 ENCOUNTER — Ambulatory Visit (INDEPENDENT_AMBULATORY_CARE_PROVIDER_SITE_OTHER): Payer: BC Managed Care – PPO | Admitting: Family Medicine

## 2020-02-16 VITALS — BP 134/82 | HR 64 | Ht 70.0 in | Wt 262.0 lb

## 2020-02-16 DIAGNOSIS — R635 Abnormal weight gain: Secondary | ICD-10-CM

## 2020-02-16 DIAGNOSIS — R0683 Snoring: Secondary | ICD-10-CM

## 2020-02-16 DIAGNOSIS — G8929 Other chronic pain: Secondary | ICD-10-CM

## 2020-02-16 DIAGNOSIS — E291 Testicular hypofunction: Secondary | ICD-10-CM

## 2020-02-16 DIAGNOSIS — R413 Other amnesia: Secondary | ICD-10-CM | POA: Diagnosis not present

## 2020-02-16 DIAGNOSIS — G4733 Obstructive sleep apnea (adult) (pediatric): Secondary | ICD-10-CM

## 2020-02-16 DIAGNOSIS — R519 Headache, unspecified: Secondary | ICD-10-CM

## 2020-02-16 MED ORDER — TOPIRAMATE 25 MG PO TABS: ORAL_TABLET | ORAL | 0 refills | Status: DC

## 2020-02-16 MED ORDER — TESTOSTERONE 20.25 MG/ACT (1.62%) TD GEL: TRANSDERMAL | 3 refills | Status: DC

## 2020-02-16 NOTE — Assessment & Plan Note (Signed)
We will get updated sleep study he had mild apnea back in 2018 but I am concerned it may have actually progressed or worsened.  He has gained some weight recently which certainly could be contributing but could also make it harder for him to lose weight.

## 2020-02-16 NOTE — Progress Notes (Signed)
He reports that when he attempted to refill the Testosterone he was told that there were no refills left.   I contacted the pharmacy and was informed that due to this being a controlled substance it expires in 6 months.

## 2020-02-16 NOTE — Assessment & Plan Note (Signed)
Due for updated labs.  We will go ahead and send refill to the pharmacy.  He will have to be seen every 6 months because this is a scheduled drug.

## 2020-02-16 NOTE — Progress Notes (Signed)
Established Patient Office Visit  Subjective:  Patient ID: Dustin Kennedy, male    DOB: 04-14-1966  Age: 54 y.o. MRN: [id number]  CC:  Chief Complaint  Patient presents with  . Hypogonadism    HPI Dustin Kennedy presents for aloe up of hypogonadism.  He is doing well with his testosterone.  That he was not able to get his refill recently.  He is due for blood work.  In fact he was due in June.  Has been having frequent headaches again.  He has some chronic pain on the right side of his neck.  He is also been experiencing some allergy symptoms recently so is also been taking Mucinex the headaches have been almost daily.  He does take Aleve daily in the mornings but more for his neck not so much for his headaches.  He says with some of the headaches he has had to leave work and go home and sit in a dark room.  He is also had pretty significant nausea at times but is never vomited.  He denies any sound sensitivity.  He is also frustrated that he has been gaining weight.  He lost 30 pounds when he was on hCG injections.  He is also having difficulty with his memory such as recalling names and roads etc.  Some will ask him a question and he cannot quite bring the words forward.  He does have a diagnosis of mild sleep apnea.  He says he has felt a little down recently in part just because of body image he feels like how he looks physically directly impacts how he feels about himself and his ego.  Again he is frustrated with weight gain.    Past Medical History:  Diagnosis Date  . Hyperlipidemia   . Hypertension     Past Surgical History:  Procedure Laterality Date  . lipomas removal     removal X 4  . LT shoulder surgery  1997   for bone spurs    Family History  Problem Relation Age of Onset  . Hypertension Unknown        family history  . Hyperlipidemia Unknown        family history  . Thyroid disease Unknown        family history  . Stroke Unknown        family history     Social History   Socioeconomic History  . Marital status: Married    Spouse name: [name]  . Number of children: 2  . Years of education: College  . Highest education level: Not on file  Occupational History  . Occupation: [employer]  Tobacco Use  . Smoking status: Never Smoker  . Smokeless tobacco: Never Used  Substance and Sexual Activity  . Alcohol use: Yes    Comment: 1-4 drinks per month or less  . Drug use: No  . 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  Other Topics Concern  . Not on file  Social History Narrative   Lives w/ wife   Caffeine use: 2-3 cups coffee per day   Social Determinants of Health   Financial Resource Strain:   . Difficulty of Paying Living Expenses:   Food Insecurity:   . Worried About Programme researcher, broadcasting/film/video in the Last Year:   . Barista in  the Last Year:   Transportation Needs:   . Freight forwarder (Medical):   Marland Kitchen Lack of Transportation (Non-Medical):   Physical Activity:   . Days of Exercise per Week:   . Minutes of Exercise per Session:   Stress:   . Feeling of Stress :   Social Connections:   . Frequency of Communication with Friends and Family:   . Frequency of Social Gatherings with Friends and Family:   . Attends Religious Services:   . Active Member of Clubs or Organizations:   . Attends Banker Meetings:   Marland Kitchen Marital Status:   Intimate Partner Violence:   . Fear of Current or Ex-Partner:   . Emotionally Abused:   Marland Kitchen Physically Abused:   . Sexually Abused:     Outpatient Medications Prior to Visit  Medication Sig Dispense Refill  . Multiple Vitamins-Minerals (MULTIVITAMIN ADULT PO) Take by mouth.    . Omega-3 Fatty Acids (FISH OIL) 1000 MG CAPS Take by mouth.    . Testosterone 20.25 MG/ACT (1.62%) GEL APPLY 2 PUMP to 1 Upper arm AND 2 PUMPS to Opposite upper  arm EVERY MORNING. TOTAL OF 4 PUMPS DAILY 150 g 3   No facility-administered medications prior to visit.    Allergies  Allergen Reactions  . Azucena Freed [Testosterone] Rash    Skin irritation    ROS Review of Systems    Objective:    Physical Exam Constitutional:      Appearance: He is well-developed.  HENT:     Head: Normocephalic and atraumatic.  Cardiovascular:     Rate and Rhythm: Normal rate and regular rhythm.     Heart sounds: Normal heart sounds.  Pulmonary:     Effort: Pulmonary effort is normal.     Breath sounds: Normal breath sounds.  Skin:    General: Skin is warm and dry.  Neurological:     Mental Status: He is alert and oriented to person, place, and time.  Psychiatric:        Behavior: Behavior normal.     BP 134/82   Pulse 64   Ht 5\' 10"  (1.778 m)   Wt 262 lb (118.8 kg)   SpO2 98%   BMI 37.59 kg/m  Wt Readings from Last 3 Encounters:  02/16/20 262 lb (118.8 kg)  06/23/19 250 lb (113.4 kg)  08/10/18 250 lb (113.4 kg)     There are no preventive care reminders to display for this patient.  There are no preventive care reminders to display for this patient.  Lab Results  Component Value Date   TSH 1.44 10/01/2016   Lab Results  Component Value Date   WBC 6.2 08/20/2019   HGB 16.3 08/20/2019   HCT 46.8 08/20/2019   MCV 86.7 08/20/2019   PLT 278 08/20/2019   Lab Results  Component Value Date   NA 142 06/30/2019   K 5.2 06/30/2019   CO2 26 06/30/2019   GLUCOSE 101 (H) 06/30/2019   BUN 17 06/30/2019   CREATININE 1.00 06/30/2019   BILITOT 1.2 06/30/2019   ALKPHOS 65 10/01/2016   AST 27 06/30/2019   ALT 31 06/30/2019   PROT 6.6 06/30/2019   ALBUMIN 3.9 10/01/2016   CALCIUM 9.7 06/30/2019   Lab Results  Component Value Date   CHOL 175 06/30/2019   Lab Results  Component Value Date   HDL 51 06/30/2019   Lab Results  Component Value Date   LDLCALC 106 (H) 06/30/2019   Lab Results  Component  Value Date   TRIG 89  06/30/2019   Lab Results  Component Value Date   CHOLHDL 3.4 06/30/2019   Lab Results  Component Value Date   HGBA1C 5.3 10/26/2013      Assessment & Plan:   Problem List Items Addressed This Visit      Respiratory   OSA (obstructive sleep apnea)    We will get updated sleep study he had mild apnea back in 2018 but I am concerned it may have actually progressed or worsened.  He has gained some weight recently which certainly could be contributing but could also make it harder for him to lose weight.        Endocrine   Hypogonadism in male - Primary    Due for updated labs.  We will go ahead and send refill to the pharmacy.  He will have to be seen every 6 months because this is a scheduled drug.      Relevant Orders   Testosterone Total,Free,Bio, Males   CBC   PSA     Other   WEIGHT GAIN    Weight gain with BMI of 37.  We discussed options including referral to a nutritionist we could consider a more comprehensive bariatric program.  He says he might consider going back and doing hCG injections again.  We discussed really increasing vegetables and decreasing carb intake and maybe even considering intermittent fasting.  We also discussed doing meal planning on the weekends.      Memory difficulties    We discussed some of his struggles with his memory recently.  Will evaluate for underlying causes.  I do think some mild depressive symptoms could be contributing but I do not think he has significant or severe depression.  Also discussed that untreated sleep apnea could be contributing so will try to get an updated sleep study as well.  If everything looks reassuring then consider further work-up and possible imaging.      Relevant Orders   COMPLETE METABOLIC PANEL WITH GFR   CBC   TSH    Other Visit Diagnoses    Chronic nonintractable headache, unspecified headache type       Relevant Medications   topiramate (TOPAMAX) 25 MG tablet   Other Relevant Orders   COMPLETE  METABOLIC PANEL WITH GFR   CBC   TSH   Snoring       Relevant Orders   Home sleep test      Meds ordered this encounter  Medications  . topiramate (TOPAMAX) 25 MG tablet    Sig: 1 tab po QHS x 1 week, the increase to BID x 1 week. The increase to 2 at bedtime and 1 in AM x 1 week then 2 tab PO BID.    Dispense:  120 tablet    Refill:  0  . Testosterone 20.25 MG/ACT (1.62%) GEL    Sig: APPLY 2 PUMP to 1 Upper arm AND 2 PUMPS to Opposite upper arm EVERY MORNING. TOTAL OF 4 PUMPS DAILY    Dispense:  150 g    Refill:  3    Follow-up: Return in about 8 weeks (around 04/12/2020) for Headaches.    Nani Gasser, MD

## 2020-02-16 NOTE — Assessment & Plan Note (Signed)
We discussed some of his struggles with his memory recently.  Will evaluate for underlying causes.  I do think some mild depressive symptoms could be contributing but I do not think he has significant or severe depression.  Also discussed that untreated sleep apnea could be contributing so will try to get an updated sleep study as well.  If everything looks reassuring then consider further work-up and possible imaging.

## 2020-02-16 NOTE — Assessment & Plan Note (Signed)
Weight gain with BMI of 37.  We discussed options including referral to a nutritionist we could consider a more comprehensive bariatric program.  He says he might consider going back and doing hCG injections again.  We discussed really increasing vegetables and decreasing carb intake and maybe even considering intermittent fasting.  We also discussed doing meal planning on the weekends.

## 2020-02-17 ENCOUNTER — Other Ambulatory Visit: Payer: Self-pay | Admitting: *Deleted

## 2020-02-17 LAB — TESTOSTERONE TOTAL,FREE,BIO, MALES
Albumin: 4.3 g/dL (ref 3.6–5.1)
Sex Hormone Binding: 28 nmol/L (ref 10–50)
Testosterone, Bioavailable: 246.8 ng/dL (ref >=110.0)
Testosterone, Free: 125.3 pg/mL (ref 46.0–224.0)
Testosterone: 736 ng/dL (ref 250–827)

## 2020-02-17 LAB — TSH: TSH: 1.6 mIU/L (ref 0.40–4.50)

## 2020-02-17 LAB — COMPLETE METABOLIC PANEL WITH GFR
AG Ratio: 2 (calc) (ref 1.0–2.5)
ALT: 22 U/L (ref 9–46)
AST: 20 U/L (ref 10–35)
Albumin: 4.3 g/dL (ref 3.6–5.1)
Alkaline phosphatase (APISO): 65 U/L (ref 35–144)
BUN: 18 mg/dL (ref 7–25)
CO2: 27 mmol/L (ref 20–32)
Calcium: 9.4 mg/dL (ref 8.6–10.3)
Chloride: 106 mmol/L (ref 98–110)
Creat: 1.05 mg/dL (ref 0.70–1.33)
GFR, Est African American: 92 mL/min/{1.73_m2} (ref >=60)
GFR, Est Non African American: 80 mL/min/{1.73_m2} (ref >=60)
Globulin: 2.1 g/dL (calc) (ref 1.9–3.7)
Glucose, Bld: 97 mg/dL (ref 65–99)
Potassium: 4.7 mmol/L (ref 3.5–5.3)
Sodium: 141 mmol/L (ref 135–146)
Total Bilirubin: 1.1 mg/dL (ref 0.2–1.2)
Total Protein: 6.4 g/dL (ref 6.1–8.1)

## 2020-02-17 LAB — CBC
HCT: 47.7 % (ref 38.5–50.0)
Hemoglobin: 16.7 g/dL (ref 13.2–17.1)
MCH: 30.6 pg (ref 27.0–33.0)
MCHC: 35 g/dL (ref 32.0–36.0)
MCV: 87.5 fL (ref 80.0–100.0)
MPV: 9.7 fL (ref 7.5–12.5)
Platelets: 258 10*3/uL (ref 140–400)
RBC: 5.45 10*6/uL (ref 4.20–5.80)
RDW: 12.4 % (ref 11.0–15.0)
WBC: 5.8 10*3/uL (ref 3.8–10.8)

## 2020-02-17 LAB — PSA: PSA: 0.8 ng/mL (ref <=4.0)

## 2020-02-18 ENCOUNTER — Encounter: Payer: Self-pay | Admitting: Family Medicine

## 2020-02-18 DIAGNOSIS — R519 Headache, unspecified: Secondary | ICD-10-CM

## 2020-02-18 DIAGNOSIS — R413 Other amnesia: Secondary | ICD-10-CM

## 2020-02-18 DIAGNOSIS — G8929 Other chronic pain: Secondary | ICD-10-CM

## 2020-02-18 DIAGNOSIS — M542 Cervicalgia: Secondary | ICD-10-CM

## 2020-02-22 NOTE — Addendum Note (Signed)
Addended by: Nani Gasser D on: 02/22/2020 05:04 PM   Modules accepted: Orders

## 2020-02-25 ENCOUNTER — Ambulatory Visit (INDEPENDENT_AMBULATORY_CARE_PROVIDER_SITE_OTHER): Payer: BC Managed Care – PPO

## 2020-02-25 ENCOUNTER — Other Ambulatory Visit: Payer: Self-pay

## 2020-02-25 DIAGNOSIS — M542 Cervicalgia: Secondary | ICD-10-CM | POA: Diagnosis not present

## 2020-02-25 DIAGNOSIS — R519 Headache, unspecified: Secondary | ICD-10-CM

## 2020-02-25 DIAGNOSIS — G8929 Other chronic pain: Secondary | ICD-10-CM | POA: Diagnosis not present

## 2020-02-29 ENCOUNTER — Ambulatory Visit (INDEPENDENT_AMBULATORY_CARE_PROVIDER_SITE_OTHER): Payer: BC Managed Care – PPO

## 2020-02-29 ENCOUNTER — Other Ambulatory Visit: Payer: Self-pay | Admitting: Family Medicine

## 2020-02-29 ENCOUNTER — Other Ambulatory Visit: Payer: Self-pay

## 2020-02-29 DIAGNOSIS — M2548 Effusion, other site: Secondary | ICD-10-CM | POA: Diagnosis not present

## 2020-02-29 DIAGNOSIS — R519 Headache, unspecified: Secondary | ICD-10-CM | POA: Diagnosis not present

## 2020-02-29 DIAGNOSIS — G8929 Other chronic pain: Secondary | ICD-10-CM

## 2020-02-29 DIAGNOSIS — R413 Other amnesia: Secondary | ICD-10-CM

## 2020-02-29 DIAGNOSIS — R9082 White matter disease, unspecified: Secondary | ICD-10-CM | POA: Diagnosis not present

## 2020-02-29 DIAGNOSIS — M4802 Spinal stenosis, cervical region: Secondary | ICD-10-CM | POA: Insufficient documentation

## 2020-02-29 MED ORDER — GADOBUTROL 1 MMOL/ML IV SOLN
10.0000 mL | Freq: Once | INTRAVENOUS | Status: AC | PRN
  Administered 2020-02-29: 10 mL via INTRAVENOUS

## 2020-02-29 NOTE — Progress Notes (Signed)
Order placed for PT 

## 2020-03-02 ENCOUNTER — Encounter: Payer: Self-pay | Admitting: Family Medicine

## 2020-03-02 ENCOUNTER — Other Ambulatory Visit: Payer: Self-pay | Admitting: Family Medicine

## 2020-03-02 MED ORDER — ELETRIPTAN HYDROBROMIDE 20 MG PO TABS
20.0000 mg | ORAL_TABLET | ORAL | 5 refills | Status: DC | PRN
Start: 2020-03-02 — End: 2021-12-13

## 2020-03-07 MED ORDER — AMOXICILLIN-POT CLAVULANATE 875-125 MG PO TABS: 1.0000 | ORAL_TABLET | Freq: Two times a day (BID) | ORAL | 0 refills | Status: DC

## 2020-03-07 NOTE — Telephone Encounter (Signed)
Routing to covering provider.  °

## 2020-03-14 ENCOUNTER — Ambulatory Visit (INDEPENDENT_AMBULATORY_CARE_PROVIDER_SITE_OTHER): Payer: BC Managed Care – PPO | Admitting: Rehabilitative and Restorative Service Providers"

## 2020-03-14 ENCOUNTER — Other Ambulatory Visit: Payer: Self-pay

## 2020-03-14 DIAGNOSIS — R29898 Other symptoms and signs involving the musculoskeletal system: Secondary | ICD-10-CM | POA: Diagnosis not present

## 2020-03-14 DIAGNOSIS — M542 Cervicalgia: Secondary | ICD-10-CM

## 2020-03-14 DIAGNOSIS — R293 Abnormal posture: Secondary | ICD-10-CM | POA: Diagnosis not present

## 2020-03-14 NOTE — Therapy (Signed)
[facility] [place] [address] [place] [address] [place], [place], [address] Phone: [phone]   Fax:  [phone]  Physical Therapy Evaluation  Patient Details  Name: Dustin Kennedy MRN: [id number] Date of Birth: [birth year] Referring Provider (PT): Nani Gasser, MD   Encounter Date: 03/14/2020   PT End of Session - 03/14/20 0850    Visit Number 1    Number of Visits 12    Date for PT Re-Evaluation 04/25/20    Authorization Type BCBS    Authorization - Number of Visits 30    PT Start Time 0800    PT Stop Time 0845    PT Time Calculation (min) 45 min    Activity Tolerance Patient tolerated treatment well    Behavior During Therapy [facility] for tasks assessed/performed           Past Medical History:  Diagnosis Date  . Hyperlipidemia   . Hypertension     Past Surgical History:  Procedure Laterality Date  . lipomas removal     removal X 4  . LT shoulder surgery  1997   for bone spurs    There were no vitals filed for this visit.    Subjective Assessment - 03/14/20 0800    Subjective The patient reports he is having R sided neck pain that is worsening over the last 2-3 months.  He uses a TENS unit and biofreeze daily.    Diagnostic tests C3-C5 facet degeneration    Patient Stated Goals Reducing pain in his neck (responded well to prior PT)    Currently in Pain? Yes    Pain Score 7     Pain Location Neck    Pain Orientation Right    Pain Descriptors / Indicators Aching;Sore;Sharp    Pain Type Acute pain    Pain Onset More than a month ago    Pain Frequency Intermittent    Aggravating Factors  worse with looking at computer monitors, turning head to the right    Pain Relieving Factors head stationery              [facility] PT Assessment - 03/14/20 0809      Assessment   Medical Diagnosis chronic HA, chronic neck pain, forarminal stenosis    Referring Provider (PT) Nani Gasser, MD    Onset Date/Surgical Date  02/29/20    Hand Dominance Right    Prior Therapy known to our clinic from PT in 2018      Precautions   Precautions None      Balance Screen   Has the patient fallen in the past 6 months No    Has the patient had a decrease in activity level because of a fear of falling?  No    Is the patient reluctant to leave their home because of a fear of falling?  No      Home [employer] information [employer] residence    Living Arrangements Spouse/significant other      Prior Function   Level of Independence Independent      Observation/Other Assessments   Focus on Therapeutic Outcomes (FOTO)  41% limitation      Sensation   Light Touch Appears Intact      Posture/Postural Control   Posture/Postural Control Postural limitations    Posture Comments Noted mild L eye ptosis -- patient notes h/o lazy eye, not acute symptom      ROM / Strength   AROM / PROM / Strength AROM;Strength  AROM   Overall AROM  Deficits    AROM Assessment Site Cervical    Cervical Flexion 40    Cervical Extension 40    Cervical - Right Side Bend 32    Cervical - Left Side Bend 22   painful R c-spine   Cervical - Right Rotation 52   painful   Cervical - Left Rotation 65      Strength   Overall Strength Within functional limits for tasks performed      Palpation   Spinal mobility C-spine posture is anterior; pain and hypomobility at CTJ with central PA mobs and lateral mobs    Palpation comment Significant Muscle TrP in R upper trap, R levator, R scalenes      Special Tests    Special Tests Cervical    Cervical Tests Dictraction      Distraction Test   Findngs Negative    Comment no change with distraction                      Objective measurements completed on examination: See above findings.       [facility] Adult PT Treatment/Exercise - 03/14/20 0809      Exercises   Exercises Neck      Neck Exercises: Standing   Other Standing Exercises Scapular retraction "w" x 10  eps      Neck Exercises: Seated   Lateral Flexion Right;Left   hold 20 seconds for stretch   Shoulder Rolls Backwards;10 reps    Other Seated Exercise levator stretch            Trigger Point Dry Needling - 03/14/20 0823    Consent Given? Yes    Education Handout Provided Yes    Muscles Treated Head and Neck Upper trapezius    Dry Needling Comments R cervical rotation ROM to 65 degrees post needling    Upper Trapezius Response Twitch reponse elicited;Palpable increased muscle length                PT Education - 03/14/20 0850    Education Details HEP initiated    Person(s) Educated Patient    Methods Explanation;Demonstration;Handout    Comprehension Returned demonstration;Verbalized understanding               PT Long Term Goals - 03/14/20 0859      PT LONG TERM GOAL #1   Title The patient will be indep with HEP.    Time 6    Period Weeks    Target Date 04/25/20      PT LONG TERM GOAL #2   Title The patient will report reduction in functional limitations improving FOTO to < or equal to 33% limitation.    Time 6    Period Weeks    Target Date 04/25/20      PT LONG TERM GOAL #3   Title The patient will improve cervical rotation to > or equal to 70 degrees bilaterally.    Time 6    Period Weeks    Target Date 04/25/20      PT LONG TERM GOAL #4   Title The patient will report reduced frequency of HA to < or equal to 3 days/week.    Baseline from daily    Time 6    Period Weeks    Target Date 04/25/20      PT LONG TERM GOAL #5   Title The patient will report resting neck pain < or equal  to 2/10.    Baseline 7/10    Time 6    Period Weeks    Target Date 04/25/20                  Plan - 03/14/20 0851    Clinical Impression Statement The patient is a 54 yo male with h/o chronic neck pain with recent exacerbation of symptoms over the past 6-7 weeks.  He presents with impairments of decreased AROM rotation R, sidebending bilaterally, postural  changes with forward head position, hypomobility through cervico-thoracic junction, soft tissue tightness in upper trapezius/scalenes/levator, trigger points, and pain limiting function.  PT to address deficits to return to prior functional status.    Personal Factors and Comorbidities Comorbidity 1    Comorbidities migraine    Examination-Activity Limitations Lift    Examination-Participation Restrictions Occupation;Driving    Stability/Clinical Decision Making Stable/Uncomplicated    Clinical Decision Making Low    Rehab Potential Good    PT Frequency 2x / week    PT Duration 6 weeks    PT Treatment/Interventions ADLs/Self Care Home Management;Therapeutic activities;Therapeutic exercise;Electrical Stimulation;Manual techniques;Dry needling;Patient/family education;Traction;Moist Heat;Taping;Passive range of motion    PT Next Visit Plan c-spine stabilization and posterior shoulder girdle strengthening, flexibility, DN and modalities as needed, STM/ trigger point release    PT Home Exercise Plan Access Code: [email or web address]    Consulted and Agree with Plan of Care Patient           Patient will benefit from skilled therapeutic intervention in order to improve the following deficits and impairments:  Increased fascial restricitons, Pain, Hypomobility, Postural dysfunction, Impaired flexibility  Visit Diagnosis: Cervicalgia  Abnormal posture  Other symptoms and signs involving the musculoskeletal system     Problem List Patient Active Problem List   Diagnosis Date Noted  . Foraminal stenosis of cervical region 02/29/2020  . OSA (obstructive sleep apnea) 02/16/2020  . Memory difficulties 02/16/2020  . Sebaceous cyst back of neck 06/23/2017  . Osteoarthritis of left acromioclavicular joint 08/12/2012  . Obesity (BMI 30-39.9) 07/31/2012  . Rotator cuff syndrome of left shoulder 07/31/2012  . Venous insufficiency (chronic) (peripheral) 04/20/2012  . Hypogonadism in male 02/05/2010  .  WEIGHT GAIN 10/20/2009    Crislyn Willbanks, PT 03/14/2020, 9:02 AM  [facility] [address] [place] [address] [place], [place], [address] Phone: [phone]   Fax:  [phone]  Name: BRYSUN ESCHMANN MRN: [id number] Date of Birth: 09/19/1965

## 2020-03-14 NOTE — Patient Instructions (Signed)
Access Code: [email or web address] URL: [email or web address] Date: 03/14/2020 Prepared by: Margretta Ditty  Exercises Seated Gentle Upper Trapezius Stretch - 2 x daily - 7 x weekly - 1 sets - 3 reps - 20 seconds hold Gentle Levator Scapulae Stretch - 2 x daily - 7 x weekly - 1 sets - 3 reps - 20 seconds hold Seated Shoulder Circles - 2 x daily - 7 x weekly - 1 sets - 10 reps Standing Scapular Retraction in Abduction - 2 x daily - 7 x weekly - 1 sets - 10-15 reps  Patient Education Trigger Point Dry Needling

## 2020-03-15 ENCOUNTER — Other Ambulatory Visit: Payer: Self-pay | Admitting: Family Medicine

## 2020-03-15 ENCOUNTER — Other Ambulatory Visit: Payer: Self-pay | Admitting: *Deleted

## 2020-03-15 DIAGNOSIS — G8929 Other chronic pain: Secondary | ICD-10-CM

## 2020-03-15 DIAGNOSIS — R519 Headache, unspecified: Secondary | ICD-10-CM

## 2020-03-15 MED ORDER — TOPIRAMATE 50 MG PO TABS
50.0000 mg | ORAL_TABLET | Freq: Two times a day (BID) | ORAL | 1 refills | Status: DC
Start: 2020-03-15 — End: 2020-05-29

## 2020-03-20 ENCOUNTER — Encounter: Payer: Self-pay | Admitting: Family Medicine

## 2020-03-21 ENCOUNTER — Ambulatory Visit (INDEPENDENT_AMBULATORY_CARE_PROVIDER_SITE_OTHER): Payer: BC Managed Care – PPO | Admitting: Physical Therapy

## 2020-03-21 ENCOUNTER — Encounter: Payer: Self-pay | Admitting: Physical Therapy

## 2020-03-21 ENCOUNTER — Other Ambulatory Visit: Payer: Self-pay

## 2020-03-21 DIAGNOSIS — Z20828 Contact with and (suspected) exposure to other viral communicable diseases: Secondary | ICD-10-CM | POA: Diagnosis not present

## 2020-03-21 DIAGNOSIS — M542 Cervicalgia: Secondary | ICD-10-CM | POA: Diagnosis not present

## 2020-03-21 DIAGNOSIS — R293 Abnormal posture: Secondary | ICD-10-CM

## 2020-03-21 DIAGNOSIS — R29898 Other symptoms and signs involving the musculoskeletal system: Secondary | ICD-10-CM

## 2020-03-21 NOTE — Therapy (Signed)
[facility] [place] [address] [place] [address] [place], [place], [address] Phone: [phone]   Fax:  [phone]  Physical Therapy Treatment  Patient Details  Name: Dustin Kennedy MRN: [id number] Date of Birth: 03-02-1966 Referring Provider (PT): Nani Gasser, MD   Encounter Date: 03/21/2020   PT End of Session - 03/21/20 0850    Visit Number 2    Number of Visits 12    Date for PT Re-Evaluation 04/25/20    Authorization Type BCBS    PT Start Time 0850    PT Stop Time 0941    PT Time Calculation (min) 51 min    Activity Tolerance Patient tolerated treatment well    Behavior During Therapy [facility] for tasks assessed/performed           Past Medical History:  Diagnosis Date  . Hyperlipidemia   . Hypertension     Past Surgical History:  Procedure Laterality Date  . lipomas removal     removal X 4  . LT shoulder surgery  1997   for bone spurs    There were no vitals filed for this visit.   Subjective Assessment - 03/21/20 0850    Subjective Patient reporting some relief since last visit. 5/10 today. Headache 7/10.    Diagnostic tests C3-C5 facet degeneration    Patient Stated Goals Reducing pain in his neck (responded well to prior PT)    Currently in Pain? Yes    Pain Score 5     Pain Location Neck    Pain Orientation Right    Pain Descriptors / Indicators Aching;Sore                             [facility] Adult PT Treatment/Exercise - 03/21/20 0001      Neck Exercises: Machines for Strengthening   UBE (Upper Arm Bike) L3 x 4 min 58fwd/2bwd      Neck Exercises: Standing   Neck Retraction 5 reps;5 secs    Other Standing Exercises on noodle: Ws 10 sec x 5 reps, L's 5 sec hold x 10; horizontal ABD blue x 10      Neck Exercises: Prone   Other Prone Exercise on elbows neck diagonals x 10 each way      Modalities   Modalities Electrical Stimulation;Moist Heat      Moist Heat Therapy   Number Minutes Moist  Heat 10 Minutes    Moist Heat Location Cervical      Electrical Stimulation   Electrical Stimulation Location neck and right UT    Electrical Stimulation Action IFC    Electrical Stimulation Parameters to tolerance x 10 min    Electrical Stimulation Goals Pain;Tone      Manual Therapy   Manual Therapy Soft tissue mobilization;Joint mobilization    Manual therapy comments skilled palpation and monitoring of soft tissues during DN    Joint Mobilization upper thoracic and cervical CPA; UPA right cervical, upper cervical rotation mobs    Soft tissue mobilization to right Upper traps, levator, paraspinals      Neck Exercises: Stretches   Chest Stretch 4 reps;20 seconds   Doorway 90 and 120 deg           Trigger Point Dry Needling - 03/21/20 0001    Consent Given? Yes    Education Handout Provided Previously provided    Muscles Treated Head and Neck Upper trapezius;Oblique capitus;Suboccipitals;Cervical multifidi    Dry Needling Comments  Rt side; bil suboccipitals    Upper Trapezius Response Twitch reponse elicited;Palpable increased muscle length    Oblique Capitus Response Palpable increased muscle length    Suboccipitals Response Palpable increased muscle length    Cervical multifidi Response Twitch reponse elicited;Palpable increased muscle length                     PT Long Term Goals - 03/14/20 0859      PT LONG TERM GOAL #1   Title The patient will be indep with HEP.    Time 6    Period Weeks    Target Date 04/25/20      PT LONG TERM GOAL #2   Title The patient will report reduction in functional limitations improving FOTO to < or equal to 33% limitation.    Time 6    Period Weeks    Target Date 04/25/20      PT LONG TERM GOAL #3   Title The patient will improve cervical rotation to > or equal to 70 degrees bilaterally.    Time 6    Period Weeks    Target Date 04/25/20      PT LONG TERM GOAL #4   Title The patient will report reduced frequency of HA  to < or equal to 3 days/week.    Baseline from daily    Time 6    Period Weeks    Target Date 04/25/20      PT LONG TERM GOAL #5   Title The patient will report resting neck pain < or equal to 2/10.    Baseline 7/10    Time 6    Period Weeks    Target Date 04/25/20                 Plan - 03/21/20 1038    Clinical Impression Statement Patient tolerated therex well without increased pain. Great response to DN in right c-spine and upper traps today. Patient demo's improved mobility at end of treatment.    PT Treatment/Interventions ADLs/Self Care Home Management;Therapeutic activities;Therapeutic exercise;Electrical Stimulation;Manual techniques;Dry needling;Patient/family education;Traction;Moist Heat;Taping;Passive range of motion    PT Next Visit Plan c-spine stabilization and posterior shoulder girdle strengthening, flexibility, DN and modalities as needed, STM/ trigger point release    PT Home Exercise Plan Access Code: [email or web address]           Patient will benefit from skilled therapeutic intervention in order to improve the following deficits and impairments:  Increased fascial restricitons, Pain, Hypomobility, Postural dysfunction, Impaired flexibility  Visit Diagnosis: Cervicalgia  Abnormal posture  Other symptoms and signs involving the musculoskeletal system     Problem List Patient Active Problem List   Diagnosis Date Noted  . Foraminal stenosis of cervical region 02/29/2020  . OSA (obstructive sleep apnea) 02/16/2020  . Memory difficulties 02/16/2020  . Sebaceous cyst back of neck 06/23/2017  . Osteoarthritis of left acromioclavicular joint 08/12/2012  . Obesity (BMI 30-39.9) 07/31/2012  . Rotator cuff syndrome of left shoulder 07/31/2012  . Venous insufficiency (chronic) (peripheral) 04/20/2012  . Hypogonadism in male 02/05/2010  . WEIGHT GAIN 10/20/2009    Solon Palm PT 03/21/2020, 10:41 AM  [facility] [address] [place] [address] [place], [place], [address] Phone: [phone]   Fax:  [phone]  Name: Dustin Kennedy MRN: [id number] Date of Birth: [birth year]

## 2020-03-22 ENCOUNTER — Telehealth: Payer: Self-pay | Admitting: Family Medicine

## 2020-03-22 NOTE — Telephone Encounter (Signed)
Appointment has been made. Patient wanted to know if this was okay with PCP?

## 2020-03-22 NOTE — Telephone Encounter (Signed)
Patient being seen with Enid Skeens, and patient wanted to know if it was okay with PCP for him to see someone else.

## 2020-03-23 ENCOUNTER — Encounter: Payer: Self-pay | Admitting: Rehabilitative and Restorative Service Providers"

## 2020-03-23 ENCOUNTER — Other Ambulatory Visit: Payer: Self-pay

## 2020-03-23 ENCOUNTER — Ambulatory Visit: Payer: BC Managed Care – PPO | Admitting: Family Medicine

## 2020-03-23 ENCOUNTER — Ambulatory Visit (INDEPENDENT_AMBULATORY_CARE_PROVIDER_SITE_OTHER): Payer: BC Managed Care – PPO | Admitting: Rehabilitative and Restorative Service Providers"

## 2020-03-23 ENCOUNTER — Encounter: Payer: Self-pay | Admitting: Nurse Practitioner

## 2020-03-23 ENCOUNTER — Ambulatory Visit (INDEPENDENT_AMBULATORY_CARE_PROVIDER_SITE_OTHER): Payer: BC Managed Care – PPO | Admitting: Nurse Practitioner

## 2020-03-23 VITALS — BP 127/81 | HR 73 | Temp 98.0°F | Ht 70.0 in | Wt 274.0 lb

## 2020-03-23 DIAGNOSIS — M542 Cervicalgia: Secondary | ICD-10-CM

## 2020-03-23 DIAGNOSIS — Z23 Encounter for immunization: Secondary | ICD-10-CM | POA: Diagnosis not present

## 2020-03-23 DIAGNOSIS — R293 Abnormal posture: Secondary | ICD-10-CM | POA: Diagnosis not present

## 2020-03-23 DIAGNOSIS — G8929 Other chronic pain: Secondary | ICD-10-CM | POA: Diagnosis not present

## 2020-03-23 DIAGNOSIS — R29898 Other symptoms and signs involving the musculoskeletal system: Secondary | ICD-10-CM

## 2020-03-23 DIAGNOSIS — R519 Headache, unspecified: Secondary | ICD-10-CM

## 2020-03-23 DIAGNOSIS — G43909 Migraine, unspecified, not intractable, without status migrainosus: Secondary | ICD-10-CM | POA: Insufficient documentation

## 2020-03-23 MED ORDER — CYCLOBENZAPRINE HCL 10 MG PO TABS: 10.0000 mg | ORAL_TABLET | Freq: Three times a day (TID) | ORAL | 1 refills | Status: DC | PRN

## 2020-03-23 NOTE — Therapy (Signed)
[facility] [place] [address] [place] [address] [place], [place], [address] Phone: [phone]   Fax:  [phone]  Physical Therapy Treatment  Patient Details  Name: Dustin Kennedy MRN: [id number] Date of Birth: 1965/12/19 Referring Provider (PT): Nani Gasser, MD   Encounter Date: 03/23/2020   PT End of Session - 03/23/20 0840    Visit Number 3    Number of Visits 12    Date for PT Re-Evaluation 04/25/20    Authorization Type BCBS    PT Start Time 0800    PT Stop Time 0845    PT Time Calculation (min) 45 min    Activity Tolerance Patient tolerated treatment well    Behavior During Therapy [facility] for tasks assessed/performed           Past Medical History:  Diagnosis Date  . Hyperlipidemia   . Hypertension     Past Surgical History:  Procedure Laterality Date  . lipomas removal     removal X 4  . LT shoulder surgery  1997   for bone spurs    There were no vitals filed for this visit.   Subjective Assessment - 03/23/20 0803    Subjective The patient reports his symptoms are improved today.    Diagnostic tests C3-C5 facet degeneration    Patient Stated Goals Reducing pain in his neck (responded well to prior PT)    Currently in Pain? Yes    Pain Score 2     Pain Location Neck   and HA   Pain Orientation Right    Pain Descriptors / Indicators Aching;Sore    Pain Type Acute pain    Pain Onset More than a month ago    Pain Frequency Intermittent    Aggravating Factors  worse with looking at monitors, head rotation R    Pain Relieving Factors head stationery              [facility] PT Assessment - 03/23/20 0804      Assessment   Medical Diagnosis chronic HA, chronic neck pain, forarminal stenosis    Referring Provider (PT) Nani Gasser, MD    Onset Date/Surgical Date 02/29/20    Hand Dominance Right      AROM   Cervical - Right Rotation 55   82 deg R rotation after manual and DN   Cervical - Left Rotation 72                           [facility] Adult PT Treatment/Exercise - 03/23/20 0804      Self-Care   Self-Care Other Self-Care Comments    Other Self-Care Comments  answered questions re: postural straps-- recommend strengthening muscles versus external support      Exercises   Exercises Neck      Neck Exercises: Machines for Strengthening   UBE (Upper Arm Bike) L2 x 2 minutes fwd/2 minutes backwards      Neck Exercises: Seated   Neck Retraction 10 reps    Cervical Rotation Right;5 reps    Cervical Rotation Limitations limited to 55 deg prior to intervention and improved immediately after STM joint mobs, and DN to 82 degrees    Lateral Flexion Right;Left;5 reps    Lateral Flexion Limitations for stretching    Shoulder Rolls Backwards;10 reps    Other Seated Exercise first rib depression    Other Seated Exercise upper trap stretch, attempted levator stretch, but painful  Neck Exercises: Supine   Cervical Isometrics Right lateral flexion;Right rotation      Neck Exercises: Prone   Shoulder Extension 10 reps    Other Prone Exercise Prone shoulder retraction       Modalities   Modalities Moist Heat      Moist Heat Therapy   Number Minutes Moist Heat 5 Minutes    Moist Heat Location Cervical   at end while getting HEP together     Manual Therapy   Manual Therapy Joint mobilization;Soft tissue mobilization    Manual therapy comments to reduce pain and muscle guarding; monitoring of ST during DN    Joint Mobilization Sidelying Cervical lateral glides, supine PA mobs c-spine.    Soft tissue mobilization STM to R upper trap, scalenes, levator, splenius capitus and deep paraspinals            Trigger Point Dry Needling - 03/23/20 0844    Consent Given? Yes    Education Handout Provided Previously provided    Muscles Treated Head and Neck Upper trapezius;Oblique capitus    Dry Needling Comments prone    Upper Trapezius Response Twitch reponse elicited;Palpable  increased muscle length    Oblique Capitus Response Twitch response elicited                     PT Long Term Goals - 03/14/20 0859      PT LONG TERM GOAL #1   Title The patient will be indep with HEP.    Time 6    Period Weeks    Target Date 04/25/20      PT LONG TERM GOAL #2   Title The patient will report reduction in functional limitations improving FOTO to < or equal to 33% limitation.    Time 6    Period Weeks    Target Date 04/25/20      PT LONG TERM GOAL #3   Title The patient will improve cervical rotation to > or equal to 70 degrees bilaterally.    Time 6    Period Weeks    Target Date 04/25/20      PT LONG TERM GOAL #4   Title The patient will report reduced frequency of HA to < or equal to 3 days/week.    Baseline from daily    Time 6    Period Weeks    Target Date 04/25/20      PT LONG TERM GOAL #5   Title The patient will report resting neck pain < or equal to 2/10.    Baseline 7/10    Time 6    Period Weeks    Target Date 04/25/20                 Plan - 03/23/20 0840    Clinical Impression Statement The patient's pain is responding well to PT (DN, STM, ther ex) with pain 2/10 today.  He continues with palpable myofascial banding and trigger points that respond well to STM and dry needling.  We also increased scapular stabilization exercises and added first rib depression mob to home.  Continue to LTGs.    PT Treatment/Interventions ADLs/Self Care Home Management;Therapeutic activities;Therapeutic exercise;Electrical Stimulation;Manual techniques;Dry needling;Patient/family education;Traction;Moist Heat;Taping;Passive range of motion    PT Next Visit Plan c-spine stabilization and posterior shoulder girdle strengthening, flexibility, DN and modalities as needed, STM/ trigger point release    PT Home Exercise Plan Access Code: [email or web address]    Consulted and Agree with Plan of  Care Patient           Patient will benefit from skilled  therapeutic intervention in order to improve the following deficits and impairments:  Increased fascial restricitons, Pain, Hypomobility, Postural dysfunction, Impaired flexibility  Visit Diagnosis: Cervicalgia  Abnormal posture  Other symptoms and signs involving the musculoskeletal system     Problem List Patient Active Problem List   Diagnosis Date Noted  . Foraminal stenosis of cervical region 02/29/2020  . OSA (obstructive sleep apnea) 02/16/2020  . Memory difficulties 02/16/2020  . Sebaceous cyst back of neck 06/23/2017  . Osteoarthritis of left acromioclavicular joint 08/12/2012  . Obesity (BMI 30-39.9) 07/31/2012  . Rotator cuff syndrome of left shoulder 07/31/2012  . Venous insufficiency (chronic) (peripheral) 04/20/2012  . Hypogonadism in male 02/05/2010  . WEIGHT GAIN 10/20/2009    Ezekiel Menzer, PT 03/23/2020, 8:47 AM  [facility] [address], [place], [address] Phone: [phone]   Fax:  [phone]  Name: YOUSSOUF SHIPLEY MRN: [id number] Date of Birth: [birth year]

## 2020-03-23 NOTE — Progress Notes (Signed)
Acute Office Visit  Subjective:    Patient ID: Dustin Kennedy, male    DOB: 1965-09-18, 54 y.o.   MRN: [id number]  Chief Complaint  Patient presents with  . Headache    follow up from 03/15/20    HPI Dustin Kennedy is a 55 year old male presenting today for follow-up for chronic frontal headaches associated with cervical and trapezius muscle tension.  He has had this issue about 3 years ago and went through PT which was helpful for his symptoms.  He has been going to physical therapy and just completed his second session.  They have been performing dry needling, cervical exercises, and manipulation of the tissue.  He does feel that the first session of dry needling was helpful.  It has not been for just a few minutes since his second session and therefore he is unsure if this session was helpful or not.  He reports he has been using Biofreeze to the area and a home TENS unit which he feels have been very helpful.  He has also been using the Relpax every once in a while which he finds very helpful however insurance will only cover 10/month and so he is limited in the amount he can use this.  In the interim he is trying Excedrin Migraine.  He reports that he has had severe frontal headaches for approximately 7 weeks.  He has been unable to focus, lethargic, and it is affecting his work.  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    He does endorse sensitivity to light and sound.  He does deny nausea, vomiting, radicular symptoms.  Past Medical History:  Diagnosis Date  . Hyperlipidemia   . Hypertension     Past Surgical History:  Procedure Laterality Date  . lipomas removal     removal X 4  . LT shoulder surgery  1997   for bone spurs    Family History  Problem Relation Age of Onset  . Hypertension Unknown        family history  . Hyperlipidemia Unknown        family history  . Thyroid  disease Unknown        family history  . Stroke Unknown        family history    Social History   Socioeconomic History  . Marital status: Married    Spouse name: [name]  . Number of children: 2  . Years of education: College  . Highest education level: Not on file  Occupational History  . Occupation: [employer]  Tobacco Use  . Smoking status: Never Smoker  . Smokeless tobacco: Never Used  Substance and Sexual Activity  . Alcohol use: Yes    Comment: 1-4 drinks per month or less  . Drug use: No  . 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  Other Topics Concern  . Not on file  Social History Narrative   Lives w/ wife   Caffeine use: 2-3 cups coffee per day   Social Determinants of Health   Financial Resource Strain:   . Difficulty of Paying Living Expenses: Not on file  Food Insecurity:   . Worried About Programme researcher, broadcasting/film/video in the Last Year: Not on file  . Ran Out  of Food in the Last Year: Not on file  Transportation Needs:   . Lack of Transportation (Medical): Not on file  . Lack of Transportation (Non-Medical): Not on file  Physical Activity:   . Days of Exercise per Week: Not on file  . Minutes of Exercise per Session: Not on file  Stress:   . Feeling of Stress : Not on file  Social Connections:   . Frequency of Communication with Friends and Family: Not on file  . Frequency of Social Gatherings with Friends and Family: Not on file  . Attends Religious Services: Not on file  . Active Member of Clubs or Organizations: Not on file  . Attends Banker Meetings: Not on file  . Marital Status: Not on file  Intimate Partner Violence:   . Fear of Current or Ex-Partner: Not on file  . Emotionally Abused: Not on file  . Physically Abused: Not on file  . Sexually Abused: Not on file    Outpatient Medications Prior to  Visit  Medication Sig Dispense Refill  . amoxicillin-clavulanate (AUGMENTIN) 875-125 MG tablet Take 1 tablet by mouth 2 (two) times daily. 10 tablet 0  . eletriptan (RELPAX) 20 MG tablet Take 1 tablet (20 mg total) by mouth as needed for migraine or headache. May repeat in 2 hours if headache persists or recurs. 10 tablet 5  . Multiple Vitamins-Minerals (MULTIVITAMIN ADULT PO) Take by mouth.    . Omega-3 Fatty Acids (FISH OIL) 1000 MG CAPS Take by mouth.    . Testosterone 20.25 MG/ACT (1.62%) GEL APPLY 2 PUMP to 1 Upper arm AND 2 PUMPS to Opposite upper arm EVERY MORNING. TOTAL OF 4 PUMPS DAILY 150 g 3  . topiramate (TOPAMAX) 50 MG tablet Take 1 tablet (50 mg total) by mouth 2 (two) times daily. 60 tablet 1   No facility-administered medications prior to visit.    Allergies  Allergen Reactions  . Azucena Freed [Testosterone] Rash    Skin irritation      Objective:    Physical Exam Vitals and nursing note reviewed.  Constitutional:      Appearance: He is normal weight.  HENT:     Head: Normocephalic.  Eyes:     Extraocular Movements: Extraocular movements intact.     Pupils: Pupils are equal, round, and reactive to light.  Cardiovascular:     Rate and Rhythm: Normal rate and regular rhythm.     Heart sounds: Normal heart sounds.  Pulmonary:     Effort: Pulmonary effort is normal.     Breath sounds: Normal breath sounds.  Abdominal:     Palpations: Abdomen is soft.  Musculoskeletal:        General: Tenderness present.     Cervical back: Normal range of motion. Rigidity present.     Comments: There is significant muscular tension noted in the trapezius and cervical and upper thoracic spine on palpation. Range of motion is fairly intact with some limitations possibly due to the presence of a current headache.  Skin:    General: Skin is warm and dry.     Capillary Refill: Capillary refill takes less than 2 seconds.  Neurological:     Mental Status: He is alert and oriented to  person, place, and time.     Cranial Nerves: No cranial nerve deficit.     Motor: No weakness.     Gait: Gait normal.  Psychiatric:        Mood and Affect: Mood normal.  Speech: Speech normal.        Behavior: Behavior normal.     BP 127/81   Pulse 73   Temp 98 F (36.7 C) (Oral)   Ht 5\' 10"  (1.778 m)   Wt 274 lb (124.3 kg)   SpO2 96%   BMI 39.31 kg/m  Wt Readings from Last 3 Encounters:  03/23/20 274 lb (124.3 kg)  02/16/20 262 lb (118.8 kg)  06/23/19 250 lb (113.4 kg)    There are no preventive care reminders to display for this patient.  There are no preventive care reminders to display for this patient.   Lab Results  Component Value Date   TSH 1.60 02/16/2020   Lab Results  Component Value Date   WBC 5.8 02/16/2020   HGB 16.7 02/16/2020   HCT 47.7 02/16/2020   MCV 87.5 02/16/2020   PLT 258 02/16/2020   Lab Results  Component Value Date   NA 141 02/16/2020   K 4.7 02/16/2020   CO2 27 02/16/2020   GLUCOSE 97 02/16/2020   BUN 18 02/16/2020   CREATININE 1.05 02/16/2020   BILITOT 1.1 02/16/2020   ALKPHOS 65 10/01/2016   AST 20 02/16/2020   ALT 22 02/16/2020   PROT 6.4 02/16/2020   ALBUMIN 3.9 10/01/2016   CALCIUM 9.4 02/16/2020   Lab Results  Component Value Date   CHOL 175 06/30/2019   Lab Results  Component Value Date   HDL 51 06/30/2019   Lab Results  Component Value Date   LDLCALC 106 (H) 06/30/2019   Lab Results  Component Value Date   TRIG 89 06/30/2019   Lab Results  Component Value Date   CHOLHDL 3.4 06/30/2019   Lab Results  Component Value Date   HGBA1C 5.3 10/26/2013       Assessment & Plan:   Problem List Items Addressed This Visit      Other   Need for influenza vaccination   Relevant Orders   Flu Vaccine QUAD 36+ mos IM (Completed)   Chronic neck pain    Symptoms and presentation consistent with cervicalgia.  Review of recent MRI reveals no acute abnormalities with the exception of the presence of  possible frontal areas indicating migraine activity.  Given that physical therapy has been effective in the past I do feel that giving this method more time to be helpful for him.  Recommend utilizing ice and heat to the neck and shoulders in addition to 800 mg of ibuprofen up to every 8 hours as needed for pain.  Also recommend continuing use Biofreeze and TENS unit.  Discussed the option of TENS unit placement to the trapezius muscles where attention appears to be most prevalent. He may continue the Excedrin Migraine alternating with the ibuprofen to help with the headaches and saving the Relpax for events that these medications are not helpful. Recommend that he continue the PT exercises and dry needling. Suggested trying to readjust his computer screen and the use of bluelight glasses to help with symptoms. We will also add Flexeril to help with muscle spasms as I do believe these are contributing to the pain. Patient to follow-up if symptoms are not improved after 4 weeks of therapy and to consider meeting with Dr. 10/28/2013 for further evaluation.      Relevant Medications   cyclobenzaprine (FLEXERIL) 10 MG tablet   Chronic nonintractable headache - Primary    Symptoms and presentation consistent with chronic non-intractable headache associated with cervicalgia. Reviewed MRI which was fairly unremarkable.  Recommend that the patient continue PT for a minimum of 4 weeks and continue to utilize at home exercises. Recommend alternating Excedrin Migraine and ibuprofen as needed for headaches and saving the Relpax for headaches that are not relieved with over-the-counter treatment. Also recommend the use of ice and heat to the neck and upper shoulders in addition to TENS unit and Biofreeze. Have called in a prescription for Flexeril to be used at bedtime.  Did discuss the option that he can use this up to 3 times a day but recommend not using at times when he may need to be awake or drive. I am hopeful  that PT will help as it has in the past given the proper amount of time. Symptoms have not improved after 4 weeks of conservative measures recommend that he follow-up with Dr. Karie Schwalbe for further evaluation and recommendations for treatment.      Relevant Medications   cyclobenzaprine (FLEXERIL) 10 MG tablet       Meds ordered this encounter  Medications  . cyclobenzaprine (FLEXERIL) 10 MG tablet    Sig: Take 1 tablet (10 mg total) by mouth 3 (three) times daily as needed for muscle spasms.    Dispense:  60 tablet    Refill:  1   Follow-up if PT is ineffective after 4 weeks of treatment with Dr. Karie Schwalbe for further evaluation and recommendations.   Tollie EthSara E. Ashna Dorough, NP

## 2020-03-23 NOTE — Assessment & Plan Note (Signed)
Symptoms and presentation consistent with cervicalgia.  Review of recent MRI reveals no acute abnormalities with the exception of the presence of possible frontal areas indicating migraine activity.  Given that physical therapy has been effective in the past I do feel that giving this method more time to be helpful for him.  Recommend utilizing ice and heat to the neck and shoulders in addition to 800 mg of ibuprofen up to every 8 hours as needed for pain.  Also recommend continuing use Biofreeze and TENS unit.  Discussed the option of TENS unit placement to the trapezius muscles where attention appears to be most prevalent. He may continue the Excedrin Migraine alternating with the ibuprofen to help with the headaches and saving the Relpax for events that these medications are not helpful. Recommend that he continue the PT exercises and dry needling. Suggested trying to readjust his computer screen and the use of bluelight glasses to help with symptoms. We will also add Flexeril to help with muscle spasms as I do believe these are contributing to the pain. Patient to follow-up if symptoms are not improved after 4 weeks of therapy and to consider meeting with Dr. Karie Schwalbe for further evaluation.

## 2020-03-23 NOTE — Patient Instructions (Addendum)
Recommend 800 mg of ibuprofen every 8 hours as needed for headache and upper back pain.  I recommend trying blue light glasses while you are on the computer.  You can also try to adjust the computer screen so that you are not straining your back or your neck.   Recommend using the TENS unit on the trapezius.  I will call in Flexeril for you.  This medication will help with muscle spasms and muscle tension it can make you sleepy so try to avoid taking this during the day or when you need to focus. You may start by taking 1/2 of a tablet if you need it during the day at times when you do not have to be awake to see how the medication affects you. USE EXTREME CAUTION WHEN DRIVING if you have taken this medication during the day.   Recommend using ice for 20 minutes and then heat for 20 minutes up to 3-4 times a day as needed.  Continue the exercise provided by PT   Thoracic Strain A thoracic strain, which is sometimes called a mid-back strain, is an injury to the muscles or tendons that attach to the upper part of your back behind your chest. This type of injury occurs when a muscle is overstretched or overloaded. Thoracic strains can range from mild to severe. Mild strains may involve stretching a muscle or tendon without tearing it. These injuries may heal in 1-2 weeks. More severe strains involve tearing of muscle fibers or tendons. These will cause more pain and may take 6-8 weeks to heal. What are the causes? This condition may be caused by:  Trauma, such as a fall or a hit to the body.  Twisting or overstretching the back. This may result from doing activities that require a lot of energy, such as lifting heavy objects. In some cases, the cause may not be known. What increases the risk? This injury is more common in:  Athletes.  People with obesity. What are the signs or symptoms? The main symptom of this condition is pain in the middle back, especially with movement. Other symptoms  include:  Stiffness or limited range of motion.  Sudden muscle tightening (spasms). How is this diagnosed? This condition may be diagnosed based on:  Your symptoms.  Your medical history.  A physical exam.  Imaging tests, such as X-rays or an MRI. How is this treated? This condition may be treated with:  Resting the injured area.  Applying heat and cold to the injured area.  Over-the-counter medicines for pain and inflammation, such as NSAIDs.  Prescription pain medicine or muscle relaxants may be needed for a short time.  Physical therapy. This will involve doing stretching and strengthening exercises. Follow these instructions at home: Managing pain, stiffness, and swelling      If directed, put ice on the injured area. ? Put ice in a plastic bag. ? Place a towel between your skin and the bag. ? Leave the ice on for 20 minutes, 2-3 times a day.  If directed, apply heat to the affected area as often as told by your health care provider. Use the heat source that your health care provider recommends, such as a moist heat pack or a heating pad. ? Place a towel between your skin and the heat source. ? Leave the heat on for 20-30 minutes. ? Remove the heat if your skin turns bright red. This is especially important if you are unable to feel pain, heat, or cold.  You may have a greater risk of getting burned. Activity  Rest and return to your normal activities as told by your health care provider. Ask your health care provider what activities are safe for you.  Do exercises as told by your health care provider. Medicines  Take over-the-counter and prescription medicines only as told by your health care provider.  Ask your health care provider if the medicine prescribed to you: ? Requires you to avoid driving or using heavy machinery. ? Can cause constipation. You may need to take these actions to prevent or treat constipation:  Drink enough fluid to keep your urine  pale yellow.  Take over-the-counter or prescription medicines.  Eat foods that are high in fiber, such as beans, whole grains, and fresh fruits and vegetables.  Limit foods that are high in fat and processed sugars, such as fried or sweet foods. Injury prevention To prevent a future mid-back injury:  Always warm up properly before physical activity or sports.  Cool down and stretch after being active.  Use correct form when playing sports and lifting heavy objects. Bend your knees before you lift heavy objects.  Use good posture when sitting and standing.  Stay physically fit and maintain a healthy weight. ? Do at least 150 minutes of moderate-intensity exercise each week, such as brisk walking or water aerobics. ? Do strength exercises at least 2 times each week.  General instructions  Do not use any products that contain nicotine or tobacco, such as cigarettes, e-cigarettes, and chewing tobacco. If you need help quitting, ask your health care provider.  Keep all follow-up visits as told by your health care provider. This is important. Contact a health care provider if:  Your pain is not helped by medicine.  Your pain or stiffness is getting worse.  You develop pain or stiffness in your neck or lower back. Get help right away if you:  Have shortness of breath.  Have chest pain.  Develop numbness or weakness in your legs or arms.  Have involuntary loss of urine (urinary incontinence). Summary  A thoracic strain, which is sometimes called a mid-back strain, is an injury to the muscles or tendons that attach to the upper part of your back behind your chest.  This type of injury occurs when a muscle is overstretched or overloaded.  Rest and return to your normal activities as told by your health care provider. If directed, apply heat or ice to the affected area as often as told by your health care provider.  Take over-the-counter and prescription medicines only as told  by your health care provider.  Contact a health care provider if you have new or worsening symptoms. This information is not intended to replace advice given to you by your health care provider. Make sure you discuss any questions you have with your health care provider. Document Revised: 06/02/2018 Document Reviewed: 06/02/2018 Elsevier Patient Education  2020 [employer].

## 2020-03-23 NOTE — Assessment & Plan Note (Signed)
Symptoms and presentation consistent with chronic non-intractable headache associated with cervicalgia. Reviewed MRI which was fairly unremarkable. Recommend that the patient continue PT for a minimum of 4 weeks and continue to utilize at home exercises. Recommend alternating Excedrin Migraine and ibuprofen as needed for headaches and saving the Relpax for headaches that are not relieved with over-the-counter treatment. Also recommend the use of ice and heat to the neck and upper shoulders in addition to TENS unit and Biofreeze. Have called in a prescription for Flexeril to be used at bedtime.  Did discuss the option that he can use this up to 3 times a day but recommend not using at times when he may need to be awake or drive. I am hopeful that PT will help as it has in the past given the proper amount of time. Symptoms have not improved after 4 weeks of conservative measures recommend that he follow-up with Dr. Karie Schwalbe for further evaluation and recommendations for treatment.

## 2020-03-24 NOTE — Telephone Encounter (Signed)
Yes ok 

## 2020-03-28 ENCOUNTER — Other Ambulatory Visit: Payer: Self-pay

## 2020-03-28 ENCOUNTER — Encounter: Payer: Self-pay | Admitting: Physical Therapy

## 2020-03-28 ENCOUNTER — Ambulatory Visit (INDEPENDENT_AMBULATORY_CARE_PROVIDER_SITE_OTHER): Payer: BC Managed Care – PPO | Admitting: Physical Therapy

## 2020-03-28 DIAGNOSIS — M542 Cervicalgia: Secondary | ICD-10-CM | POA: Diagnosis not present

## 2020-03-28 DIAGNOSIS — R293 Abnormal posture: Secondary | ICD-10-CM

## 2020-03-28 DIAGNOSIS — R29898 Other symptoms and signs involving the musculoskeletal system: Secondary | ICD-10-CM | POA: Diagnosis not present

## 2020-03-28 NOTE — Therapy (Signed)
[place], [place], [address] Phone: [phone]   Fax:  [phone]  Physical Therapy Treatment  Patient Details  Name: Dustin Kennedy MRN: [id number] Date of Birth: May 27, 1966 Referring Provider (PT): Beatrice Lecher, MD   Encounter Date: 03/28/2020   PT End of Session - 03/28/20 0800    Visit Number 4    Number of Visits 12    Date for PT Re-Evaluation 04/25/20    Authorization Type BCBS    PT Start Time 0800    PT Stop Time 0845    PT Time Calculation (min) 45 min    Activity Tolerance Patient tolerated treatment well    Behavior During Therapy [facility] for tasks assessed/performed           Past Medical History:  Diagnosis Date  . Hyperlipidemia   . Hypertension     Past Surgical History:  Procedure Laterality Date  . lipomas removal     removal X 4  . LT shoulder surgery  1997   for bone spurs    There were no vitals filed for this visit.   Subjective Assessment - 03/28/20 0803    Subjective Felt it about 3 in the morning (pt indicating levator area).    Currently in Pain? Yes    Pain Score 3     Pain Location Neck    Pain Orientation Right    Pain Descriptors / Indicators Aching;Sore    Pain Type Acute pain                             [facility] Adult PT Treatment/Exercise - 03/28/20 0001      Neck Exercises: Machines for Strengthening   UBE (Upper Arm Bike) L4 x 4 min      Neck Exercises: Standing   Neck Retraction 10 reps    Neck Retraction Limitations green difficult    Other Standing Exercises double green band attached to mat table; UT shrugs x 10      Neck Exercises: Prone   Neck Retraction 10 reps    Neck Retraction Limitations 5 sec hold    Other Prone Exercise on elbows alt arm reach 5x5 sec bil      Manual Therapy   Manual Therapy Joint mobilization;Soft tissue mobilization;Taping    Manual therapy comments skilled palpation and monitoring  of soft tissues during DN    Joint Mobilization upper thoracic and cervical CPA; UPA right cervical    Soft tissue mobilization cervical parspinals    Kinesiotex Inhibit Muscle      Kinesiotix   Inhibit Muscle  dynamic tape to right levator and UT in shortened position            Trigger Point Dry Needling - 03/28/20 0001    Consent Given? Yes    Education Handout Provided Previously provided    Muscles Treated Head and Neck Upper trapezius;Levator scapulae;Cervical multifidi    Dry Needling Comments right    Upper Trapezius Response Twitch reponse elicited;Palpable increased muscle length    Levator Scapulae Response Palpable increased muscle length    Cervical multifidi Response Twitch reponse elicited;Palpable increased muscle length                PT Education - 03/28/20 1403    Education Details Educated on tape precautions and how/when to remove tape    Person(s) Educated Patient    Methods Explanation;Demonstration  Comprehension Verbalized understanding               PT Long Term Goals - 03/28/20 0805      PT LONG TERM GOAL #1   Title The patient will be indep with HEP.    Status Partially Met      PT LONG TERM GOAL #3   Title The patient will improve cervical rotation to > or equal to 70 degrees bilaterally.    Status On-going      PT LONG TERM GOAL #5   Title The patient will report resting neck pain < or equal to 2/10.    Status On-going                 Plan - 03/28/20 1405    Clinical Impression Statement Patient continues to report pain in the right levator and upper traps. Cervical posture and upper back posture is still poor in clinic. We worked on cervical strengthening today. Dynamic tape was applied to both cue the pt when head is forward and also to assist UT and levator to decrease strain on muscle.    PT Frequency 2x / week    PT Duration 6 weeks    PT Treatment/Interventions ADLs/Self Care Home Management;Therapeutic  activities;Therapeutic exercise;Electrical Stimulation;Manual techniques;Dry needling;Patient/family education;Traction;Moist Heat;Taping;Passive range of motion    PT Next Visit Plan assess tape; c-spine stabilization and posterior shoulder girdle strengthening, flexibility, DN and modalities as needed, STM/ trigger point release    PT Home Exercise Plan Access Code: [email or web address]    Consulted and Agree with Plan of Care Patient           Patient will benefit from skilled therapeutic intervention in order to improve the following deficits and impairments:  Increased fascial restricitons, Pain, Hypomobility, Postural dysfunction, Impaired flexibility  Visit Diagnosis: Cervicalgia  Abnormal posture  Other symptoms and signs involving the musculoskeletal system     Problem List Patient Active Problem List   Diagnosis Date Noted  . Need for influenza vaccination 03/23/2020  . Chronic neck pain 03/23/2020  . Chronic nonintractable headache 03/23/2020  . Foraminal stenosis of cervical region 02/29/2020  . OSA (obstructive sleep apnea) 02/16/2020  . Memory difficulties 02/16/2020  . Sebaceous cyst back of neck 06/23/2017  . Osteoarthritis of left acromioclavicular joint 08/12/2012  . Obesity (BMI 30-39.9) 07/31/2012  . Rotator cuff syndrome of left shoulder 07/31/2012  . Venous insufficiency (chronic) (peripheral) 04/20/2012  . Hypogonadism in male 02/05/2010  . WEIGHT GAIN 10/20/2009   [name] PT 03/28/2020, 2:08 PM  [facility] [place], [place], [address] Phone: [phone]   Fax:  [phone]  Name: Dustin Kennedy MRN: [id number] Date of Birth: 24-Sep-1965

## 2020-03-30 ENCOUNTER — Encounter: Payer: Self-pay | Admitting: Physical Therapy

## 2020-03-30 ENCOUNTER — Other Ambulatory Visit: Payer: Self-pay

## 2020-03-30 ENCOUNTER — Ambulatory Visit (INDEPENDENT_AMBULATORY_CARE_PROVIDER_SITE_OTHER): Payer: BC Managed Care – PPO | Admitting: Physical Therapy

## 2020-03-30 DIAGNOSIS — R293 Abnormal posture: Secondary | ICD-10-CM | POA: Diagnosis not present

## 2020-03-30 DIAGNOSIS — M542 Cervicalgia: Secondary | ICD-10-CM | POA: Diagnosis not present

## 2020-03-30 DIAGNOSIS — R29898 Other symptoms and signs involving the musculoskeletal system: Secondary | ICD-10-CM

## 2020-03-30 NOTE — Therapy (Signed)
[place], [place], [address] Phone: [phone]   Fax:  [phone]  Physical Therapy Treatment  Patient Details  Name: Dustin Kennedy MRN: [id number] Date of Birth: 24-Jul-1966 Referring Provider (PT): Beatrice Lecher, MD   Encounter Date: 03/30/2020   PT End of Session - 03/30/20 0801    Visit Number 5    Number of Visits 12    Date for PT Re-Evaluation 04/25/20    Authorization Type BCBS    PT Start Time 0801    PT Stop Time 0845    PT Time Calculation (min) 44 min    Activity Tolerance Patient tolerated treatment well    Behavior During Therapy [facility] for tasks assessed/performed           Past Medical History:  Diagnosis Date  . Hyperlipidemia   . Hypertension     Past Surgical History:  Procedure Laterality Date  . lipomas removal     removal X 4  . LT shoulder surgery  1997   for bone spurs    There were no vitals filed for this visit.   Subjective Assessment - 03/30/20 0802    Subjective No headache today. The tape did well but didn't last the whole day.    Diagnostic tests C3-C5 facet degeneration    Patient Stated Goals Reducing pain in his neck (responded well to prior PT)    Currently in Pain? No/denies                             [facility] Adult PT Treatment/Exercise - 03/30/20 0001      Neck Exercises: Machines for Strengthening   UBE (Upper Arm Bike) L4 x 4 min   2 min ea way   Cybex Row single arm 50# lawn mower 2x10    Lat Pull 50# 2x10    Other Machines for Strengthening UT shrug with pulley 60# 2x10      Neck Exercises: Supine   Neck Retraction 10 reps;5 secs    Neck Retraction Limitations into green theraball    X to V 15 reps   with neck retraction: 1x5 with no wt   X to V Weights (lbs) 3    X to V Limitations head on green theraball    Shoulder Flexion Both;10 reps   with neck retraction   Shoulder Flexion Weights (lbs) 3    Shoulder  Flexion Limitations head on green theraball   cues to go slowly     Neck Exercises: Prone   Neck Retraction 10 reps    Neck Retraction Limitations 5 sec hold    Other Prone Exercise on elbows alt arm reach 5x5 sec bil   cues to stabilize entire spine to avoid rocking     Manual Therapy   Manual Therapy Joint mobilization;Taping    Joint Mobilization upper thoracic and cervical CPA; UPA bil cervical    Kinesiotex Inhibit Muscle      Kinesiotix   Inhibit Muscle  dynamic tape to right levator and UT in shortened position                       PT Long Term Goals - 03/30/20 0826      PT LONG TERM GOAL #3   Title The patient will improve cervical rotation to > or equal to 70 degrees bilaterally.    Baseline 70 deg left, 60  deg right    Status Partially Met      PT LONG TERM GOAL #4   Title The patient will report reduced frequency of HA to < or equal to 3 days/week.    Status On-going                 Plan - 03/30/20 0849    Clinical Impression Statement Patient slowly progressing with pain control and headaches. Last three days HA have steadily declined with no HA yet today. Patient did well with increased strengthening today without complaint.    PT Treatment/Interventions ADLs/Self Care Home Management;Therapeutic activities;Therapeutic exercise;Electrical Stimulation;Manual techniques;Dry needling;Patient/family education;Traction;Moist Heat;Taping;Passive range of motion    PT Next Visit Plan assess tape; progress HEP; c-spine stabilization and posterior shoulder girdle strengthening, flexibility, DN and modalities as needed, STM/ trigger point release    PT Home Exercise Plan Access Code: [email or web address]           Patient will benefit from skilled therapeutic intervention in order to improve the following deficits and impairments:  Increased fascial restricitons, Pain, Hypomobility, Postural dysfunction, Impaired flexibility  Visit  Diagnosis: Cervicalgia  Abnormal posture  Other symptoms and signs involving the musculoskeletal system     Problem List Patient Active Problem List   Diagnosis Date Noted  . Need for influenza vaccination 03/23/2020  . Chronic neck pain 03/23/2020  . Chronic nonintractable headache 03/23/2020  . Foraminal stenosis of cervical region 02/29/2020  . OSA (obstructive sleep apnea) 02/16/2020  . Memory difficulties 02/16/2020  . Sebaceous cyst back of neck 06/23/2017  . Osteoarthritis of left acromioclavicular joint 08/12/2012  . Obesity (BMI 30-39.9) 07/31/2012  . Rotator cuff syndrome of left shoulder 07/31/2012  . Venous insufficiency (chronic) (peripheral) 04/20/2012  . Hypogonadism in male 02/05/2010  . WEIGHT GAIN 10/20/2009    Julie Riddles PT 03/30/2020, 8:55 AM  [facility] [address] [place], [place], [address] Phone: [phone]   Fax:  [phone]  Name: Dustin Kennedy MRN: [id number] Date of Birth: 04/30/1966   

## 2020-04-04 ENCOUNTER — Encounter: Payer: BC Managed Care – PPO | Admitting: Rehabilitative and Restorative Service Providers"

## 2020-04-06 ENCOUNTER — Other Ambulatory Visit: Payer: Self-pay

## 2020-04-06 ENCOUNTER — Ambulatory Visit (INDEPENDENT_AMBULATORY_CARE_PROVIDER_SITE_OTHER): Payer: BC Managed Care – PPO | Admitting: Physical Therapy

## 2020-04-06 ENCOUNTER — Encounter: Payer: Self-pay | Admitting: Physical Therapy

## 2020-04-06 DIAGNOSIS — R293 Abnormal posture: Secondary | ICD-10-CM | POA: Diagnosis not present

## 2020-04-06 DIAGNOSIS — R29898 Other symptoms and signs involving the musculoskeletal system: Secondary | ICD-10-CM

## 2020-04-06 DIAGNOSIS — M542 Cervicalgia: Secondary | ICD-10-CM | POA: Diagnosis not present

## 2020-04-06 NOTE — Therapy (Signed)
[facility] [address] , [place], [address] Phone: [phone]   Fax:  [phone]  Physical Therapy Treatment  Patient Details  Name: Dustin Kennedy MRN: [id number] Date of Birth: 12/03/1965 Referring Provider (PT): Catherine Metheney, MD   Encounter Date: 04/06/2020   PT End of Session - 04/06/20 0802    Visit Number 6    Number of Visits 12    Date for PT Re-Evaluation 04/25/20    Authorization Type BCBS    PT Start Time 0802    PT Stop Time 0845    PT Time Calculation (min) 43 min    Activity Tolerance Patient tolerated treatment well    Behavior During Therapy [facility] for tasks assessed/performed           Past Medical History:  Diagnosis Date  . Hyperlipidemia   . Hypertension     Past Surgical History:  Procedure Laterality Date  . lipomas removal     removal X 4  . LT shoulder surgery  1997   for bone spurs    There were no vitals filed for this visit.   Subjective Assessment - 04/06/20 0803    Subjective Only 1 headache in the last 5 days. Pain is getting better. Sore right now because I ddin't stretch last night or this morning. Haven't taken any Motrin today. Only taking if I need to.    Diagnostic tests C3-C5 facet degeneration    Patient Stated Goals Reducing pain in his neck (responded well to prior PT)    Currently in Pain? Yes    Pain Score 3     Pain Location Neck    Pain Orientation Right    Pain Descriptors / Indicators Sore                             [facility] Adult PT Treatment/Exercise - 04/06/20 0001      Self-Care   Self-Care Other Self-Care Comments    Other Self-Care Comments  discussed ideas to simulate lat pulls at home with bands      Neck Exercises: Machines for Strengthening   UBE (Upper Arm Bike) L4 x 4 min   2 min ea way   Cybex Row single arm 40# lawn mower 2x10    Lat Pull 50# 2x10    Other Machines for Strengthening UT shrug with pulley 60#  2x10      Neck Exercises: Seated   Other Seated Exercise lat pull black on ball and 1/2 kneeling x 10 reps       Neck Exercises: Prone   Other Prone Exercise on green ball: neck retraction with alt single arm reach 2x10 5 sec hold      Manual Therapy   Manual Therapy Joint mobilization;Soft tissue mobilization    Manual therapy comments skilled palpation and monitoring of soft tissues during DN    Joint Mobilization UPA cerv mobs C3-T1/2            Trigger Point Dry Needling - 04/06/20 0001    Consent Given? Yes    Education Handout Provided Previously provided    Muscles Treated Head and Neck Cervical multifidi;Upper trapezius    Dry Needling Comments bil traps; else right    Upper Trapezius Response Twitch reponse elicited;Palpable increased muscle length    Cervical multifidi Response Twitch reponse elicited;Palpable increased muscle length                       PT Long Term Goals - 04/06/20 0825      PT LONG TERM GOAL #1   Title The patient will be indep with HEP.    Status Partially Met      PT LONG TERM GOAL #3   Title The patient will improve cervical rotation to > or equal to 70 degrees bilaterally.    Status Partially Met      PT LONG TERM GOAL #4   Title The patient will report reduced frequency of HA to < or equal to 3 days/week.    Status Partially Met      PT LONG TERM GOAL #5   Title The patient will report resting neck pain < or equal to 2/10.    Baseline 3/10    Status On-going                 Plan - 04/06/20 0857    Clinical Impression Statement Patient is progressing well toward goals. Resting pain level has come down from 7/10 to 3/10. He is still taking Motrin intermittently as needed. Good form with increased strengthening. Increased tone in right UT today. Very good response to DN with improved ROM with left rotation equal to right after manual,    Comorbidities migraine    Examination-Activity Limitations Lift     Examination-Participation Restrictions Occupation;Driving    PT Frequency 2x / week    PT Duration 6 weeks    PT Treatment/Interventions ADLs/Self Care Home Management;Therapeutic activities;Therapeutic exercise;Electrical Stimulation;Manual techniques;Dry needling;Patient/family education;Traction;Moist Heat;Taping;Passive range of motion    PT Next Visit Plan c-spine stabilization and posterior shoulder girdle strengthening, flexibility, DN and modalities as needed, STM/ trigger point release    PT Home Exercise Plan Access Code: [email or web address]    Consulted and Agree with Plan of Care Patient           Patient will benefit from skilled therapeutic intervention in order to improve the following deficits and impairments:  Increased fascial restricitons, Pain, Hypomobility, Postural dysfunction, Impaired flexibility  Visit Diagnosis: Cervicalgia  Abnormal posture  Other symptoms and signs involving the musculoskeletal system     Problem List Patient Active Problem List   Diagnosis Date Noted  . Need for influenza vaccination 03/23/2020  . Chronic neck pain 03/23/2020  . Chronic nonintractable headache 03/23/2020  . Foraminal stenosis of cervical region 02/29/2020  . OSA (obstructive sleep apnea) 02/16/2020  . Memory difficulties 02/16/2020  . Sebaceous cyst back of neck 06/23/2017  . Osteoarthritis of left acromioclavicular joint 08/12/2012  . Obesity (BMI 30-39.9) 07/31/2012  . Rotator cuff syndrome of left shoulder 07/31/2012  . Venous insufficiency (chronic) (peripheral) 04/20/2012  . Hypogonadism in male 02/05/2010  . WEIGHT GAIN 10/20/2009    Julie Riddles PT 04/06/2020, 9:21 AM  [facility] [address] [place], [place], [address] Phone: [phone]   Fax:  [phone]  Name: Dustin Kennedy MRN: [id number] Date of Birth: 06/27/1966   

## 2020-04-11 ENCOUNTER — Encounter: Payer: Self-pay | Admitting: Physical Therapy

## 2020-04-11 ENCOUNTER — Other Ambulatory Visit: Payer: Self-pay

## 2020-04-11 ENCOUNTER — Ambulatory Visit (INDEPENDENT_AMBULATORY_CARE_PROVIDER_SITE_OTHER): Payer: BC Managed Care – PPO | Admitting: Physical Therapy

## 2020-04-11 DIAGNOSIS — R293 Abnormal posture: Secondary | ICD-10-CM | POA: Diagnosis not present

## 2020-04-11 DIAGNOSIS — M542 Cervicalgia: Secondary | ICD-10-CM

## 2020-04-11 DIAGNOSIS — R29898 Other symptoms and signs involving the musculoskeletal system: Secondary | ICD-10-CM

## 2020-04-11 NOTE — Therapy (Signed)
[place], [place], [address] Phone: [phone]   Fax:  [phone]  Physical Therapy Treatment  Patient Details  Name: Dustin Kennedy MRN: [id number] Date of Birth: [birth year] Referring Provider (PT): Beatrice Lecher, MD   Encounter Date: 04/11/2020   PT End of Session - 04/11/20 0804    Visit Number 7    Number of Visits 12    Date for PT Re-Evaluation 04/25/20    Authorization Type BCBS    PT Start Time 0803    PT Stop Time 0845    PT Time Calculation (min) 42 min    Activity Tolerance Patient tolerated treatment well    Behavior During Therapy [facility] for tasks assessed/performed           Past Medical History:  Diagnosis Date  . Hyperlipidemia   . Hypertension     Past Surgical History:  Procedure Laterality Date  . lipomas removal     removal X 4  . LT shoulder surgery  1997   for bone spurs    There were no vitals filed for this visit.   Subjective Assessment - 04/11/20 0805    Subjective No HA since last visit. No pain above 3/10 now. Not taking meds. Stretching daily and using TENS prn.    Diagnostic tests C3-C5 facet degeneration    Patient Stated Goals Reducing pain in his neck (responded well to prior PT)    Currently in Pain? Yes    Pain Score 1     Pain Location Neck    Pain Orientation Right    Pain Descriptors / Indicators Sore              [facility] PT Assessment - 04/11/20 0001      AROM   Cervical - Right Rotation 58    Cervical - Left Rotation 72                         [facility] Adult PT Treatment/Exercise - 04/11/20 0001      Neck Exercises: Machines for Strengthening   UBE (Upper Arm Bike) L4 x 4 min   2 min ea way   Cybex Row single arm 40# lawn mower 2x10    Lat Pull 50# 2x10    Other Machines for Strengthening UT shrug with pulley 60# 2x10      Neck Exercises: Prone   W Back 10 reps    Upper Extremity Flexion with Stabilization 5  reps   scaption with chin tuck   Other Prone Exercise diamond arms x 5 reps with chin tuck (engage abs with all)      Manual Therapy   Manual Therapy Joint mobilization;Soft tissue mobilization    Manual therapy comments skilled palpation and monitoring of soft tissues during DN    Joint Mobilization UPA C2/3 - C5/6      Neck Exercises: Stretches   Other Neck Stretches prone neck rotation stretch bilt x 10 sec; also demo'd doorway stretch with head rotation    Other Neck Stretches ant neck stretch x 10 sec bil (WNL)            Trigger Point Dry Needling - 04/11/20 0001    Consent Given? Yes    Education Handout Provided Previously provided    Muscles Treated Head and Neck Cervical multifidi;Splenius capitus;Upper trapezius    Dry Needling Comments right    Upper Trapezius Response Twitch reponse elicited;Palpable  increased muscle length    Splenius capitus Response Palpable increased muscle length    Cervical multifidi Response Palpable increased muscle length                     PT Long Term Goals - 04/11/20 0810      PT LONG TERM GOAL #1   Title The patient will be indep with HEP.    Status Partially Met      PT LONG TERM GOAL #3   Baseline 72 deg left, 58 deg right    Status Partially Met      PT LONG TERM GOAL #4   Title The patient will report reduced frequency of HA to < or equal to 3 days/week.    Status Achieved      PT LONG TERM GOAL #5   Title The patient will report resting neck pain < or equal to 2/10.    Baseline 2-3/10    Status On-going                 Plan - 04/11/20 1205    Clinical Impression Statement Patient continues to progress toward LTGs. He has not had a HA since his last visit and is not taking meds daily any longer. He continues to have pain and tightness with right rotation.  He reports that he is very self aware of his head position during ADLs now.    Personal Factors and Comorbidities Comorbidity 1     Examination-Activity Limitations Lift    Examination-Participation Restrictions Occupation;Driving    PT Frequency 2x / week    PT Duration 6 weeks    PT Treatment/Interventions ADLs/Self Care Home Management;Therapeutic activities;Therapeutic exercise;Electrical Stimulation;Manual techniques;Dry needling;Patient/family education;Traction;Moist Heat;Taping;Passive range of motion    PT Next Visit Plan c-spine stabilization and posterior shoulder girdle strengthening, flexibility, DN and modalities as needed, STM/ trigger point release    PT Home Exercise Plan Access Code: [email or web address]    Consulted and Agree with Plan of Care Patient           Patient will benefit from skilled therapeutic intervention in order to improve the following deficits and impairments:  Increased fascial restricitons, Pain, Hypomobility, Postural dysfunction, Impaired flexibility  Visit Diagnosis: Cervicalgia  Abnormal posture  Other symptoms and signs involving the musculoskeletal system     Problem List Patient Active Problem List   Diagnosis Date Noted  . Need for influenza vaccination 03/23/2020  . Chronic neck pain 03/23/2020  . Chronic nonintractable headache 03/23/2020  . Foraminal stenosis of cervical region 02/29/2020  . OSA (obstructive sleep apnea) 02/16/2020  . Memory difficulties 02/16/2020  . Sebaceous cyst back of neck 06/23/2017  . Osteoarthritis of left acromioclavicular joint 08/12/2012  . Obesity (BMI 30-39.9) 07/31/2012  . Rotator cuff syndrome of left shoulder 07/31/2012  . Venous insufficiency (chronic) (peripheral) 04/20/2012  . Hypogonadism in male 02/05/2010  . WEIGHT GAIN 10/20/2009    [name] PT 04/11/2020, 12:12 PM  [facility] [place], [place], [address] Phone: [phone]   Fax:  [phone]  Name: Dustin Kennedy MRN: [id number] Date of Birth: [birth year]

## 2020-04-13 ENCOUNTER — Other Ambulatory Visit: Payer: Self-pay

## 2020-04-13 ENCOUNTER — Encounter: Payer: Self-pay | Admitting: Physical Therapy

## 2020-04-13 ENCOUNTER — Ambulatory Visit (INDEPENDENT_AMBULATORY_CARE_PROVIDER_SITE_OTHER): Payer: BC Managed Care – PPO | Admitting: Physical Therapy

## 2020-04-13 DIAGNOSIS — R29898 Other symptoms and signs involving the musculoskeletal system: Secondary | ICD-10-CM

## 2020-04-13 DIAGNOSIS — M542 Cervicalgia: Secondary | ICD-10-CM | POA: Diagnosis not present

## 2020-04-13 DIAGNOSIS — R293 Abnormal posture: Secondary | ICD-10-CM

## 2020-04-13 NOTE — Therapy (Signed)
[place], [place], [address] Phone: [phone]   Fax:  [phone]  Physical Therapy Treatment  Patient Details  Name: Dustin Kennedy MRN: [id number] Date of Birth: 17-Apr-1966 Referring Provider (PT): Beatrice Lecher, MD   Encounter Date: 04/13/2020   PT End of Session - 04/13/20 0848    Visit Number 8    Number of Visits 12    Date for PT Re-Evaluation 04/25/20    Authorization Type BCBS    PT Start Time 0848    PT Stop Time 0930    PT Time Calculation (min) 42 min    Activity Tolerance Patient tolerated treatment well    Behavior During Therapy [facility] for tasks assessed/performed           Past Medical History:  Diagnosis Date  . Hyperlipidemia   . Hypertension     Past Surgical History:  Procedure Laterality Date  . lipomas removal     removal X 4  . LT shoulder surgery  1997   for bone spurs    There were no vitals filed for this visit.   Subjective Assessment - 04/13/20 0850    Subjective Doing well. Got a work out in this morning. Had a slight HA last night, but I have a lot going on with work. It's gone this morning.    Diagnostic tests C3-C5 facet degeneration    Patient Stated Goals Reducing pain in his neck (responded well to prior PT)    Currently in Pain? Yes    Pain Score 1    indicates C/T junction today   Pain Location Neck    Pain Orientation Right    Pain Descriptors / Indicators Sore                             [facility] Adult PT Treatment/Exercise - 04/13/20 0001      Neck Exercises: Machines for Strengthening   UBE (Upper Arm Bike) L4 x 4 min   2 min ea way     Neck Exercises: Standing   Other Standing Exercises snow angel wall x 2 WNL      Neck Exercises: Sidelying   Other Sidelying Exercise open book x 5 bil      Neck Exercises: Prone   Other Prone Exercise quadriped open book x 5 bil    Other Prone Exercise quadriped thoracic  rounding (like serratus push) x 10      Manual Therapy   Manual Therapy Joint mobilization;Soft tissue mobilization;Myofascial release;Manual Traction    Joint Mobilization PA mobs lower cervical, thoracic and lumbar; C/T junction rotation mobs; right 1st rib mobs (mild pain caudally    Soft tissue mobilization to right rhomboids and thoracic paraspinals     Myofascial Release TPR to paraspinals and right QL in prone    Manual Traction distraction with rotation right x 5; then with strap distraction 10 sec hold x 3, then with right rotation x 3                       PT Long Term Goals - 04/11/20 0810      PT LONG TERM GOAL #1   Title The patient will be indep with HEP.    Status Partially Met      PT LONG TERM GOAL #3   Baseline 72 deg left, 58 deg right    Status Partially  Met      PT LONG TERM GOAL #4   Title The patient will report reduced frequency of HA to < or equal to 3 days/week.    Status Achieved      PT LONG TERM GOAL #5   Title The patient will report resting neck pain < or equal to 2/10.    Baseline 2-3/10    Status On-going                 Plan - 04/13/20 1305    Clinical Impression Statement Patient demonstrating full ROM bil today. Pain today more at right C/T junction. Mild pain with 1st rib mobs right. Tight thoracic paraspinals today and decreased mobility. HEP progressed to address this.    PT Treatment/Interventions ADLs/Self Care Home Management;Therapeutic activities;Therapeutic exercise;Electrical Stimulation;Manual techniques;Dry needling;Patient/family education;Traction;Moist Heat;Taping;Passive range of motion    PT Next Visit Plan c-spine stabilization and posterior shoulder girdle strengthening, flexibility, DN and modalities as needed, STM/ trigger point release    PT Home Exercise Plan Access Code: [email or web address]           Patient will benefit from skilled therapeutic intervention in order to improve the following deficits and  impairments:  Increased fascial restricitons, Pain, Hypomobility, Postural dysfunction, Impaired flexibility  Visit Diagnosis: Cervicalgia  Abnormal posture  Other symptoms and signs involving the musculoskeletal system     Problem List Patient Active Problem List   Diagnosis Date Noted  . Need for influenza vaccination 03/23/2020  . Chronic neck pain 03/23/2020  . Chronic nonintractable headache 03/23/2020  . Foraminal stenosis of cervical region 02/29/2020  . OSA (obstructive sleep apnea) 02/16/2020  . Memory difficulties 02/16/2020  . Sebaceous cyst back of neck 06/23/2017  . Osteoarthritis of left acromioclavicular joint 08/12/2012  . Obesity (BMI 30-39.9) 07/31/2012  . Rotator cuff syndrome of left shoulder 07/31/2012  . Venous insufficiency (chronic) (peripheral) 04/20/2012  . Hypogonadism in male 02/05/2010  . WEIGHT GAIN 10/20/2009    [name] PT 04/13/2020, 1:08 PM  [facility] [address] [place], [place], [address] Phone: [phone]   Fax:  [phone]  Name: Dustin Kennedy MRN: [id number] Date of Birth: 1965-09-05

## 2020-04-18 ENCOUNTER — Encounter: Payer: Self-pay | Admitting: Physical Therapy

## 2020-04-18 ENCOUNTER — Other Ambulatory Visit: Payer: Self-pay

## 2020-04-18 ENCOUNTER — Ambulatory Visit (INDEPENDENT_AMBULATORY_CARE_PROVIDER_SITE_OTHER): Payer: BC Managed Care – PPO | Admitting: Physical Therapy

## 2020-04-18 DIAGNOSIS — M542 Cervicalgia: Secondary | ICD-10-CM

## 2020-04-18 DIAGNOSIS — R29898 Other symptoms and signs involving the musculoskeletal system: Secondary | ICD-10-CM

## 2020-04-18 DIAGNOSIS — R293 Abnormal posture: Secondary | ICD-10-CM

## 2020-04-18 NOTE — Patient Instructions (Signed)
Access Code: [email or web address] URL: [email or web address] Date: 04/18/2020 Prepared by: Raynelle Fanning  Exercises Seated Gentle Upper Trapezius Stretch - 2 x daily - 7 x weekly - 1 sets - 3 reps - 20 seconds hold Gentle Levator Scapulae Stretch - 2 x daily - 7 x weekly - 1 sets - 3 reps - 20 seconds hold First Rib Mobilization with Strap - 2 x daily - 7 x weekly - 1 sets - 10 reps Seated Shoulder Circles - 2 x daily - 7 x weekly - 1 sets - 10 reps Standing Scapular Retraction in Abduction - 2 x daily - 7 x weekly - 1 sets - 10-15 reps Sidelying Thoracic Rotation with Open Book - 1 x daily - 7 x weekly - 2 sets - 5 reps Quadruped Thoracic Rotation With Arm Straight - 1 x daily - 7 x weekly - 1 sets - 10 reps Seated Cervical Traction - 1 x daily - 3 x weekly - 1 sets - 3 reps - 10 sec hold Seated Assisted Cervical Rotation with Towel - 1 x daily - 3 x weekly - 1 sets - 10 reps Standing Quadratus Lumborum Stretch with Doorway - 2 x daily - 7 x weekly - 1 sets - 2 reps - 60 sec hold Standing Thoracic Rotation with Dowel - 1 x daily - 7 x weekly - 3 sets - 10 reps

## 2020-04-18 NOTE — Therapy (Signed)
[facility] [place] [address] [place], [place], [address] Phone: [phone]   Fax:  [phone]  Physical Therapy Treatment  Patient Details  Name: Dustin Kennedy MRN: [id number] Date of Birth: November 25, 1965 Referring Provider (PT): Nani Gasser, MD   Encounter Date: 04/18/2020   PT End of Session - 04/18/20 1018    Visit Number 9    Number of Visits 12    Date for PT Re-Evaluation 04/25/20    Authorization Type BCBS    PT Start Time 1015    PT Stop Time 1058    PT Time Calculation (min) 43 min    Activity Tolerance Patient tolerated treatment well    Behavior During Therapy [facility] for tasks assessed/performed           Past Medical History:  Diagnosis Date  . Hyperlipidemia   . Hypertension     Past Surgical History:  Procedure Laterality Date  . lipomas removal     removal X 4  . LT shoulder surgery  1997   for bone spurs    There were no vitals filed for this visit.   Subjective Assessment - 04/18/20 1018    Subjective I feel good. I don't feel any pain. I had a HA yesterday. Took Excedrin and went away.    Diagnostic tests C3-C5 facet degeneration    Patient Stated Goals Reducing pain in his neck (responded well to prior PT)    Currently in Pain? No/denies              [facility] PT Assessment - 04/18/20 0001      AROM   Cervical - Right Rotation full    Cervical - Left Rotation full                         [facility] Adult PT Treatment/Exercise - 04/18/20 0001      Neck Exercises: Machines for Strengthening   UBE (Upper Arm Bike) L4 x 4 min   2 min ea way     Neck Exercises: Standing   Other Standing Exercises 1/2 kneel thoracic rotations with arm rainbows x 10 ea; then standing modification x 4 ea    Other Standing Exercises thoracic rotations using cane in standing x 2 ea      Neck Exercises: Seated   Cervical Rotation Right;Left;5 reps    Cervical Rotation Limitations  intermittently during treatment      Manual Therapy   Manual Therapy Joint mobilization;Soft tissue mobilization    Manual therapy comments skilled palpation and monitoring of soft tissues during DN    Joint Mobilization PA mobs lower cervical, thoracic; right 1st rib mobs     Soft tissue mobilization to right UT      Neck Exercises: Stretches   Other Neck Stretches standing QL stretch doorway 2x 30 sec            Trigger Point Dry Needling - 04/18/20 0001    Consent Given? Yes    Education Handout Provided Previously provided    Muscles Treated Head and Neck Upper trapezius;Cervical multifidi    Dry Needling Comments right C4/5, C5/6    Upper Trapezius Response Twitch reponse elicited;Palpable increased muscle length    Cervical multifidi Response Palpable increased muscle length                PT Education - 04/18/20 1249    Education Details HEP PROGRESSED    Person(s) Educated  Patient    Methods Explanation;Demonstration;Handout    Comprehension Verbalized understanding;Returned demonstration               PT Long Term Goals - 04/18/20 1245      PT LONG TERM GOAL #1   Title The patient will be indep with HEP.    Status Achieved      PT LONG TERM GOAL #3   Title The patient will improve cervical rotation to > or equal to 70 degrees bilaterally.    Status Achieved                 Plan - 04/18/20 1246    Clinical Impression Statement Naythen continues to make progress toward goals meeting his ROM goal today. He still experiences mild pain on the right with rotation only. Decreased pain with 1st rib mobs today.    PT Frequency 2x / week    PT Duration 6 weeks    PT Treatment/Interventions ADLs/Self Care Home Management;Therapeutic activities;Therapeutic exercise;Electrical Stimulation;Manual techniques;Dry needling;Patient/family education;Traction;Moist Heat;Taping;Passive range of motion    PT Next Visit Plan FOTO; pt should be ready for d/c soon.  continue mobs/manual prn.    PT Home Exercise Plan Access Code: [email or web address]           Patient will benefit from skilled therapeutic intervention in order to improve the following deficits and impairments:  Increased fascial restricitons, Pain, Hypomobility, Postural dysfunction, Impaired flexibility  Visit Diagnosis: Cervicalgia  Abnormal posture  Other symptoms and signs involving the musculoskeletal system     Problem List Patient Active Problem List   Diagnosis Date Noted  . Need for influenza vaccination 03/23/2020  . Chronic neck pain 03/23/2020  . Chronic nonintractable headache 03/23/2020  . Foraminal stenosis of cervical region 02/29/2020  . OSA (obstructive sleep apnea) 02/16/2020  . Memory difficulties 02/16/2020  . Sebaceous cyst back of neck 06/23/2017  . Osteoarthritis of left acromioclavicular joint 08/12/2012  . Obesity (BMI 30-39.9) 07/31/2012  . Rotator cuff syndrome of left shoulder 07/31/2012  . Venous insufficiency (chronic) (peripheral) 04/20/2012  . Hypogonadism in male 02/05/2010  . WEIGHT GAIN 10/20/2009    Khyri Hinzman PT 04/18/2020, 12:52 PM  [facility] [address] [place], [place], [address] Phone: [phone]   Fax:  [phone]  Name: Dustin Kennedy MRN: [id number] Date of Birth: [birth year]

## 2020-04-20 ENCOUNTER — Other Ambulatory Visit: Payer: Self-pay

## 2020-04-20 ENCOUNTER — Ambulatory Visit (INDEPENDENT_AMBULATORY_CARE_PROVIDER_SITE_OTHER): Payer: BC Managed Care – PPO | Admitting: Physical Therapy

## 2020-04-20 ENCOUNTER — Encounter: Payer: Self-pay | Admitting: Physical Therapy

## 2020-04-20 DIAGNOSIS — M542 Cervicalgia: Secondary | ICD-10-CM

## 2020-04-20 DIAGNOSIS — R29898 Other symptoms and signs involving the musculoskeletal system: Secondary | ICD-10-CM

## 2020-04-20 DIAGNOSIS — R293 Abnormal posture: Secondary | ICD-10-CM

## 2020-04-20 NOTE — Therapy (Signed)
[facility] [place] [address] [place], [place], [address] Phone: [phone]   Fax:  [phone]  Physical Therapy Treatment  Patient Details  Name: Dustin Kennedy MRN: [id number] Date of Birth: 04-21-1966 Referring Provider (PT): Nani Gasser, MD   Encounter Date: 04/20/2020   PT End of Session - 04/20/20 0800    Visit Number 10    Number of Visits 12    Date for PT Re-Evaluation 04/25/20    Authorization Type BCBS    PT Start Time 0800    PT Stop Time 0855    PT Time Calculation (min) 55 min    Activity Tolerance Patient tolerated treatment well    Behavior During Therapy [facility] for tasks assessed/performed           Past Medical History:  Diagnosis Date  . Hyperlipidemia   . Hypertension     Past Surgical History:  Procedure Laterality Date  . lipomas removal     removal X 4  . LT shoulder surgery  1997   for bone spurs    There were no vitals filed for this visit.   Subjective Assessment - 04/20/20 0800    Subjective HA 2/10, Feeling in the neck today too.    Diagnostic tests C3-C5 facet degeneration    Patient Stated Goals Reducing pain in his neck (responded well to prior PT)    Currently in Pain? Yes    Pain Score 2     Pain Location Neck    Pain Orientation Right    Pain Descriptors / Indicators Sore              [facility] PT Assessment - 04/20/20 0001      Observation/Other Assessments   Focus on Therapeutic Outcomes (FOTO)  30% limited                         [facility] Adult PT Treatment/Exercise - 04/20/20 0001      Neck Exercises: Machines for Strengthening   UBE (Upper Arm Bike) L4 x 4 min   2 min ea way     Neck Exercises: Standing   Upper Extremity D2 Flexion;10 reps    Theraband Level (UE D2) Level 4 (Blue)      Neck Exercises: Prone   Upper Extremity Flexion with Stabilization 20 reps    UE Flexion with Stabilization Limitations over ball; 2 sets with alt legs  also       Neck Exercises: Stabilization   Stabilization on greeen ball retraction with alt arm reach 10 sec hold      Modalities   Modalities Electrical Stimulation;Moist Heat      Moist Heat Therapy   Number Minutes Moist Heat 10 Minutes    Moist Heat Location Cervical      Electrical Stimulation   Electrical Stimulation Location neck    Electrical Stimulation Action IFC    Electrical Stimulation Parameters to tolerance x    Electrical Stimulation Goals Pain      Manual Therapy   Manual Therapy Joint mobilization;Soft tissue mobilization;Myofascial release    Joint Mobilization PA mobs lower cervical, thoracic; right 1st rib mobs     Soft tissue mobilization to right scalenes, UT    Myofascial Release OA release      Neck Exercises: Stretches   Other Neck Stretches SCM stretch 2x30 sec bil  PT Long Term Goals - 04/18/20 1245      PT LONG TERM GOAL #1   Title The patient will be indep with HEP.    Status Achieved      PT LONG TERM GOAL #3   Title The patient will improve cervical rotation to > or equal to 70 degrees bilaterally.    Status Achieved                 Plan - 04/20/20 1335    Clinical Impression Statement Patient with mild increase of pain and HA today. Likely still tender from previous visit. He did well with progression of stabilization exercises.    PT Treatment/Interventions ADLs/Self Care Home Management;Therapeutic activities;Therapeutic exercise;Electrical Stimulation;Manual techniques;Dry needling;Patient/family education;Traction;Moist Heat;Taping;Passive range of motion    PT Next Visit Plan D/C to hep           Patient will benefit from skilled therapeutic intervention in order to improve the following deficits and impairments:  Increased fascial restricitons, Pain, Hypomobility, Postural dysfunction, Impaired flexibility  Visit Diagnosis: Cervicalgia  Abnormal posture  Other symptoms and  signs involving the musculoskeletal system     Problem List Patient Active Problem List   Diagnosis Date Noted  . Need for influenza vaccination 03/23/2020  . Chronic neck pain 03/23/2020  . Chronic nonintractable headache 03/23/2020  . Foraminal stenosis of cervical region 02/29/2020  . OSA (obstructive sleep apnea) 02/16/2020  . Memory difficulties 02/16/2020  . Sebaceous cyst back of neck 06/23/2017  . Osteoarthritis of left acromioclavicular joint 08/12/2012  . Obesity (BMI 30-39.9) 07/31/2012  . Rotator cuff syndrome of left shoulder 07/31/2012  . Venous insufficiency (chronic) (peripheral) 04/20/2012  . Hypogonadism in male 02/05/2010  . WEIGHT GAIN 10/20/2009    Solon Palm PT 04/20/2020, 1:40 PM  [facility] [address] [place], [place], [address] Phone: [phone]   Fax:  [phone]  Name: Dustin Kennedy MRN: [id number] Date of Birth: 1966/07/18

## 2020-05-04 ENCOUNTER — Encounter: Payer: BC Managed Care – PPO | Admitting: Physical Therapy

## 2020-05-09 ENCOUNTER — Ambulatory Visit (INDEPENDENT_AMBULATORY_CARE_PROVIDER_SITE_OTHER): Payer: BC Managed Care – PPO | Admitting: Physical Therapy

## 2020-05-09 ENCOUNTER — Other Ambulatory Visit: Payer: Self-pay

## 2020-05-09 ENCOUNTER — Encounter: Payer: Self-pay | Admitting: Physical Therapy

## 2020-05-09 DIAGNOSIS — R29898 Other symptoms and signs involving the musculoskeletal system: Secondary | ICD-10-CM | POA: Diagnosis not present

## 2020-05-09 DIAGNOSIS — R293 Abnormal posture: Secondary | ICD-10-CM

## 2020-05-09 DIAGNOSIS — M542 Cervicalgia: Secondary | ICD-10-CM | POA: Diagnosis not present

## 2020-05-09 NOTE — Therapy (Addendum)
[place], [place], [address] Phone: [phone]   Fax:  [phone]  Physical Therapy Treatment and Discharge Summary  Patient Details  Name: Dustin Kennedy MRN: [id number] Date of Birth: Dec 24, 1965 Referring Provider (PT): Beatrice Lecher, MD   Encounter Date: 05/09/2020   PT End of Session - 05/09/20 0850    Visit Number 11    Number of Visits 12    Date for PT Re-Evaluation 04/25/20    Authorization Type BCBS    PT Start Time 0847    PT Stop Time 0926    PT Time Calculation (min) 39 min    Activity Tolerance Patient tolerated treatment well    Behavior During Therapy [facility] for tasks assessed/performed           Past Medical History:  Diagnosis Date  . Hyperlipidemia   . Hypertension     Past Surgical History:  Procedure Laterality Date  . lipomas removal     removal X 4  . LT shoulder surgery  1997   for bone spurs    There were no vitals filed for this visit.   Subjective Assessment - 05/09/20 0850    Subjective Overall patient feeling good. He still has tension HA intermittently but relates them primarily to screen time. Trigger point release technique helps them resolve.    Patient Stated Goals Reducing pain in his neck (responded well to prior PT)    Currently in Pain? Yes    Pain Score 2     Pain Location Neck    Pain Orientation Right    Pain Descriptors / Indicators Sore                             [facility] Adult PT Treatment/Exercise - 05/09/20 0001      Neck Exercises: Machines for Strengthening   UBE (Upper Arm Bike) L4 x 4 min   2 min ea way   Cybex Row single arm 40# lawn mower 2x10    Lat Pull 50# 2x10    Other Machines for Strengthening UT shrug with pulley 60# 2x10      Neck Exercises: Standing   Upper Extremity D2 Flexion;10 reps    Theraband Level (UE D2) Level 4 (Blue)    Other Standing Exercises standing neck diagonals x 5 ea       Neck Exercises: Seated   Cervical Rotation Right;Left;5 reps      Neck Exercises: Sidelying   Other Sidelying Exercise open book with blue band x 10 bil      Manual Therapy   Manual Therapy Joint mobilization;Soft tissue mobilization    Manual therapy comments skilled palpation and monitoring of soft tissues during DN    Joint Mobilization PA mobs lower cervical, thoracic    Soft tissue mobilization to right UT and cervical paraspinals            Trigger Point Dry Needling - 05/09/20 0001    Consent Given? Yes    Education Handout Provided Previously provided    Muscles Treated Head and Neck Upper trapezius    Dry Needling Comments right    Upper Trapezius Response Twitch reponse elicited;Palpable increased muscle length                     PT Long Term Goals - 05/09/20 0852      PT LONG TERM GOAL #1  Title The patient will be indep with HEP.    Status Achieved      PT LONG TERM GOAL #2   Title The patient will report reduction in functional limitations improving FOTO to < or equal to 33% limitation.    Status Achieved      PT LONG TERM GOAL #3   Title The patient will improve cervical rotation to > or equal to 70 degrees bilaterally.    Status Achieved      PT LONG TERM GOAL #4   Title The patient will report reduced frequency of HA to < or equal to 3 days/week.    Status Achieved      PT LONG TERM GOAL #5   Title The patient will report resting neck pain < or equal to 2/10.    Baseline 2-3/10    Status Partially Met                 Plan - 05/09/20 0930    Clinical Impression Statement Patient presents today for his final visit. He has met or partially met all LTGs and is feeling good overall. He still has resting soreness of 2-3/10 and has intermittent tension HA, but feels he can manage his symptoms with his HEP and self MFR. HEP was finalized. He did have TPs in the right UTs today which responded well to manual therapy.    Personal Factors  and Comorbidities Comorbidity 1    Comorbidities migraine    Examination-Participation Restrictions Occupation;Driving    PT Frequency 2x / week    PT Duration 6 weeks    PT Treatment/Interventions ADLs/Self Care Home Management;Therapeutic activities;Therapeutic exercise;Electrical Stimulation;Manual techniques;Dry needling;Patient/family education;Traction;Moist Heat;Taping;Passive range of motion    PT Next Visit Plan see d/c summary    PT Home Exercise Plan Access Code: [email or web address]    Consulted and Agree with Plan of Care Patient           Patient will benefit from skilled therapeutic intervention in order to improve the following deficits and impairments:  Increased fascial restricitons, Pain, Hypomobility, Postural dysfunction, Impaired flexibility  Visit Diagnosis: Cervicalgia  Abnormal posture  Other symptoms and signs involving the musculoskeletal system     Problem List Patient Active Problem List   Diagnosis Date Noted  . Need for influenza vaccination 03/23/2020  . Chronic neck pain 03/23/2020  . Chronic nonintractable headache 03/23/2020  . Foraminal stenosis of cervical region 02/29/2020  . OSA (obstructive sleep apnea) 02/16/2020  . Memory difficulties 02/16/2020  . Sebaceous cyst back of neck 06/23/2017  . Osteoarthritis of left acromioclavicular joint 08/12/2012  . Obesity (BMI 30-39.9) 07/31/2012  . Rotator cuff syndrome of left shoulder 07/31/2012  . Venous insufficiency (chronic) (peripheral) 04/20/2012  . Hypogonadism in male 02/05/2010  . WEIGHT GAIN 10/20/2009    [name] PT 05/09/2020, 10:43 AM  [facility] [place], [place], [address] Phone: [phone]   Fax:  [phone]  Name: Dustin Kennedy MRN: [id number] Date of Birth: 07/18/1966  PHYSICAL THERAPY DISCHARGE SUMMARY  Visits from Start of Care: 11  Current functional level related to goals / functional  outcomes: See above   Remaining deficits: See above   Education / Equipment: HEP Plan: Patient agrees to discharge.  Patient goals were partially met. Patient is being discharged due to being pleased with the current functional level.  ?????    [name], PT 05/09/20 10:45 AM  [facility]  [place], [place] [address]  [phone] (office) [phone] (fax)

## 2020-05-29 ENCOUNTER — Other Ambulatory Visit: Payer: Self-pay | Admitting: Family Medicine

## 2020-06-28 ENCOUNTER — Other Ambulatory Visit: Payer: Self-pay | Admitting: Family Medicine

## 2020-07-29 ENCOUNTER — Other Ambulatory Visit: Payer: Self-pay | Admitting: Family Medicine

## 2020-08-26 ENCOUNTER — Other Ambulatory Visit: Payer: Self-pay | Admitting: Nurse Practitioner

## 2020-08-26 ENCOUNTER — Other Ambulatory Visit: Payer: Self-pay | Admitting: Family Medicine

## 2020-08-26 DIAGNOSIS — G8929 Other chronic pain: Secondary | ICD-10-CM

## 2020-08-26 DIAGNOSIS — R519 Headache, unspecified: Secondary | ICD-10-CM

## 2020-08-26 DIAGNOSIS — M542 Cervicalgia: Secondary | ICD-10-CM

## 2020-08-28 NOTE — Telephone Encounter (Signed)
Patient and let him know he is due for 23-month follow-up for his testosterone.

## 2020-08-31 NOTE — Telephone Encounter (Signed)
I called pt and scheduled for 09/11/20

## 2020-09-03 ENCOUNTER — Other Ambulatory Visit: Payer: Self-pay | Admitting: Family Medicine

## 2020-09-11 ENCOUNTER — Ambulatory Visit: Payer: BC Managed Care – PPO | Admitting: Family Medicine

## 2020-09-11 NOTE — Progress Notes (Deleted)
Established Patient Office Visit  Subjective:  Patient ID: Dustin Kennedy, male    DOB: 1966-07-11  Age: 55 y.o. MRN: [id number]  CC: No chief complaint on file.   HPI Dustin Kennedy presents for 8-month follow-up.  Hypogonadism-currently on testosterone gel.  Lab Results  Component Value Date   TESTOSTERONE 736 02/16/2020    Chronic headaches-   Past Medical History:  Diagnosis Date  . Hyperlipidemia   . Hypertension     Past Surgical History:  Procedure Laterality Date  . lipomas removal     removal X 4  . LT shoulder surgery  1997   for bone spurs    Family History  Problem Relation Age of Onset  . Hypertension Unknown        family history  . Hyperlipidemia Unknown        family history  . Thyroid disease Unknown        family history  . Stroke Unknown        family history    Social History   Socioeconomic History  . Marital status: Married    Spouse name: [name]  . Number of children: 2  . Years of education: College  . Highest education level: Not on file  Occupational History  . Occupation: [employer]  Tobacco Use  . Smoking status: Never Smoker  . Smokeless tobacco: Never Used  Substance and Sexual Activity  . Alcohol use: Yes    Comment: 1-4 drinks per month or less  . Drug use: No  . 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  Other Topics Concern  . Not on file  Social History Narrative   Lives w/ wife   Caffeine use: 2-3 cups coffee per day   Social Determinants of Health   Financial Resource Strain: Not on file  Food Insecurity: Not on file  Transportation Needs: Not on file  Physical Activity: Not on file  Stress: Not on file  Social Connections: Not on file  Intimate Partner Violence: Not on file    Outpatient Medications Prior to Visit  Medication Sig Dispense Refill  .  cyclobenzaprine (FLEXERIL) 10 MG tablet TAKE 1 TABLET(10 MG) BY MOUTH THREE TIMES DAILY AS NEEDED FOR MUSCLE SPASMS 60 tablet 1  . eletriptan (RELPAX) 20 MG tablet Take 1 tablet (20 mg total) by mouth as needed for migraine or headache. May repeat in 2 hours if headache persists or recurs. 10 tablet 5  . Multiple Vitamins-Minerals (MULTIVITAMIN ADULT PO) Take by mouth.    . Omega-3 Fatty Acids (FISH OIL) 1000 MG CAPS Take by mouth.    . Testosterone 20.25 MG/ACT (1.62%) GEL APPLY 2 PUMP to 1 Upper arm AND 2 PUMPS to Opposite upper arm EVERY MORNING. TOTAL OF 4 PUMPS DAILY 150 g 3  . topiramate (TOPAMAX) 50 MG tablet TAKE 1 TABLET(50 MG) BY MOUTH TWICE DAILY 60 tablet 0   No facility-administered medications prior to visit.    Allergies  Allergen Reactions  . Azucena Freed [Testosterone] Rash    Skin irritation    ROS Review of Systems    Objective:    Physical Exam Constitutional:      Appearance: He is well-developed and well-nourished.  HENT:     Head: Normocephalic and atraumatic.  Cardiovascular:     Rate and Rhythm: Normal rate and regular rhythm.  Heart sounds: Normal heart sounds.  Pulmonary:     Effort: Pulmonary effort is normal.     Breath sounds: Normal breath sounds.  Skin:    General: Skin is warm and dry.  Neurological:     Mental Status: He is alert and oriented to person, place, and time.  Psychiatric:        Mood and Affect: Mood and affect normal.        Behavior: Behavior normal.     There were no vitals taken for this visit. Wt Readings from Last 3 Encounters:  03/23/20 274 lb (124.3 kg)  02/16/20 262 lb (118.8 kg)  06/23/19 250 lb (113.4 kg)     Health Maintenance Due  Topic Date Due  . HIV Screening  Never done  . COVID-19 Vaccine (3 - Pfizer risk 4-dose series) 11/21/2019    There are no preventive care reminders to display for this patient.  Lab Results  Component Value Date   TSH 1.60 02/16/2020   Lab Results  Component Value Date    WBC 5.8 02/16/2020   HGB 16.7 02/16/2020   HCT 47.7 02/16/2020   MCV 87.5 02/16/2020   PLT 258 02/16/2020   Lab Results  Component Value Date   NA 141 02/16/2020   K 4.7 02/16/2020   CO2 27 02/16/2020   GLUCOSE 97 02/16/2020   BUN 18 02/16/2020   CREATININE 1.05 02/16/2020   BILITOT 1.1 02/16/2020   ALKPHOS 65 10/01/2016   AST 20 02/16/2020   ALT 22 02/16/2020   PROT 6.4 02/16/2020   ALBUMIN 3.9 10/01/2016   CALCIUM 9.4 02/16/2020   Lab Results  Component Value Date   CHOL 175 06/30/2019   Lab Results  Component Value Date   HDL 51 06/30/2019   Lab Results  Component Value Date   LDLCALC 106 (H) 06/30/2019   Lab Results  Component Value Date   TRIG 89 06/30/2019   Lab Results  Component Value Date   CHOLHDL 3.4 06/30/2019   Lab Results  Component Value Date   HGBA1C 5.3 10/26/2013      Assessment & Plan:   Problem List Items Addressed This Visit      Endocrine   Hypogonadism in male     Other   Chronic nonintractable headache - Primary      No orders of the defined types were placed in this encounter.   Follow-up: No follow-ups on file.    Nani Gasser, MD

## 2020-09-22 ENCOUNTER — Encounter: Payer: Self-pay | Admitting: Family Medicine

## 2020-09-22 ENCOUNTER — Other Ambulatory Visit: Payer: Self-pay

## 2020-09-22 ENCOUNTER — Ambulatory Visit (INDEPENDENT_AMBULATORY_CARE_PROVIDER_SITE_OTHER): Payer: BC Managed Care – PPO | Admitting: Family Medicine

## 2020-09-22 VITALS — BP 142/66 | HR 73 | Ht 70.0 in | Wt 258.0 lb

## 2020-09-22 DIAGNOSIS — G4733 Obstructive sleep apnea (adult) (pediatric): Secondary | ICD-10-CM

## 2020-09-22 DIAGNOSIS — H9313 Tinnitus, bilateral: Secondary | ICD-10-CM | POA: Insufficient documentation

## 2020-09-22 DIAGNOSIS — E291 Testicular hypofunction: Secondary | ICD-10-CM

## 2020-09-22 DIAGNOSIS — Z13220 Encounter for screening for lipoid disorders: Secondary | ICD-10-CM | POA: Diagnosis not present

## 2020-09-22 NOTE — Patient Instructions (Signed)
Google Smart pillow  for sleep apnea/snoring.  It may be worth a try to see if this helps. You did have a sleep study done in 2018 showing some mild apnea it may be that it has progressed and gotten worse.  We can always consider getting another home sleep study to see if your numbers have changed.  Again this can worsen as you get a little older.

## 2020-09-22 NOTE — Assessment & Plan Note (Signed)
Doing well on his current regimen he feels like it is effective he is doing 2 pumps to each upper arm.  Due for repeat labs.  If everything looks great we will just continue for another 6 months and follow-up at that time.  Due for CBC, PSA and testosterone.

## 2020-09-22 NOTE — Progress Notes (Signed)
Established Patient Office Visit  Subjective:  Patient ID: Dustin Kennedy, male    DOB: 1965-11-01  Age: 55 y.o. MRN: [id number]  CC:  Chief Complaint  Patient presents with  . Hypogonadism    HPI Dustin Kennedy presents for   Follow-up of hypogonadism-he is currently on testosterone replacement therapy feels like it is doing really well he is applying 2 pumps to each outer arm and feels happy with his current regimen he is satisfied with the effects he denies any negative side effects.  He is due for blood work.  Headaches/migraines-he actually is doing really well on the topiramate he feels like it is been really helpful in controlling his headaches and in fact has not even used his Relpax recently.  Does report that his snoring has gotten a little bit worse to the point that his wife is threatening to have him sleep outside of the bedroom.  He did have a sleep study done in 2018 showing some mild apnea.  He is also having chronic tinnitus in both ears.  He says it sounds like a low level frequency.  But it is now constant.  No ear pain and he denies any hearing loss.    Past Medical History:  Diagnosis Date  . Hyperlipidemia   . Hypertension     Past Surgical History:  Procedure Laterality Date  . lipomas removal     removal X 4  . LT shoulder surgery  1997   for bone spurs    Family History  Problem Relation Age of Onset  . Hypertension Unknown        family history  . Hyperlipidemia Unknown        family history  . Thyroid disease Unknown        family history  . Stroke Unknown        family history    Social History   Socioeconomic History  . Marital status: Married    Spouse name: Dustin Kennedy  . Number of children: 2  . Years of education: College  . Highest education level: Not on file  Occupational History  . Occupation: [employer]  Tobacco Use  . Smoking status: Never Smoker  . Smokeless tobacco: Never Used  Substance and Sexual Activity  .  Alcohol use: Yes    Comment: 1-4 drinks per month or less  . Drug use: No  . 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  Other Topics Concern  . Not on file  Social History Narrative   Lives w/ wife   Caffeine use: 2-3 cups coffee per day   Social Determinants of Health   Financial Resource Strain: Not on file  Food Insecurity: Not on file  Transportation Needs: Not on file  Physical Activity: Not on file  Stress: Not on file  Social Connections: Not on file  Intimate Partner Violence: Not on file    Outpatient Medications Prior to Visit  Medication Sig Dispense Refill  . cyclobenzaprine (FLEXERIL) 10 MG tablet TAKE 1 TABLET(10 MG) BY MOUTH THREE TIMES DAILY AS NEEDED FOR MUSCLE SPASMS 60 tablet 1  . eletriptan (RELPAX) 20 MG tablet Take 1 tablet (20 mg total) by mouth as needed for migraine or headache. May repeat in 2 hours if headache persists or recurs. 10 tablet 5  . Multiple Vitamins-Minerals (MULTIVITAMIN ADULT PO)  Take by mouth.    . Omega-3 Fatty Acids (FISH OIL) 1000 MG CAPS Take by mouth.    . Testosterone 20.25 MG/ACT (1.62%) GEL APPLY 2 PUMP to 1 Upper arm AND 2 PUMPS to Opposite upper arm EVERY MORNING. TOTAL OF 4 PUMPS DAILY 150 g 3  . topiramate (TOPAMAX) 50 MG tablet TAKE 1 TABLET(50 MG) BY MOUTH TWICE DAILY 60 tablet 0   No facility-administered medications prior to visit.    Allergies  Allergen Reactions  . Azucena Freed [Testosterone] Rash    Skin irritation    ROS Review of Systems    Objective:    Physical Exam Constitutional:      Appearance: He is well-developed and well-nourished.  HENT:     Head: Normocephalic and atraumatic.     Comments: TMs and canals are clear bilaterally.    Right Ear: Tympanic membrane, ear canal and external ear normal.     Left Ear: Tympanic membrane, ear canal and external ear  normal.  Cardiovascular:     Rate and Rhythm: Normal rate and regular rhythm.     Heart sounds: Normal heart sounds.  Pulmonary:     Effort: Pulmonary effort is normal.     Breath sounds: Normal breath sounds.  Musculoskeletal:     Cervical back: Neck supple.  Lymphadenopathy:     Cervical: No cervical adenopathy.  Skin:    General: Skin is warm and dry.  Neurological:     Mental Status: He is alert and oriented to person, place, and time.  Psychiatric:        Mood and Affect: Mood and affect normal.        Behavior: Behavior normal.     BP (!) 142/66   Pulse 73   Ht 5\' 10"  (1.778 m)   Wt 258 lb (117 kg)   SpO2 97%   BMI 37.02 kg/m  Wt Readings from Last 3 Encounters:  09/22/20 258 lb (117 kg)  03/23/20 274 lb (124.3 kg)  02/16/20 262 lb (118.8 kg)     Health Maintenance Due  Topic Date Due  . HIV Screening  Never done    There are no preventive care reminders to display for this patient.  Lab Results  Component Value Date   TSH 1.60 02/16/2020   Lab Results  Component Value Date   WBC 5.8 02/16/2020   HGB 16.7 02/16/2020   HCT 47.7 02/16/2020   MCV 87.5 02/16/2020   PLT 258 02/16/2020   Lab Results  Component Value Date   NA 141 02/16/2020   K 4.7 02/16/2020   CO2 27 02/16/2020   GLUCOSE 97 02/16/2020   BUN 18 02/16/2020   CREATININE 1.05 02/16/2020   BILITOT 1.1 02/16/2020   ALKPHOS 65 10/01/2016   AST 20 02/16/2020   ALT 22 02/16/2020   PROT 6.4 02/16/2020   ALBUMIN 3.9 10/01/2016   CALCIUM 9.4 02/16/2020   Lab Results  Component Value Date   CHOL 175 06/30/2019   Lab Results  Component Value Date   HDL 51 06/30/2019   Lab Results  Component Value Date   LDLCALC 106 (H) 06/30/2019   Lab Results  Component Value Date   TRIG 89 06/30/2019   Lab Results  Component Value Date   CHOLHDL 3.4 06/30/2019   Lab Results  Component Value Date   HGBA1C 5.3 10/26/2013      Assessment & Plan:   Problem List Items Addressed This  Visit      Respiratory  OSA (obstructive sleep apnea)    Wearing his increased recently it certainly possible that his sleep apnea has worsened.  Did encourage him to consider one of the smart pillows that adjust when you start snoring.  But could also consider repeating the sleep study.        Endocrine   Hypogonadism in male - Primary    Doing well on his current regimen he feels like it is effective he is doing 2 pumps to each upper arm.  Due for repeat labs.  If everything looks great we will just continue for another 6 months and follow-up at that time.  Due for CBC, PSA and testosterone.      Relevant Orders   CBC   Testosterone Total,Free,Bio, Males   COMPLETE METABOLIC PANEL WITH GFR   Lipid panel   PSA     Other   Tinnitus of both ears    Exam is normal and reassuring today.  Unfortunately most tinnitus is not reversible and there are only limited options for treatment which can sometimes improve symptoms but do not eliminate them.  Discussed ENT referral for further work-up just to make sure that were not missing anything he hasn't had any hearing loss which is also reassuring.      Relevant Orders   Ambulatory referral to ENT    Other Visit Diagnoses    Screening for lipoid disorders       Relevant Orders   Lipid panel      First shingles vaccine given today.  No orders of the defined types were placed in this encounter.   Follow-up: Return in about 6 months (around 03/22/2021) for medications and migraines.    Nani Gasser, MD

## 2020-09-22 NOTE — Assessment & Plan Note (Signed)
Exam is normal and reassuring today.  Unfortunately most tinnitus is not reversible and there are only limited options for treatment which can sometimes improve symptoms but do not eliminate them.  Discussed ENT referral for further work-up just to make sure that were not missing anything he hasn't had any hearing loss which is also reassuring.

## 2020-09-22 NOTE — Assessment & Plan Note (Signed)
Wearing his increased recently it certainly possible that his sleep apnea has worsened.  Did encourage him to consider one of the smart pillows that adjust when you start snoring.  But could also consider repeating the sleep study.

## 2020-09-25 ENCOUNTER — Telehealth: Payer: Self-pay | Admitting: *Deleted

## 2020-09-26 ENCOUNTER — Encounter: Payer: Self-pay | Admitting: Family Medicine

## 2020-10-10 NOTE — Telephone Encounter (Signed)
error 

## 2020-10-18 ENCOUNTER — Encounter (INDEPENDENT_AMBULATORY_CARE_PROVIDER_SITE_OTHER): Payer: Self-pay | Admitting: Otolaryngology

## 2020-10-18 ENCOUNTER — Ambulatory Visit (INDEPENDENT_AMBULATORY_CARE_PROVIDER_SITE_OTHER): Payer: BC Managed Care – PPO | Admitting: Otolaryngology

## 2020-10-18 ENCOUNTER — Other Ambulatory Visit: Payer: Self-pay

## 2020-10-18 VITALS — Temp 96.6°F

## 2020-10-18 DIAGNOSIS — H9313 Tinnitus, bilateral: Secondary | ICD-10-CM

## 2020-10-18 DIAGNOSIS — H9042 Sensorineural hearing loss, unilateral, left ear, with unrestricted hearing on the contralateral side: Secondary | ICD-10-CM | POA: Diagnosis not present

## 2020-10-18 NOTE — Progress Notes (Signed)
HPI: Dustin Kennedy is a 55 y.o. male who presents is referred by his PCP for evaluation of tinnitus in both ears that is gotten worse over the past 6 months.  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  Has not noted substantial hearing loss..  Past Medical History:  Diagnosis Date  . Hyperlipidemia   . Hypertension    Past Surgical History:  Procedure Laterality Date  . lipomas removal     removal X 4  . LT shoulder surgery  1997   for bone spurs   Social History   Socioeconomic History  . Marital status: Married    Spouse name: [name]  . Number of children: 2  . Years of education: College  . Highest education level: Not on file  Occupational History  . Occupation: [employer]  Tobacco Use  . Smoking status: Never Smoker  . Smokeless tobacco: Never Used  Substance and Sexual Activity  . Alcohol use: Yes    Comment: 1-4 drinks per month or less  . Drug use: No  . 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  Other Topics Concern  . Not on file  Social History Narrative   Lives w/ wife   Caffeine use: 2-3 cups coffee per day   Social Determinants of Health   Financial Resource Strain: Not on file  Food Insecurity: Not on file  Transportation Needs: Not on file  Physical Activity: Not on file  Stress: Not on file  Social Connections: Not on file   Family History  Problem Relation Age of Onset  . Hypertension Unknown        family history  . Hyperlipidemia Unknown        family history  . Thyroid disease Unknown        family history  . Stroke Unknown        family history   Allergies  Allergen Reactions  . Azucena Freed [Testosterone] Rash    Skin irritation   Prior to Admission medications   Medication Sig Start Date End Date Taking? Authorizing Provider   cyclobenzaprine (FLEXERIL) 10 MG tablet TAKE 1 TABLET(10 MG) BY MOUTH THREE TIMES DAILY AS NEEDED FOR MUSCLE SPASMS 08/28/20   Early, Sung Amabile, NP  eletriptan (RELPAX) 20 MG tablet Take 1 tablet (20 mg total) by mouth as needed for migraine or headache. May repeat in 2 hours if headache persists or recurs. 03/02/20   Agapito Games, MD  Multiple Vitamins-Minerals (MULTIVITAMIN ADULT PO) Take by mouth.    [provider]  Omega-3 Fatty Acids (FISH OIL) 1000 MG CAPS Take by mouth.    [provider]  Testosterone 1.62 % GEL APPLY 2 PUMPS TO 1 UPPER ARM AND 2 PUMPS TO OPPOSITE UPPER ARM EVERY MORNING. TOTAL OF 4 PUMPS DAILY 09/25/20   Agapito Games, MD  topiramate (TOPAMAX) 50 MG tablet TAKE 1 TABLET(50 MG) BY MOUTH TWICE DAILY 09/04/20   Agapito Games, MD     Positive ROS: Otherwise negative  All other systems have been reviewed and were otherwise negative with the exception of those mentioned in the HPI and as above.  Physical Exam: Constitutional: Alert, well-appearing, no acute distress Ears: External ears without lesions or tenderness. Ear canals are clear bilaterally with intact, clear TMs  Nasal: External nose without lesions. Septum with minimal deformity. Clear nasal passages Oral: Lips and gums without lesions. Tongue and palate mucosa without lesions. Posterior oropharynx clear. Neck: No palpable adenopathy or masses Respiratory: Breathing comfortably  Skin: No facial/neck lesions or rash noted.  Audiologic testing demonstrated a slight high-frequency sensorineural hearing loss above 4000 frequency down to 25-30 dB.  SRT's were 10 dB bilaterally.  He had type A tympanograms bilaterally.  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  This is not causing any significant problems with his hearing.  Plan: Reviewed with him concerning limited treatment options for tinnitus.  I  discussed with him concerning using masking noise to help with the tinnitus when it is bothersome.  Also gave him some samples of Lipo flavonoid to try as this is beneficial in some people with tinnitus.  Cautioned him about using ear protection when around loud noise. He will follow-up as needed.   Narda Bonds, MD   CC:

## 2020-10-24 DIAGNOSIS — E291 Testicular hypofunction: Secondary | ICD-10-CM | POA: Diagnosis not present

## 2020-10-24 DIAGNOSIS — Z13220 Encounter for screening for lipoid disorders: Secondary | ICD-10-CM | POA: Diagnosis not present

## 2020-10-25 LAB — COMPLETE METABOLIC PANEL WITH GFR
AG Ratio: 1.9 (calc) (ref 1.0–2.5)
ALT: 20 U/L (ref 9–46)
AST: 20 U/L (ref 10–35)
Albumin: 4.2 g/dL (ref 3.6–5.1)
Alkaline phosphatase (APISO): 75 U/L (ref 35–144)
BUN: 21 mg/dL (ref 7–25)
CO2: 24 mmol/L (ref 20–32)
Calcium: 9.4 mg/dL (ref 8.6–10.3)
Chloride: 108 mmol/L (ref 98–110)
Creat: 0.96 mg/dL (ref 0.70–1.33)
GFR, Est African American: 103 mL/min/{1.73_m2} (ref >=60)
GFR, Est Non African American: 89 mL/min/{1.73_m2} (ref >=60)
Globulin: 2.2 g/dL (calc) (ref 1.9–3.7)
Glucose, Bld: 109 mg/dL (ref 65–139)
Potassium: 4 mmol/L (ref 3.5–5.3)
Sodium: 140 mmol/L (ref 135–146)
Total Bilirubin: 0.7 mg/dL (ref 0.2–1.2)
Total Protein: 6.4 g/dL (ref 6.1–8.1)

## 2020-10-25 LAB — CBC
HCT: 46.2 % (ref 38.5–50.0)
Hemoglobin: 16.1 g/dL (ref 13.2–17.1)
MCH: 30.7 pg (ref 27.0–33.0)
MCHC: 34.8 g/dL (ref 32.0–36.0)
MCV: 88 fL (ref 80.0–100.0)
MPV: 10 fL (ref 7.5–12.5)
Platelets: 265 10*3/uL (ref 140–400)
RBC: 5.25 10*6/uL (ref 4.20–5.80)
RDW: 12.5 % (ref 11.0–15.0)
WBC: 7.3 10*3/uL (ref 3.8–10.8)

## 2020-10-25 LAB — LIPID PANEL
Cholesterol: 158 mg/dL (ref <200)
HDL: 44 mg/dL (ref >=40)
LDL Cholesterol (Calc): 86 mg/dL (calc)
Non-HDL Cholesterol (Calc): 114 mg/dL (calc) (ref <130)
Total CHOL/HDL Ratio: 3.6 (calc) (ref <5.0)
Triglycerides: 182 mg/dL — ABNORMAL HIGH (ref <150)

## 2020-10-25 LAB — TESTOSTERONE TOTAL,FREE,BIO, MALES
Albumin: 4.2 g/dL (ref 3.6–5.1)
Sex Hormone Binding: 30 nmol/L (ref 10–50)
Testosterone, Bioavailable: 140.6 ng/dL (ref >=110.0)
Testosterone, Free: 73 pg/mL (ref 46.0–224.0)
Testosterone: 490 ng/dL (ref 250–827)

## 2020-10-25 LAB — PSA: PSA: 0.73 ng/mL (ref <=4.0)

## 2020-10-30 ENCOUNTER — Other Ambulatory Visit: Payer: Self-pay | Admitting: *Deleted

## 2020-10-30 MED ORDER — TOPIRAMATE 50 MG PO TABS: ORAL_TABLET | ORAL | 0 refills | Status: DC

## 2020-11-16 ENCOUNTER — Encounter (INDEPENDENT_AMBULATORY_CARE_PROVIDER_SITE_OTHER): Payer: Self-pay

## 2020-12-07 ENCOUNTER — Other Ambulatory Visit: Payer: Self-pay | Admitting: Family Medicine

## 2021-02-03 ENCOUNTER — Other Ambulatory Visit: Payer: Self-pay | Admitting: Nurse Practitioner

## 2021-02-03 DIAGNOSIS — M542 Cervicalgia: Secondary | ICD-10-CM

## 2021-02-03 DIAGNOSIS — G8929 Other chronic pain: Secondary | ICD-10-CM

## 2021-02-03 DIAGNOSIS — R519 Headache, unspecified: Secondary | ICD-10-CM

## 2021-02-16 ENCOUNTER — Other Ambulatory Visit: Payer: Self-pay | Admitting: *Deleted

## 2021-03-11 ENCOUNTER — Other Ambulatory Visit: Payer: Self-pay | Admitting: Family Medicine

## 2021-03-12 NOTE — Telephone Encounter (Signed)
Made an appt for 03/30/2021 @ 12:50- tvt

## 2021-03-12 NOTE — Telephone Encounter (Signed)
Please call pt and advise him that he will need to schedule a f/u for migraine

## 2021-03-30 ENCOUNTER — Encounter: Payer: Self-pay | Admitting: Family Medicine

## 2021-03-30 ENCOUNTER — Ambulatory Visit: Payer: BC Managed Care – PPO | Admitting: Family Medicine

## 2021-03-30 ENCOUNTER — Other Ambulatory Visit: Payer: Self-pay

## 2021-03-30 VITALS — BP 135/81 | HR 76 | Temp 98.5°F | Ht 70.0 in | Wt 259.0 lb

## 2021-03-30 DIAGNOSIS — Z23 Encounter for immunization: Secondary | ICD-10-CM

## 2021-03-30 DIAGNOSIS — J019 Acute sinusitis, unspecified: Secondary | ICD-10-CM | POA: Diagnosis not present

## 2021-03-30 DIAGNOSIS — E291 Testicular hypofunction: Secondary | ICD-10-CM | POA: Diagnosis not present

## 2021-03-30 DIAGNOSIS — G43009 Migraine without aura, not intractable, without status migrainosus: Secondary | ICD-10-CM | POA: Diagnosis not present

## 2021-03-30 MED ORDER — AMOXICILLIN 875 MG PO TABS: 875.0000 mg | ORAL_TABLET | Freq: Two times a day (BID) | ORAL | 0 refills | Status: AC

## 2021-03-30 MED ORDER — TOPIRAMATE 25 MG PO TABS: ORAL_TABLET | ORAL | 0 refills | Status: DC

## 2021-03-30 NOTE — Assessment & Plan Note (Signed)
Really well controlled on Topamax he has been on the Topamax for a little over a year at this point so we did discuss an option of tapering off when he completes his current bottle of medications and get a go ahead and send over prescription for a taper that he can start this fall we can always go back up if needed.

## 2021-03-30 NOTE — Assessment & Plan Note (Signed)
Doing well with his testosterone.  We did discuss that he can go to yearly labs for his testosterone replacement.  He is currently up-to-date and doing well with it and happy with his current regimen and he feels like it is effective for symptom control.

## 2021-03-30 NOTE — Progress Notes (Signed)
Established Patient Office Visit  Subjective:  Patient ID: Dustin Kennedy, male    DOB: November 24, 1965  Age: 55 y.o. MRN: [id number]  CC:  Chief Complaint  Patient presents with   Follow-up    HPI Dustin Kennedy presents for f/u Migaine Headaches.  He is currently taking 50 mg of Topamax twice a day and says has not had to use the rescue medication recently.  Is actually been working really well.  Sometimes will still get some neck pain which can trigger the headaches but is usually able to get it under control.  F/u hypogonadism -currently on topical testosterone 1.60% gel.  Last PSA and hemoglobin today.     Reports a head cold x 1 week. 3 neg COVID tests. No fever.  Complains mostly of drainage with an occasional cough once the drainage sort of builds up.  No fevers chills or sweats.  Would like to get his 2nd Shingles vaccine.   Past Medical History:  Diagnosis Date   Hyperlipidemia    Hypertension     Past Surgical History:  Procedure Laterality Date   lipomas removal     removal X 4   LT shoulder surgery  1997   for bone spurs    Family History  Problem Relation Age of Onset   Hypertension Unknown        family history   Hyperlipidemia Unknown        family history   Thyroid disease Unknown        family history   Stroke Unknown        family history    Social History   Socioeconomic History   Marital status: Married    Spouse name: [name]   Number of children: 2   Years of education: College   Highest education level: Not on file  Occupational History   Occupation: [employer]  Tobacco Use   Smoking status: Never   Smokeless tobacco: Never  Substance and Sexual Activity   Alcohol use: Yes    Comment: 1-4 drinks per month or less   Drug use: No   Sexual activity: Not on file    Comment: 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  Other Topics Concern   Not  on file  Social History Narrative   Lives w/ wife   Caffeine use: 2-3 cups coffee per day   Social Determinants of Health   Financial Resource Strain: Not on file  Food Insecurity: Not on file  Transportation Needs: Not on file  Physical Activity: Not on file  Stress: Not on file  Social Connections: Not on file  Intimate Partner Violence: Not on file    Outpatient Medications Prior to Visit  Medication Sig Dispense Refill   cyclobenzaprine (FLEXERIL) 10 MG tablet TAKE 1 TABLET(10 MG) BY MOUTH THREE TIMES DAILY AS NEEDED FOR MUSCLE SPASMS 60 tablet 1   eletriptan (RELPAX) 20 MG tablet Take 1 tablet (20 mg total) by mouth as needed for migraine or headache. May repeat in 2 hours if headache persists or recurs. 10 tablet 5   Multiple Vitamins-Minerals (MULTIVITAMIN ADULT PO) Take by mouth.     Omega-3 Fatty Acids (FISH OIL) 1000 MG CAPS Take by mouth.     Testosterone 1.62 % GEL APPLY 2 PUMPS TO 1 UPPER ARM AND 2 PUMPS TO OPPOSITE UPPER ARM EVERY MORNING. TOTAL OF 4 PUMPS DAILY 150 g  2   topiramate (TOPAMAX) 50 MG tablet TAKE 1 TABLET(50 MG) BY MOUTH TWICE DAILY 180 tablet 0   No facility-administered medications prior to visit.    Allergies  Allergen Reactions   Azucena Freed [Testosterone] Rash    Skin irritation    ROS Review of Systems    Objective:    Physical Exam Constitutional:      Appearance: He is well-developed.  HENT:     Head: Normocephalic and atraumatic.     Right Ear: Tympanic membrane, ear canal and external ear normal.     Left Ear: Tympanic membrane, ear canal and external ear normal.     Nose: Nose normal.     Mouth/Throat:     Mouth: Mucous membranes are moist.     Pharynx: Oropharynx is clear. No posterior oropharyngeal erythema.  Eyes:     Conjunctiva/sclera: Conjunctivae normal.     Pupils: Pupils are equal, round, and reactive to light.  Neck:     Thyroid: No thyromegaly.  Cardiovascular:     Rate and Rhythm: Normal rate.     Heart sounds:  Normal heart sounds.  Pulmonary:     Effort: Pulmonary effort is normal.     Breath sounds: Normal breath sounds.  Musculoskeletal:     Cervical back: Neck supple.  Lymphadenopathy:     Cervical: No cervical adenopathy.  Skin:    General: Skin is warm and dry.  Neurological:     Mental Status: He is alert and oriented to person, place, and time.  Psychiatric:        Mood and Affect: Mood normal.    BP 135/81   Pulse 76   Temp 98.5 F (36.9 C)   Ht 5\' 10"  (1.778 m)   Wt 259 lb (117.5 kg)   SpO2 96%   BMI 37.16 kg/m  Wt Readings from Last 3 Encounters:  03/30/21 259 lb (117.5 kg)  09/22/20 258 lb (117 kg)  03/23/20 274 lb (124.3 kg)     Health Maintenance Due  Topic Date Due   HIV Screening  Never done    There are no preventive care reminders to display for this patient.  Lab Results  Component Value Date   TSH 1.60 02/16/2020   Lab Results  Component Value Date   WBC 7.3 10/24/2020   HGB 16.1 10/24/2020   HCT 46.2 10/24/2020   MCV 88.0 10/24/2020   PLT 265 10/24/2020   Lab Results  Component Value Date   NA 140 10/24/2020   K 4.0 10/24/2020   CO2 24 10/24/2020   GLUCOSE 109 10/24/2020   BUN 21 10/24/2020   CREATININE 0.96 10/24/2020   BILITOT 0.7 10/24/2020   ALKPHOS 65 10/01/2016   AST 20 10/24/2020   ALT 20 10/24/2020   PROT 6.4 10/24/2020   ALBUMIN 3.9 10/01/2016   CALCIUM 9.4 10/24/2020   Lab Results  Component Value Date   CHOL 158 10/24/2020   Lab Results  Component Value Date   HDL 44 10/24/2020   Lab Results  Component Value Date   LDLCALC 86 10/24/2020   Lab Results  Component Value Date   TRIG 182 (H) 10/24/2020   Lab Results  Component Value Date   CHOLHDL 3.6 10/24/2020   Lab Results  Component Value Date   HGBA1C 5.3 10/26/2013      Assessment & Plan:   Problem List Items Addressed This Visit       Cardiovascular and Mediastinum   Migraine headache - Primary  Really well controlled on Topamax he has  been on the Topamax for a little over a year at this point so we did discuss an option of tapering off when he completes his current bottle of medications and get a go ahead and send over prescription for a taper that he can start this fall we can always go back up if needed.      Relevant Medications   topiramate (TOPAMAX) 25 MG tablet     Endocrine   Hypogonadism in male    Doing well with his testosterone.  We did discuss that he can go to yearly labs for his testosterone replacement.  He is currently up-to-date and doing well with it and happy with his current regimen and he feels like it is effective for symptom control.      Other Visit Diagnoses     Need for Zostavax administration       Relevant Orders   Varicella-zoster vaccine IM (Shingrix) (Completed)   Need for immunization against influenza       Relevant Orders   Flu Vaccine QUAD 22mo+IM (Fluarix, Fluzone & Alfiuria Quad PF) (Completed)   Acute non-recurrent sinusitis, unspecified location       Relevant Medications   amoxicillin (AMOXIL) 875 MG tablet      Acute sinusitis.  We did discuss that if he is not getting better over the weekend okay to fill the prescription for the amoxicillin.  I really would encourage him to give it a couple more days before he fills it.  If he suddenly gets worse then he can always fill it sooner.  Meds ordered this encounter  Medications   amoxicillin (AMOXIL) 875 MG tablet    Sig: Take 1 tablet (875 mg total) by mouth 2 (two) times daily for 10 days.    Dispense:  14 tablet    Refill:  0   topiramate (TOPAMAX) 25 MG tablet    Sig: Take 1 tablet (25 mg total) by mouth 2 (two) times daily for 30 days, THEN 1 tablet (25 mg total) at bedtime.    Dispense:  90 tablet    Refill:  0    Follow-up: Return in about 6 months (around 09/27/2021) for testosterone and labs, migraines.    Nani Gasser, MD

## 2021-05-02 ENCOUNTER — Encounter: Payer: Self-pay | Admitting: Family Medicine

## 2021-05-02 ENCOUNTER — Emergency Department
Admission: RE | Admit: 2021-05-02 | Discharge: 2021-05-02 | Disposition: A | Discharge status: Home / Self Care | Payer: BC Managed Care – PPO | Source: Ambulatory Visit | Attending: Family Medicine | Admitting: Family Medicine

## 2021-05-02 VITALS — BP 137/88 | HR 74 | Temp 99.1°F | Resp 18

## 2021-05-02 DIAGNOSIS — R609 Edema, unspecified: Secondary | ICD-10-CM

## 2021-05-02 DIAGNOSIS — K115 Sialolithiasis: Secondary | ICD-10-CM

## 2021-05-02 MED ORDER — IBUPROFEN 800 MG PO TABS: 800.0000 mg | ORAL_TABLET | Freq: Three times a day (TID) | ORAL | 0 refills | Status: DC

## 2021-05-02 MED ORDER — CEPHALEXIN 500 MG PO CAPS: 500.0000 mg | ORAL_CAPSULE | Freq: Two times a day (BID) | ORAL | 0 refills | Status: DC

## 2021-05-02 NOTE — ED Provider Notes (Signed)
[facility]    CSN: [id number] Arrival date & time: 05/02/21  1656      History   Chief Complaint Chief Complaint  Patient presents with   Appointment    5:00   Facial Swelling    HPI Dustin Kennedy is a 55 y.o. male.   HPI Pain and swelling right-sided face is gotten progressively worse as the day has progressed.  No fever or chills.  No cough or cold runny nose.  No dental problems or dental pain.  No ear pain.  No sore throat Past Medical History:  Diagnosis Date   Hyperlipidemia    Hypertension     Patient Active Problem List   Diagnosis Date Noted   Tinnitus of both ears 09/22/2020   Chronic neck pain 03/23/2020   Migraine headache 03/23/2020   Foraminal stenosis of cervical region 02/29/2020   OSA (obstructive sleep apnea) 02/16/2020   Memory difficulties 02/16/2020   Sebaceous cyst back of neck 06/23/2017   Osteoarthritis of left acromioclavicular joint 08/12/2012   Obesity (BMI 30-39.9) 07/31/2012   Rotator cuff syndrome of left shoulder 07/31/2012   Venous insufficiency (chronic) (peripheral) 04/20/2012   Hypogonadism in male 02/05/2010   WEIGHT GAIN 10/20/2009    Past Surgical History:  Procedure Laterality Date   lipomas removal     removal X 4   LT shoulder surgery  1997   for bone spurs       Home Medications    Prior to Admission medications   Medication Sig Start Date End Date Taking? Authorizing Provider  cephALEXin (KEFLEX) 500 MG capsule Take 1 capsule (500 mg total) by mouth 2 (two) times daily. 05/02/21  Yes Eustace Moore, MD  cyclobenzaprine (FLEXERIL) 10 MG tablet TAKE 1 TABLET(10 MG) BY MOUTH THREE TIMES DAILY AS NEEDED FOR MUSCLE SPASMS 02/16/21  Yes Agapito Games, MD  eletriptan (RELPAX) 20 MG tablet Take 1 tablet (20 mg total) by mouth as needed for migraine or headache. May repeat in 2 hours if headache persists or recurs. 03/02/20  Yes Agapito Games, MD  ibuprofen (ADVIL) 800 MG tablet Take 1  tablet (800 mg total) by mouth 3 (three) times daily. 05/02/21  Yes Eustace Moore, MD  Multiple Vitamins-Minerals (MULTIVITAMIN ADULT PO) Take by mouth.   Yes [provider]  Omega-3 Fatty Acids (FISH OIL) 1000 MG CAPS Take by mouth.   Yes [provider]  Testosterone 1.62 % GEL APPLY 2 PUMPS TO 1 UPPER ARM AND 2 PUMPS TO OPPOSITE UPPER ARM EVERY MORNING. TOTAL OF 4 PUMPS DAILY 09/25/20  Yes Agapito Games, MD  topiramate (TOPAMAX) 25 MG tablet Take 1 tablet (25 mg total) by mouth 2 (two) times daily for 30 days, THEN 1 tablet (25 mg total) at bedtime. 03/30/21 05/29/21 Yes Agapito Games, MD    Family History Family History  Problem Relation Age of Onset   Hypertension Unknown        family history   Hyperlipidemia Unknown        family history   Thyroid disease Unknown        family history   Stroke Unknown        family history    Social History Social History   Tobacco Use   Smoking status: Never   Smokeless tobacco: Never  Substance Use Topics   Alcohol use: Yes    Comment: 1-4 drinks per month or less   Drug use: No  Allergies   Azucena Freed [testosterone]   Review of Systems Review of Systems See HPI  Physical Exam Triage Vital Signs ED Triage Vitals  Enc Vitals Group     BP 05/02/21 1714 137/88     Pulse Rate 05/02/21 1714 74     Resp 05/02/21 1714 18     Temp 05/02/21 1714 99.1 F (37.3 C)     Temp Source 05/02/21 1714 Oral     SpO2 05/02/21 1714 95 %     Weight --      Height --      Head Circumference --      Peak Flow --      Pain Score 05/02/21 1713 0     Pain Loc --      Pain Edu? --      Excl. in GC? --    No data found.  Updated Vital Signs BP 137/88 (BP Location: Right Arm)   Pulse 74   Temp 99.1 F (37.3 C) (Oral)   Resp 18   SpO2 95%      Physical Exam Constitutional:      General: He is in acute distress.     Appearance: Normal appearance. He is well-developed.     Comments: Appears  uncomfortable  HENT:     Head: Normocephalic and atraumatic.     Salivary Glands: Right salivary gland is diffusely enlarged and tender.      Mouth/Throat:     Mouth: Mucous membranes are moist.     Pharynx: No posterior oropharyngeal erythema.  Eyes:     Conjunctiva/sclera: Conjunctivae normal.     Pupils: Pupils are equal, round, and reactive to light.  Cardiovascular:     Rate and Rhythm: Normal rate.  Pulmonary:     Effort: Pulmonary effort is normal. No respiratory distress.  Abdominal:     General: There is no distension.     Palpations: Abdomen is soft.  Musculoskeletal:        General: Normal range of motion.     Cervical back: Normal range of motion.  Skin:    General: Skin is warm and dry.  Neurological:     Mental Status: He is alert.  Psychiatric:        Mood and Affect: Mood normal.        Behavior: Behavior normal.     UC Treatments / Results  Labs (all labs ordered are listed, but only abnormal results are displayed) Labs Reviewed - No data to display  EKG   Radiology No results found.  Procedures Procedures (including critical care time)  Medications Ordered in UC Medications - No data to display  Initial Impression / Assessment and Plan / UC Course  I have reviewed the triage vital signs and the nursing notes.  Pertinent labs & imaging results that were available during my care of the patient were reviewed by me and considered in my medical decision making (see chart for details).    Patient has swelling of the right parotid, isolated.  Very tender.  Slight erythema of skin.  Concern for infection versus stone and blockage.  We will Give ibuprofen for pain.   Recommend sour candy.  Follow-up with primary care  Final Clinical Impressions(s) / UC Diagnoses   Final diagnoses:  Parotid swelling  Salivary duct stone     Discharge Instructions      Take Keflex 2 times a day.  I have given you a 5-day supply, you may stop it early  if your  symptoms resolve Take ibuprofen 3 times a day for pain.  Take with food Make sure you continue to drink lots of water Get a sour candy like lemon head Call if not improving over the next day or so, so we can get you in with an ear nose and throat specialist   ED Prescriptions     Medication Sig Dispense Auth. Provider   cephALEXin (KEFLEX) 500 MG capsule Take 1 capsule (500 mg total) by mouth 2 (two) times daily. 10 capsule Eustace Moore, MD   ibuprofen (ADVIL) 800 MG tablet Take 1 tablet (800 mg total) by mouth 3 (three) times daily. 21 tablet Eustace Moore, MD      PDMP not reviewed this encounter.   Eustace Moore, MD 05/02/21 [phone]

## 2021-05-02 NOTE — Discharge Instructions (Addendum)
Take Keflex 2 times a day.  I have given you a 5-day supply, you may stop it early if your symptoms resolve Take ibuprofen 3 times a day for pain.  Take with food Make sure you continue to drink lots of water Get a sour candy like lemon head Call if not improving over the next day or so, so we can get you in with an ear nose and throat specialist

## 2021-05-02 NOTE — ED Triage Notes (Signed)
Patient presents to Urgent Care with complaints of facial swelling since 4-5 hours ago. Patient reports swelling around the right area of the jawbone. Does have some pain with opening his mouth. Has not tried any medication. Denies any sob or throat swelling

## 2021-05-26 ENCOUNTER — Other Ambulatory Visit: Payer: Self-pay | Admitting: Family Medicine

## 2021-05-29 ENCOUNTER — Encounter: Payer: Self-pay | Admitting: Family Medicine

## 2021-07-09 ENCOUNTER — Other Ambulatory Visit: Payer: Self-pay | Admitting: Family Medicine

## 2021-07-09 DIAGNOSIS — R519 Headache, unspecified: Secondary | ICD-10-CM

## 2021-07-09 DIAGNOSIS — G8929 Other chronic pain: Secondary | ICD-10-CM

## 2021-08-16 ENCOUNTER — Other Ambulatory Visit: Payer: Self-pay | Admitting: Neurology

## 2021-08-16 MED ORDER — TOPIRAMATE 25 MG PO TABS: 25.0000 mg | ORAL_TABLET | Freq: Every day | ORAL | 0 refills | Status: DC

## 2021-08-16 NOTE — Telephone Encounter (Signed)
Last note from September states patient should be tapering Topamax  topiramate (TOPAMAX) 25 MG tablet      Sig: Take 1 tablet (25 mg total) by mouth 2 (two) times daily for 30 days, THEN 1 tablet (25 mg total) at bedtime.      Dispense:  90 tablet      Refill:  0   Next appt stated 6 months. Okay to continue Topamax 25 mg until March?

## 2021-10-17 ENCOUNTER — Other Ambulatory Visit: Payer: Self-pay | Admitting: Family Medicine

## 2021-10-24 ENCOUNTER — Other Ambulatory Visit: Payer: Self-pay | Admitting: Family Medicine

## 2021-10-25 MED ORDER — TOPIRAMATE 25 MG PO TABS: 25.0000 mg | ORAL_TABLET | Freq: Every day | ORAL | 0 refills | Status: DC

## 2021-10-29 ENCOUNTER — Encounter: Payer: Self-pay | Admitting: Family Medicine

## 2021-10-30 ENCOUNTER — Other Ambulatory Visit: Payer: Self-pay | Admitting: *Deleted

## 2021-10-30 MED ORDER — TOPIRAMATE 50 MG PO TABS
50.0000 mg | ORAL_TABLET | Freq: Two times a day (BID) | ORAL | 1 refills | Status: DC
Start: 2021-10-30 — End: 2021-10-31

## 2021-10-31 ENCOUNTER — Other Ambulatory Visit: Payer: Self-pay | Admitting: *Deleted

## 2021-10-31 MED ORDER — TOPIRAMATE 25 MG PO TABS: 25.0000 mg | ORAL_TABLET | Freq: Two times a day (BID) | ORAL | 1 refills | Status: DC

## 2021-11-04 ENCOUNTER — Other Ambulatory Visit: Payer: Self-pay | Admitting: Family Medicine

## 2021-11-04 DIAGNOSIS — G8929 Other chronic pain: Secondary | ICD-10-CM

## 2021-11-04 DIAGNOSIS — R519 Headache, unspecified: Secondary | ICD-10-CM

## 2021-11-07 ENCOUNTER — Other Ambulatory Visit: Payer: Self-pay

## 2021-11-07 NOTE — Telephone Encounter (Signed)
LVM for patient to call back to get appt scheduled. AM 

## 2021-11-07 NOTE — Telephone Encounter (Signed)
Needs appointment.  He was supposed to follow-up around 1 April which was a month ago I do not see any appointments pending.  Once he has appointment then I can refill meds until appointment ?

## 2021-11-13 MED ORDER — CYCLOBENZAPRINE HCL 10 MG PO TABS: 10.0000 mg | ORAL_TABLET | Freq: Two times a day (BID) | ORAL | 0 refills | Status: DC | PRN

## 2021-11-13 MED ORDER — TESTOSTERONE 1.62 % TD GEL: TRANSDERMAL | 0 refills | Status: DC

## 2021-11-13 NOTE — Telephone Encounter (Signed)
Meds ordered this encounter  ?Medications  ? Testosterone 1.62 % GEL  ?  Sig: APPLY 2 PUMPS TO 1 UPPER ARM AND 2 PUMPS TO OPPOSITE ARM EVERY MORNING.TOTAL 4 PUMPS DAILY  ?  Dispense:  150 g  ?  Refill:  0  ? cyclobenzaprine (FLEXERIL) 10 MG tablet  ?  Sig: Take 1 tablet (10 mg total) by mouth 2 (two) times daily as needed for muscle spasms.  ?  Dispense:  60 tablet  ?  Refill:  0  ? ? ?

## 2021-12-13 ENCOUNTER — Encounter: Payer: Self-pay | Admitting: Family Medicine

## 2021-12-13 ENCOUNTER — Ambulatory Visit: Payer: BC Managed Care – PPO | Admitting: Family Medicine

## 2021-12-13 VITALS — BP 129/64 | HR 63 | Ht 70.0 in | Wt 246.0 lb

## 2021-12-13 DIAGNOSIS — E291 Testicular hypofunction: Secondary | ICD-10-CM | POA: Diagnosis not present

## 2021-12-13 DIAGNOSIS — M542 Cervicalgia: Secondary | ICD-10-CM

## 2021-12-13 DIAGNOSIS — G43009 Migraine without aura, not intractable, without status migrainosus: Secondary | ICD-10-CM

## 2021-12-13 DIAGNOSIS — Z13220 Encounter for screening for lipoid disorders: Secondary | ICD-10-CM

## 2021-12-13 DIAGNOSIS — M4802 Spinal stenosis, cervical region: Secondary | ICD-10-CM

## 2021-12-13 DIAGNOSIS — G8929 Other chronic pain: Secondary | ICD-10-CM

## 2021-12-13 DIAGNOSIS — R519 Headache, unspecified: Secondary | ICD-10-CM

## 2021-12-13 MED ORDER — TOPIRAMATE 25 MG PO TABS
25.0000 mg | ORAL_TABLET | Freq: Two times a day (BID) | ORAL | 1 refills | Status: DC
Start: 2021-12-13 — End: 2022-06-06

## 2021-12-13 MED ORDER — TESTOSTERONE 1.62 % TD GEL: TRANSDERMAL | 1 refills | Status: DC

## 2021-12-13 MED ORDER — ELETRIPTAN HYDROBROMIDE 20 MG PO TABS: 20.0000 mg | ORAL_TABLET | ORAL | 5 refills | Status: DC | PRN

## 2021-12-13 NOTE — Assessment & Plan Note (Signed)
Due to recheck labs just to make sure that everything looks good and PSA is normal.  And CBC is within the normal range.

## 2021-12-13 NOTE — Assessment & Plan Note (Signed)
Discontinue Flexeril.

## 2021-12-13 NOTE — Progress Notes (Signed)
Established Patient Office Visit  Subjective   Patient ID: Dustin Kennedy, male    DOB: 07-Mar-1966  Age: 56 y.o. MRN: [id number]  Chief Complaint  Patient presents with   Follow-up    HPI  Follow-up for hypogonadism- Doing well on current regimen. He is due for labs.    Follow-up for chronic neck pain-he previously was using the Flexeril often times to help him get comfortable to go to sleep.  He had actually stopped it for a while and then restarted it and noticed that he gained weight.  To stop that again and the weight came back off.  He wanted to know if that was a typical side effect but says right now he is not interested in taking it anymore.  He has been mostly relying on melatonin for sleep.  Migraine headaches-he is actually been doing really well on the Topamax 25 mg twice a day.  He says he did try coming down off the medication but unfortunately the headaches came back and so he has restarted it and says that really seems to be making a big difference.  He is really doing fantastic he is down about 20 pounds he has been using the Noom diet he is done it before in the past but is back on that.  Also scheduled for consultation June 1 for a lid lift on his left eye is significantly interfering with his vision and affecting more his contacts its in his eye.    ROS    Objective:     BP 129/64   Pulse 63   Ht 5\' 10"  (1.778 m)   Wt 246 lb (111.6 kg)   SpO2 97%   BMI 35.30 kg/m    Physical Exam Constitutional:      Appearance: He is well-developed.  HENT:     Head: Normocephalic and atraumatic.  Cardiovascular:     Rate and Rhythm: Normal rate and regular rhythm.     Heart sounds: Normal heart sounds.  Pulmonary:     Effort: Pulmonary effort is normal.     Breath sounds: Normal breath sounds.  Skin:    General: Skin is warm and dry.  Neurological:     Mental Status: He is alert and oriented to person, place, and time.  Psychiatric:        Behavior: Behavior  normal.     No results found for any visits on 12/13/21.    The 10-year ASCVD risk score (Arnett DK, et al., 2019) is: 5.3%    Assessment & Plan:   Problem List Items Addressed This Visit       Cardiovascular and Mediastinum   Migraine headache    Doing well on the Topamax.  We will make sure refill sent to the pharmacy.  Follow-up in 6 months.       Relevant Medications   eletriptan (RELPAX) 20 MG tablet   topiramate (TOPAMAX) 25 MG tablet     Endocrine   Hypogonadism in male    Due to recheck labs just to make sure that everything looks good and PSA is normal.  And CBC is within the normal range.       Relevant Medications   Testosterone 1.62 % GEL   Other Relevant Orders   Lipid Panel w/reflex Direct LDL   COMPLETE METABOLIC PANEL WITH GFR   CBC   Testosterone Total,Free,Bio, Males   PSA     Musculoskeletal and Integument   Foraminal stenosis of cervical region  Other   Chronic neck pain - Primary    Discontinue Flexeril.       Relevant Medications   topiramate (TOPAMAX) 25 MG tablet   Other Visit Diagnoses     Screening, lipid       Relevant Orders   Lipid Panel w/reflex Direct LDL   Chronic nonintractable headache, unspecified headache type       Relevant Medications   eletriptan (RELPAX) 20 MG tablet   topiramate (TOPAMAX) 25 MG tablet       Return in about 6 months (around 06/15/2022) for tsstosterone and migraines.    Beatrice Lecher, MD

## 2021-12-13 NOTE — Assessment & Plan Note (Signed)
Doing well on the Topamax.  We will make sure refill sent to the pharmacy.  Follow-up in 6 months.

## 2021-12-14 LAB — COMPLETE METABOLIC PANEL WITH GFR
AG Ratio: 1.8 (calc) (ref 1.0–2.5)
ALT: 23 U/L (ref 9–46)
AST: 23 U/L (ref 10–35)
Albumin: 4.3 g/dL (ref 3.6–5.1)
Alkaline phosphatase (APISO): 77 U/L (ref 35–144)
BUN: 18 mg/dL (ref 7–25)
CO2: 25 mmol/L (ref 20–32)
Calcium: 9.4 mg/dL (ref 8.6–10.3)
Chloride: 108 mmol/L (ref 98–110)
Creat: 0.93 mg/dL (ref 0.70–1.30)
Globulin: 2.4 g/dL (calc) (ref 1.9–3.7)
Glucose, Bld: 101 mg/dL — ABNORMAL HIGH (ref 65–99)
Potassium: 4.7 mmol/L (ref 3.5–5.3)
Sodium: 141 mmol/L (ref 135–146)
Total Bilirubin: 0.8 mg/dL (ref 0.2–1.2)
Total Protein: 6.7 g/dL (ref 6.1–8.1)
eGFR: 96 mL/min/{1.73_m2} (ref >=60)

## 2021-12-14 LAB — TESTOSTERONE TOTAL,FREE,BIO, MALES
Albumin: 4.3 g/dL (ref 3.6–5.1)
Sex Hormone Binding: 36 nmol/L (ref 22–77)
Testosterone, Bioavailable: 219.1 ng/dL (ref 110.0–575.0)
Testosterone, Free: 111.2 pg/mL (ref 46.0–224.0)
Testosterone: 797 ng/dL (ref 250–827)

## 2021-12-14 LAB — LIPID PANEL W/REFLEX DIRECT LDL
Cholesterol: 179 mg/dL (ref <200)
HDL: 49 mg/dL (ref >=40)
LDL Cholesterol (Calc): 109 mg/dL (calc) — ABNORMAL HIGH
Non-HDL Cholesterol (Calc): 130 mg/dL (calc) — ABNORMAL HIGH (ref <130)
Total CHOL/HDL Ratio: 3.7 (calc) (ref <5.0)
Triglycerides: 103 mg/dL (ref <150)

## 2021-12-14 LAB — CBC
HCT: 48.5 % (ref 38.5–50.0)
Hemoglobin: 16.3 g/dL (ref 13.2–17.1)
MCH: 30.2 pg (ref 27.0–33.0)
MCHC: 33.6 g/dL (ref 32.0–36.0)
MCV: 90 fL (ref 80.0–100.0)
MPV: 10.3 fL (ref 7.5–12.5)
Platelets: 260 10*3/uL (ref 140–400)
RBC: 5.39 10*6/uL (ref 4.20–5.80)
RDW: 12.7 % (ref 11.0–15.0)
WBC: 6 10*3/uL (ref 3.8–10.8)

## 2021-12-14 LAB — PSA: PSA: 0.97 ng/mL (ref <=4.00)

## 2021-12-14 NOTE — Progress Notes (Signed)
Hi Dustin Kennedy,  LDL cholesterol up just a little bit.  It looked better last year.  But not in a worrisome range.  Just keep up the good changes that you are making as well as regular exercise.  Blood count and metabolic panel look normal.  Testosterone levels look great.  Prostate test is normal.

## 2021-12-21 ENCOUNTER — Telehealth: Payer: Self-pay

## 2021-12-21 NOTE — Telephone Encounter (Signed)
Initiated Prior authorization JOA:[id number] (RELPAX) 20 MG tablet Via: Covermymeds Case/Key:[id number] Status: denied as of 12/21/21 Reason:awaiting denial letter from insurance, no indication  Notified Pt via: Mychart

## 2021-12-27 DIAGNOSIS — H02403 Unspecified ptosis of bilateral eyelids: Secondary | ICD-10-CM | POA: Diagnosis not present

## 2022-02-27 DIAGNOSIS — H02403 Unspecified ptosis of bilateral eyelids: Secondary | ICD-10-CM | POA: Diagnosis not present

## 2022-02-27 DIAGNOSIS — H02834 Dermatochalasis of left upper eyelid: Secondary | ICD-10-CM | POA: Diagnosis not present

## 2022-02-27 DIAGNOSIS — H02831 Dermatochalasis of right upper eyelid: Secondary | ICD-10-CM | POA: Diagnosis not present

## 2022-05-24 ENCOUNTER — Ambulatory Visit (INDEPENDENT_AMBULATORY_CARE_PROVIDER_SITE_OTHER): Payer: BC Managed Care – PPO | Admitting: Family Medicine

## 2022-05-24 DIAGNOSIS — Z23 Encounter for immunization: Secondary | ICD-10-CM

## 2022-05-24 NOTE — Progress Notes (Signed)
Flu vaccine given.  Unfortunately we ran out of COVID-vaccine so they are supposed to come in today.

## 2022-06-06 ENCOUNTER — Other Ambulatory Visit: Payer: Self-pay | Admitting: Family Medicine

## 2022-06-24 ENCOUNTER — Encounter: Payer: Self-pay | Admitting: Family Medicine

## 2022-07-13 ENCOUNTER — Other Ambulatory Visit: Payer: Self-pay | Admitting: Family Medicine

## 2022-07-13 DIAGNOSIS — E291 Testicular hypofunction: Secondary | ICD-10-CM

## 2022-07-15 NOTE — Telephone Encounter (Signed)
Last appt 12/13/2021   Last written 12/13/2021 #150 g with 1 refill  Lab Results  Component Value Date   TESTOSTERONE 797 12/13/2021

## 2022-07-25 ENCOUNTER — Ambulatory Visit: Payer: BC Managed Care – PPO | Admitting: Family Medicine

## 2022-08-05 ENCOUNTER — Encounter: Payer: Self-pay | Admitting: Family Medicine

## 2022-08-05 ENCOUNTER — Ambulatory Visit (INDEPENDENT_AMBULATORY_CARE_PROVIDER_SITE_OTHER): Payer: 59 | Admitting: Family Medicine

## 2022-08-05 ENCOUNTER — Ambulatory Visit (INDEPENDENT_AMBULATORY_CARE_PROVIDER_SITE_OTHER): Payer: 59

## 2022-08-05 VITALS — BP 135/69 | HR 68 | Ht 70.0 in | Wt 247.0 lb

## 2022-08-05 DIAGNOSIS — M79605 Pain in left leg: Secondary | ICD-10-CM

## 2022-08-05 DIAGNOSIS — M79604 Pain in right leg: Secondary | ICD-10-CM | POA: Diagnosis not present

## 2022-08-05 DIAGNOSIS — E291 Testicular hypofunction: Secondary | ICD-10-CM

## 2022-08-05 DIAGNOSIS — I83813 Varicose veins of bilateral lower extremities with pain: Secondary | ICD-10-CM

## 2022-08-05 MED ORDER — TESTOSTERONE 1.62 % TD GEL: TRANSDERMAL | 1 refills | Status: DC

## 2022-08-05 NOTE — Assessment & Plan Note (Signed)
Labs were done in May he is otherwise doing well.  Refills sent to pharmacy.

## 2022-08-05 NOTE — Progress Notes (Signed)
   Established Patient Office Visit  Subjective   Patient ID: Dustin Kennedy, male    DOB: Mar 17, 1966  Age: 57 y.o. MRN: [id number]  Chief Complaint  Patient presents with   Leg Pain    R leg pain x 2 mos mostly in groin and sometimes in calf    HPI  Pain in the right lower started about 2 months ago.  It occurred initially in his lower leg mostly laterally.  But then eventually started having pain in the right medial groin upper thigh area.  He does remember any specific trauma or injury he works out regularly.  He says it has been bothering him enough that its actually been waking him up at night.  But during the day has been able to push through.  He has not noted any swelling in that leg.  Sometimes he does get hot sensation in his right lower leg and into his foot.  But it does not necessarily feel hot to touch.  He does have some small lipomas.  He has had prior varicose vein surgery on that side.    ROS    Objective:     BP 135/69   Pulse 68   Ht 5\' 10"  (1.778 m)   Wt 247 lb (112 kg)   SpO2 94%   BMI 35.44 kg/m    Physical Exam Constitutional:      Appearance: He is well-developed.  HENT:     Head: Normocephalic and atraumatic.  Musculoskeletal:     Comments: Mild tenderness over the right lower lateral leg.  Nontender behind the knee.  Skin:    General: Skin is warm and dry.  Neurological:     Mental Status: He is alert and oriented to person, place, and time.  Psychiatric:        Behavior: Behavior normal.      No results found for any visits on 08/05/22.    The 10-year ASCVD risk score (Arnett DK, et al., 2019) is: 6.6%    Assessment & Plan:   Problem List Items Addressed This Visit       Endocrine   Hypogonadism in male    Labs were done in May he is otherwise doing well.  Refills sent to pharmacy.      Relevant Medications   Testosterone 1.62 % GEL   Other Visit Diagnoses     Pain in both lower extremities    -  Primary   Varicose veins  of both lower extremities with pain       Right leg pain       Relevant Orders   US Venous Img Lower Unilateral Right      Leg pain mostly affecting the right lower lateral leg and the right inner thigh towards the groin area.  We discussed ruling out a DVT by doing a Doppler especially since he has been flying and traveling.  And his pain has been waking him up at night though it has been going on for 2 months which is would be a little unusual.  If negative consider returning to the vein and vascular specialist for further workup as otherwise I do not have a great explanation for his pain and discomfort.  Return in about 6 months (around 02/03/2023).    Beatrice Lecher, MD

## 2022-08-06 ENCOUNTER — Encounter: Payer: Self-pay | Admitting: Family Medicine

## 2022-08-06 NOTE — Progress Notes (Signed)
Dustin Kennedy,  They did see a superficial clot.  It is not completely blocking the blood vessel but it is a clot.  It is not in a deep vein.  So the treatment for this is usually an anti-inflammatory for a week.  Recommend ibuprofen 600 mg 3 times a day for 1 week.  Stay active.  Again this is not a deep clot.  It is not the kind that we worry about going to your lungs or your brain.  They did note that you had some swollen lymph nodes in that right groin area.  Are you having any discomfort in the pelvic area?  Your PSA just came back normal back in the spring so that is reassuring.

## 2022-08-23 NOTE — Telephone Encounter (Signed)
Lets schedule him with Dr. Darene Lamer as soon as we can.next week

## 2022-08-26 ENCOUNTER — Ambulatory Visit (INDEPENDENT_AMBULATORY_CARE_PROVIDER_SITE_OTHER): Payer: 59

## 2022-08-26 ENCOUNTER — Ambulatory Visit: Payer: 59 | Admitting: Sports Medicine

## 2022-08-26 ENCOUNTER — Encounter: Payer: Self-pay | Admitting: Sports Medicine

## 2022-08-26 VITALS — BP 150/91 | HR 81 | Temp 98.2°F | Ht 70.0 in | Wt 247.1 lb

## 2022-08-26 DIAGNOSIS — I82811 Embolism and thrombosis of superficial veins of right lower extremities: Secondary | ICD-10-CM | POA: Diagnosis not present

## 2022-08-26 DIAGNOSIS — M1611 Unilateral primary osteoarthritis, right hip: Secondary | ICD-10-CM | POA: Diagnosis not present

## 2022-08-26 DIAGNOSIS — M5416 Radiculopathy, lumbar region: Secondary | ICD-10-CM

## 2022-08-26 MED ORDER — MELOXICAM 15 MG PO TABS: ORAL_TABLET | ORAL | 3 refills | Status: DC

## 2022-08-26 NOTE — Assessment & Plan Note (Signed)
Genie is also planing of pain at right groin worse with activity. He does get significant gelling, on exam he does have reproduction of pain with internal rotation of the hip consistent with a hip joint pain generator. We will switch him from ibuprofen to meloxicam, I would like some x-rays and we will give him some hip arthritis conditioning.

## 2022-08-26 NOTE — Progress Notes (Signed)
    Procedures performed today:    None.  Independent interpretation of notes and tests performed by another provider:   None.  Brief History, Exam, Impression, and Recommendations:    Acute superficial venous thrombosis of lower extremity, right This is a very pleasant 57 year old male, he recalls increasing pain and swelling right thigh, lower leg. A lower extremity Doppler ultrasound was ordered by his PCP which did reveal a mid thigh greater saphenous vein partially occlusive thrombosis, thrombosis was not reported to involve the saphenofemoral junction. He did have a 2-hour flight approximately 2 weeks before the clot, he also had a viral illness before the clot, suspected to be flu, he had a negative COVID swab but did not get a flu swab. For this reason I think the superficial venous thrombosis is likely provoked, and as it is not a deep vein thrombosis we can simply treat this with NSAIDs.   Primary osteoarthritis of right hip Theadore is also planing of pain at right groin worse with activity. He does get significant gelling, on exam he does have reproduction of pain with internal rotation of the hip consistent with a hip joint pain generator. We will switch him from ibuprofen to meloxicam, I would like some x-rays and we will give him some hip arthritis conditioning.  Lumbar radiculopathy, right Also complaining of some axial pain in the back with radiation down the right leg, to the calf with numbness and tingling in the right foot. Often worse laying flat at night. No bowel or bladder dysfunction though he has difficulty putting on muscle in his lower extremities. I do suspect he has an element of lumbar spinal stenosis. Meloxicam as above, x-rays, home conditioning, in 6 weeks if not significantly better we will proceed with lumbar spine MRI.    ____________________________________________ Gwen Her. Dianah Field, M.D., ABFM., CAQSM., AME. Primary Care and Sports  Medicine [facility]  Adjunct Professor of [employer] [employer] [employer]  Risk manager

## 2022-08-26 NOTE — Assessment & Plan Note (Signed)
Also complaining of some axial pain in the back with radiation down the right leg, to the calf with numbness and tingling in the right foot. Often worse laying flat at night. No bowel or bladder dysfunction though he has difficulty putting on muscle in his lower extremities. I do suspect he has an element of lumbar spinal stenosis. Meloxicam as above, x-rays, home conditioning, in 6 weeks if not significantly better we will proceed with lumbar spine MRI.

## 2022-08-26 NOTE — Assessment & Plan Note (Signed)
This is a very pleasant 57 year old male, he recalls increasing pain and swelling right thigh, lower leg. A lower extremity Doppler ultrasound was ordered by his PCP which did reveal a mid thigh greater saphenous vein partially occlusive thrombosis, thrombosis was not reported to involve the saphenofemoral junction. He did have a 2-hour flight approximately 2 weeks before the clot, he also had a viral illness before the clot, suspected to be flu, he had a negative COVID swab but did not get a flu swab. For this reason I think the superficial venous thrombosis is likely provoked, and as it is not a deep vein thrombosis we can simply treat this with NSAIDs.

## 2022-09-29 ENCOUNTER — Encounter: Payer: Self-pay | Admitting: Family Medicine

## 2022-10-01 ENCOUNTER — Telehealth: Payer: Self-pay

## 2022-10-01 NOTE — Telephone Encounter (Addendum)
Initiated Prior authorization WUJ:[id number] 1.62% gel Via: Covermymeds Case/Key:[id number] Status: approved  as of 10/01/22 Reason:Authorization Expiration Date: 10/01/2023  Notified Pt via: Mychart

## 2022-11-06 ENCOUNTER — Other Ambulatory Visit: Payer: Self-pay | Admitting: Family Medicine

## 2022-11-07 ENCOUNTER — Encounter: Payer: Self-pay | Admitting: Family Medicine

## 2022-11-18 ENCOUNTER — Other Ambulatory Visit: Payer: Self-pay | Admitting: *Deleted

## 2022-11-18 DIAGNOSIS — M1611 Unilateral primary osteoarthritis, right hip: Secondary | ICD-10-CM

## 2022-11-18 MED ORDER — MELOXICAM 15 MG PO TABS: 15.0000 mg | ORAL_TABLET | ORAL | 1 refills | Status: DC | PRN

## 2022-11-20 ENCOUNTER — Other Ambulatory Visit: Payer: Self-pay | Admitting: Sports Medicine

## 2022-11-20 DIAGNOSIS — M1611 Unilateral primary osteoarthritis, right hip: Secondary | ICD-10-CM

## 2022-11-20 MED ORDER — MELOXICAM 15 MG PO TABS: 15.0000 mg | ORAL_TABLET | Freq: Every day | ORAL | 3 refills | Status: DC | PRN

## 2022-12-24 ENCOUNTER — Encounter (HOSPITAL_BASED_OUTPATIENT_CLINIC_OR_DEPARTMENT_OTHER): Payer: Self-pay

## 2022-12-24 ENCOUNTER — Emergency Department (HOSPITAL_BASED_OUTPATIENT_CLINIC_OR_DEPARTMENT_OTHER)
Admission: EM | Admit: 2022-12-24 | Discharge: 2022-12-24 | Disposition: A | Discharge status: Home / Self Care | Payer: 59 | Attending: Emergency Medicine | Admitting: Emergency Medicine

## 2022-12-24 ENCOUNTER — Emergency Department (HOSPITAL_BASED_OUTPATIENT_CLINIC_OR_DEPARTMENT_OTHER): Payer: 59

## 2022-12-24 ENCOUNTER — Other Ambulatory Visit: Payer: Self-pay

## 2022-12-24 DIAGNOSIS — R2241 Localized swelling, mass and lump, right lower limb: Secondary | ICD-10-CM | POA: Diagnosis present

## 2022-12-24 DIAGNOSIS — I8001 Phlebitis and thrombophlebitis of superficial vessels of right lower extremity: Secondary | ICD-10-CM | POA: Insufficient documentation

## 2022-12-24 NOTE — ED Triage Notes (Signed)
Patient here POV from Home.  Endorses area of localized pain and swelling to Right Lower Thigh (Posterior). Noted a few hours ago. Possible Bite.   NAD Noted during triage. A&Ox4. GCS 15. Ambulatory.

## 2022-12-24 NOTE — ED Notes (Signed)
Explained to pt why he is waiting for U/S

## 2022-12-24 NOTE — ED Provider Notes (Signed)
[place] EMERGENCY DEPARTMENT AT [facility] Provider Note   CSN: [id number] Arrival date & time: 12/24/22  1909     History  Chief Complaint  Patient presents with   Edema    Dustin Kennedy is a 57 y.o. male.  Patient presents to the emergency room complaining of a swollen area to the right inner thigh which is tender to palpation.  Patient states he first noticed this earlier this afternoon and had his wife look at the area.  She noticed that it was mildly swollen with some mild redness.  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  He is also concerned that could be an insect bite as he has some insect bites on his lower legs.  He denies fevers, nausea, vomiting, shortness of breath, chest pain.  Past medical history significant for chronic venous insufficiency, superficial venous thrombosis of lower right extremity  HPI     Home Medications Prior to Admission medications   Medication Sig Start Date End Date Taking? Authorizing Provider  eletriptan (RELPAX) 20 MG tablet Take 1 tablet (20 mg total) by mouth as needed for migraine or headache. May repeat in 2 hours if headache persists or recurs. 12/13/21   Agapito Games, MD  meloxicam (MOBIC) 15 MG tablet Take 1 tablet (15 mg total) by mouth daily as needed for pain. 11/20/22   Monica Becton, MD  Multiple Vitamins-Minerals (MULTIVITAMIN ADULT PO) Take by mouth.    [provider]  Omega-3 Fatty Acids (FISH OIL) 1000 MG CAPS Take by mouth.    [provider]  Testosterone 1.62 % GEL APPLY 2 PUMPS TO 1 UPPER ARM AND 2 PUMPS TO OPPOSITE ARM EVERY MORNING.TOTAL 4 PUMPS DAILY 08/05/22   Agapito Games, MD  topiramate (TOPAMAX) 25 MG tablet TAKE 1 TABLET(25 MG) BY MOUTH TWICE DAILY 11/07/22   Agapito Games, MD      Allergies    Azucena Freed [testosterone]    Review of Systems   Review of Systems  Physical  Exam Updated Vital Signs BP (!) 156/84 (BP Location: Right Arm)   Pulse 63   Temp 98.8 F (37.1 C) (Oral)   Resp 20   Ht 5\' 10"  (1.778 m)   Wt 113.4 kg   SpO2 97%   BMI 35.87 kg/m  Physical Exam Vitals and nursing note reviewed.  Constitutional:      General: He is not in acute distress.    Appearance: He is well-developed.  HENT:     Head: Normocephalic and atraumatic.  Eyes:     Conjunctiva/sclera: Conjunctivae normal.  Cardiovascular:     Rate and Rhythm: Normal rate and regular rhythm.     Heart sounds: No murmur heard. Pulmonary:     Effort: Pulmonary effort is normal. No respiratory distress.     Breath sounds: Normal breath sounds.  Abdominal:     Palpations: Abdomen is soft.     Tenderness: There is no abdominal tenderness.  Musculoskeletal:        General: No swelling.     Cervical back: Neck supple.     Comments: Swollen area noted to inner right thigh just proximal to the knee with mild erythema.  No significant warmth.  Tender to palpation  Skin:    General: Skin is warm and dry.     Capillary Refill: Capillary refill takes less than 2 seconds.  Neurological:  Mental Status: He is alert.  Psychiatric:        Mood and Affect: Mood normal.     ED Results / Procedures / Treatments   Labs (all labs ordered are listed, but only abnormal results are displayed) Labs Reviewed - No data to display  EKG None  Radiology US Venous Img Lower Right (DVT Study)  Result Date: 12/24/2022 CLINICAL DATA:  Right lower extremity edema. EXAM: RIGHT LOWER EXTREMITY VENOUS DOPPLER ULTRASOUND TECHNIQUE: Gray-scale sonography with graded compression, as well as color Doppler and duplex ultrasound were performed to evaluate the lower extremity deep venous systems from the level of the common femoral vein and including the common femoral, femoral, profunda femoral, popliteal and calf veins including the posterior tibial, peroneal and gastrocnemius veins when visible. The  superficial great saphenous vein was also interrogated. Spectral Doppler was utilized to evaluate flow at rest and with distal augmentation maneuvers in the common femoral, femoral and popliteal veins. COMPARISON:  August 05, 2022 FINDINGS: Contralateral Common Femoral Vein: Respiratory phasicity is normal and symmetric with the symptomatic side. No evidence of thrombus. Normal compressibility. Common Femoral Vein: No evidence of thrombus. Normal compressibility, respiratory phasicity and response to augmentation. Saphenofemoral Junction: No evidence of thrombus. Normal compressibility and flow on color Doppler imaging. Profunda Femoral Vein: No evidence of thrombus. Normal compressibility and flow on color Doppler imaging. Femoral Vein: No evidence of thrombus. Normal compressibility, respiratory phasicity and response to augmentation. Popliteal Vein: No evidence of thrombus. Normal compressibility, respiratory phasicity and response to augmentation. Calf Veins: No evidence of thrombus. Normal compressibility and flow on color Doppler imaging. Superficial Great Saphenous Vein: Nonocclusive thrombus (superficial) is seen within the RIGHT greater saphenous vein, extending from the mid RIGHT thigh to the upper RIGHT calf. This is seen on the prior study. Venous Reflux:  None. Other Findings:  None. IMPRESSION: 1. No evidence of RIGHT lower extremity deep venous thrombosis. 2. Persistent nonocclusive superficial thrombus within the RIGHT greater saphenous vein. Electronically Signed   By: Aram Candela M.D.   On: 12/24/2022 22:46    Procedures Procedures    Medications Ordered in ED Medications - No data to display  ED Course/ Medical Decision Making/ A&P                             Medical Decision Making  Patient presents with a chief complaint of a swollen her thigh.  Differential diagnosis includes but not limited to phlebitis, superficial vein thrombosis, DVT, abscess, cellulitis,  others  Patient is comorbidities including history of a superficial vein thrombosis.  I reviewed outside medical records including notes from family medicine  I ordered and interpreted imaging including ultrasound DVT study of the right lower extremity.  Findings with no DVT.  Superficial vein thrombosis of the saphenous vein noted.  Findings consistent with superficial thrombosis/phlebitis.  No fluctuance or induration to suggest abscess.  No significant erythema to suggest cellulitis.  Plan to discharge home with instructions for supportive care including warm compresses and NSAIDs.  Patient will follow-up as needed with his primary care provider.  Return precautions provided        Final Clinical Impression(s) / ED Diagnoses Final diagnoses:  Saphenous vein phlebitis, right    Rx / DC Orders ED Discharge Orders     None         Pamala Duffel 12/24/22 2317    Benjiman Core, MD 12/24/22 2332

## 2022-12-24 NOTE — Discharge Instructions (Signed)
You were seen today for a tender area on the right inner leg consistent with phlebitis.  Please use warm compresses and NSAID medications.  Please follow-up with me with your primary care provider.  This should resolve on its own.  If you develop shortness of breath or other life-threatening symptoms please return to the emergency department.

## 2022-12-25 ENCOUNTER — Telehealth: Payer: Self-pay | Admitting: General Practice

## 2022-12-25 ENCOUNTER — Encounter: Payer: Self-pay | Admitting: Family Medicine

## 2022-12-25 DIAGNOSIS — M79604 Pain in right leg: Secondary | ICD-10-CM

## 2022-12-25 DIAGNOSIS — I83813 Varicose veins of bilateral lower extremities with pain: Secondary | ICD-10-CM

## 2022-12-25 NOTE — Telephone Encounter (Signed)
I agree, lets definitely get him back in with the surgeon.  Okay to go ahead and place referral.

## 2022-12-25 NOTE — Transitions of Care (Post Inpatient/ED Visit) (Signed)
   12/25/2022  Name: Dustin Kennedy MRN: [id number] DOB: April 21, 1966  Today's TOC FU Call Status: Today's TOC FU Call Status:: Successful TOC FU Call Competed TOC FU Call Complete Date: 12/25/22  Transition Care Management Follow-up Telephone Call Date of Discharge: 12/24/22 Discharge Facility: [facility] ([facility]) Type of Discharge: Emergency Department Reason for ED Visit: Cardiac Conditions Cardiac Conditions Diagnosis:  (Sephalic vein phlebitis) How have you been since you were released from the hospital?: Same Any questions or concerns?: No  Items Reviewed: Did you receive and understand the discharge instructions provided?: Yes Medications obtained,verified, and reconciled?: Yes (Medications Reviewed) Any new allergies since your discharge?: No Dietary orders reviewed?: NA Do you have support at home?: Yes  Medications Reviewed Today: Medications Reviewed Today     Reviewed by Modesto Charon, RN (Registered Nurse) on 12/25/22 at 1541  Med List Status: <None>   Medication Order Taking? Sig Documenting Provider Last Dose Status Informant  eletriptan (RELPAX) 20 MG tablet [id number] No Take 1 tablet (20 mg total) by mouth as needed for migraine or headache. May repeat in 2 hours if headache persists or recurs. Agapito Games, MD Taking Active   meloxicam [facility]) 15 MG tablet [id number]  Take 1 tablet (15 mg total) by mouth daily as needed for pain. Monica Becton, MD  Active   Multiple Vitamins-Minerals (MULTIVITAMIN ADULT PO) [id number] No Take by mouth. [provider] Taking Active   Omega-3 Fatty Acids (FISH OIL) 1000 MG CAPS [id number] No Take by mouth. [provider] Taking Active   Testosterone 1.62 % GEL [id number] No APPLY 2 PUMPS TO 1 UPPER ARM AND 2 PUMPS TO OPPOSITE ARM EVERY MORNING.TOTAL 4 PUMPS DAILY Agapito Games, MD Taking Active   topiramate (TOPAMAX) 25 MG tablet [id number]  TAKE 1 TABLET(25 MG) BY MOUTH TWICE DAILY  Agapito Games, MD  Active             Home Care and Equipment/Supplies: Were Home Health Services Ordered?: NA Any new equipment or medical supplies ordered?: NA  Functional Questionnaire: Do you need assistance with bathing/showering or dressing?: No Do you need assistance with meal preparation?: No Do you need assistance with eating?: No Do you have difficulty maintaining continence: No Do you need assistance with getting out of bed/getting out of a chair/moving?: No Do you have difficulty managing or taking your medications?: No  Follow up appointments reviewed: PCP Follow-up appointment confirmed?: NA Specialist Hospital Follow-up appointment confirmed?: No Reason Specialist Follow-Up Not Confirmed: Patient has Specialist Provider Number and will Call for Appointment Do you need transportation to your follow-up appointment?: No Do you understand care options if your condition(s) worsen?: Yes-patient verbalized understanding    SIGNATURE Modesto Charon, RN BSN

## 2022-12-25 NOTE — Telephone Encounter (Signed)
Orders Placed This Encounter  Procedures   Ambulatory referral to Vascular Surgery    Referral Priority:   Routine    Referral Type:   Surgical    Referral Reason:   Specialty Services Required    Requested Specialty:   Vascular Surgery    Number of Visits Requested:   1   Recommend compression thigh high stocking, hydrate well.

## 2022-12-30 ENCOUNTER — Telehealth: Payer: Self-pay | Admitting: *Deleted

## 2022-12-30 MED ORDER — TESTOSTERONE 50 MG/5GM (1%) TD GEL: 10.0000 g | Freq: Every day | TRANSDERMAL | 2 refills | Status: DC

## 2022-12-30 NOTE — Telephone Encounter (Signed)
See note. Got message from [facility] to change test.   Meds ordered this encounter  Medications   testosterone (ANDROGEL) 50 MG/5GM (1%) GEL    Sig: Place 10 g onto the skin daily.    Dispense:  300 g    Refill:  2

## 2023-01-02 ENCOUNTER — Encounter (INDEPENDENT_AMBULATORY_CARE_PROVIDER_SITE_OTHER): Payer: 59 | Admitting: Family Medicine

## 2023-01-02 DIAGNOSIS — S70369A Insect bite (nonvenomous), unspecified thigh, initial encounter: Secondary | ICD-10-CM | POA: Diagnosis not present

## 2023-01-02 DIAGNOSIS — W57XXXA Bitten or stung by nonvenomous insect and other nonvenomous arthropods, initial encounter: Secondary | ICD-10-CM

## 2023-01-03 MED ORDER — DOXYCYCLINE HYCLATE 100 MG PO TABS
200.0000 mg | ORAL_TABLET | Freq: Once | ORAL | 0 refills | Status: AC
Start: 2023-01-03 — End: 2023-01-03

## 2023-01-03 NOTE — Telephone Encounter (Signed)

## 2023-04-01 ENCOUNTER — Encounter: Payer: Self-pay | Admitting: Family Medicine

## 2023-04-03 MED ORDER — NIRMATRELVIR/RITONAVIR (PAXLOVID)TABLET: 3.0000 | ORAL_TABLET | Freq: Two times a day (BID) | ORAL | 0 refills | Status: AC

## 2023-04-03 NOTE — Addendum Note (Signed)
Addended by: Nani Gasser D on: 04/03/2023 07:59 AM   Modules accepted: Orders

## 2023-05-07 ENCOUNTER — Telehealth: Payer: Self-pay

## 2023-05-07 NOTE — Telephone Encounter (Signed)
Please given Tdap and flu vaccine.

## 2023-05-07 NOTE — Telephone Encounter (Signed)
Patient scheduled for flu and Tdap nurse visit tomorrow.  Did not see in chart where Tdap had been approved by provider.  O.k. to give vaccines?

## 2023-05-08 ENCOUNTER — Ambulatory Visit (INDEPENDENT_AMBULATORY_CARE_PROVIDER_SITE_OTHER): Payer: 59

## 2023-05-08 DIAGNOSIS — Z23 Encounter for immunization: Secondary | ICD-10-CM | POA: Diagnosis not present

## 2023-07-15 ENCOUNTER — Telehealth: Payer: Self-pay | Admitting: Family Medicine

## 2023-07-19 ENCOUNTER — Encounter: Payer: Self-pay | Admitting: Family Medicine

## 2023-07-21 ENCOUNTER — Other Ambulatory Visit: Payer: Self-pay | Admitting: Family Medicine

## 2023-07-21 MED ORDER — ELETRIPTAN HYDROBROMIDE 20 MG PO TABS: 20.0000 mg | ORAL_TABLET | ORAL | 5 refills | Status: DC | PRN

## 2023-07-21 MED ORDER — TOPIRAMATE 25 MG PO TABS: ORAL_TABLET | ORAL | 0 refills | Status: DC

## 2023-07-21 MED ORDER — TESTOSTERONE 50 MG/5GM (1%) TD GEL: 10.0000 g | Freq: Every day | TRANSDERMAL | 0 refills | Status: DC

## 2023-07-21 NOTE — Telephone Encounter (Signed)
Meds ordered this encounter  Medications   eletriptan (RELPAX) 20 MG tablet    Sig: Take 1 tablet (20 mg total) by mouth as needed for migraine or headache. May repeat in 2 hours if headache persists or recurs.    Dispense:  10 tablet    Refill:  5   testosterone (ANDROGEL) 50 MG/5GM (1%) GEL    Sig: Place 10 g onto the skin daily.    Dispense:  300 g    Refill:  0   topiramate (TOPAMAX) 25 MG tablet    Sig: TAKE 1 TABLET(25 MG) BY MOUTH TWICE DAILY    Dispense:  180 tablet    Refill:  0

## 2023-07-24 ENCOUNTER — Telehealth: Payer: Self-pay

## 2023-07-24 NOTE — Telephone Encounter (Signed)
Please check on PA for ANdrogel. Pt saying needs PA

## 2023-07-24 NOTE — Telephone Encounter (Addendum)
Initiated Prior authorization NWG:[id number] 50 MG/5GM(1%) gel Via: Covermymeds Case/Key:[id number] Status: n/a  as of 07/24/23 Reason:This medication or product was previously approved on [id number] from 2022-10-01 to 2023-10-01. **Please note: This request was submitted electronically. Formulary lowering, tiering exception, cost reduction and/or pre-benefit determination review (including prospective Medicare hospice reviews) requests cannot be requested using this method of submission. Providers contact us at [phone] for further assistance. Notified Pt via: Mychart

## 2023-07-31 ENCOUNTER — Ambulatory Visit: Payer: 59 | Admitting: Family Medicine

## 2023-07-31 VITALS — BP 135/76 | HR 75 | Ht 70.0 in | Wt 257.0 lb

## 2023-07-31 DIAGNOSIS — R7309 Other abnormal glucose: Secondary | ICD-10-CM | POA: Diagnosis not present

## 2023-07-31 DIAGNOSIS — G43009 Migraine without aura, not intractable, without status migrainosus: Secondary | ICD-10-CM

## 2023-07-31 DIAGNOSIS — Z13220 Encounter for screening for lipoid disorders: Secondary | ICD-10-CM | POA: Diagnosis not present

## 2023-07-31 DIAGNOSIS — E291 Testicular hypofunction: Secondary | ICD-10-CM

## 2023-07-31 MED ORDER — TESTOSTERONE 50 MG/5GM (1%) TD GEL: 10.0000 g | Freq: Every day | TRANSDERMAL | 1 refills | Status: DC

## 2023-07-31 NOTE — Progress Notes (Signed)
 Established Patient Office Visit  Subjective  Patient ID: Dustin Kennedy, male    DOB: [birth year]  Age: 58 y.o. MRN: [id number]  Chief Complaint  Patient presents with   Medical Management of Chronic Issues    HPI  F/U migraines -reports migraine are doing well overall they are fairly infrequent and the Relpax  does work when he uses it.  F/U hypogonadism -had some issues getting the testosterone .  They changed the formulation that was covered.  And then when he tried to get it filled in December it was different than the one he had in the fall.  He prefers to have the little tubes that he opens and then dispenses onto the skin.  We were able to get the prior authorization approved on December 26.  New prescription sent to pharmacy will have to see if this 1 is still covered in the 1 that he would prefer to have.  On our end the prescriptions do not really look different so it is a little confusing what the pharmacy might be seeing on their end.  Have some questions about GLP-1's for weight loss.  He struggles sometimes with getting enough calories and food in for his exercise routine.    ROS    Objective:     BP 135/76   Pulse 75   Ht 5' 10 (1.778 m)   Wt 257 lb (116.6 kg)   SpO2 97%   BMI 36.88 kg/m    Physical Exam Vitals and nursing note reviewed.  Constitutional:      Appearance: Normal appearance.  HENT:     Head: Normocephalic and atraumatic.  Eyes:     Conjunctiva/sclera: Conjunctivae normal.  Cardiovascular:     Rate and Rhythm: Normal rate and regular rhythm.  Pulmonary:     Effort: Pulmonary effort is normal.     Breath sounds: Normal breath sounds.  Skin:    General: Skin is warm and dry.  Neurological:     Mental Status: He is alert.  Psychiatric:        Mood and Affect: Mood normal.     No results found for any visits on 07/31/23.    The 10-year ASCVD risk score (Arnett DK, et al., 2019) is: 7.3%    Assessment & Plan:   Problem List  Items Addressed This Visit       Cardiovascular and Mediastinum   Migraine headache   Can you prophylaxis with Topamax  and as needed Relpax .  Consider discontinuing Topamax  at next office visit if he still doing well.      Relevant Orders   Hemoglobin A1c   CMP14+EGFR   Lipid panel   CBC   PSA   Testosterone  Total,Free,Bio, Males     Endocrine   Hypogonadism in male - Primary   Overdue for labs we will get those updated today will call with results once available and make any adjustments needed.  Call if any concerns or problems otherwise follow-up in 6 months.      Relevant Medications   testosterone  (ANDROGEL ) 50 MG/5GM (1%) GEL   Other Relevant Orders   Hemoglobin A1c   CMP14+EGFR   Lipid panel   CBC   PSA   Testosterone  Total,Free,Bio, Males   Testosterone , Free, Total, SHBG   Other Visit Diagnoses       Abnormal glucose       Relevant Orders   Hemoglobin A1c     Screening, lipid       Relevant  Orders   Hemoglobin A1c   CMP14+EGFR   Lipid panel   CBC   PSA   Testosterone  Total,Free,Bio, Males       Return in about 6 months (around 01/28/2024), or testorone and migraine.    Dorothyann Byars, MD

## 2023-07-31 NOTE — Assessment & Plan Note (Signed)
 Overdue for labs we will get those updated today will call with results once available and make any adjustments needed.  Call if any concerns or problems otherwise follow-up in 6 months.

## 2023-07-31 NOTE — Assessment & Plan Note (Signed)
 Can you prophylaxis with Topamax and as needed Relpax.  Consider discontinuing Topamax at next office visit if he still doing well.

## 2023-08-01 LAB — CMP14+EGFR
ALT: 40 [IU]/L (ref 0–44)
AST: 35 [IU]/L (ref 0–40)
Albumin: 4.5 g/dL (ref 3.8–4.9)
Alkaline Phosphatase: 84 [IU]/L (ref 44–121)
BUN/Creatinine Ratio: 21 — ABNORMAL HIGH (ref 9–20)
BUN: 20 mg/dL (ref 6–24)
Bilirubin Total: 0.8 mg/dL (ref 0.0–1.2)
CO2: 22 mmol/L (ref 20–29)
Calcium: 9.5 mg/dL (ref 8.7–10.2)
Chloride: 107 mmol/L — ABNORMAL HIGH (ref 96–106)
Creatinine, Ser: 0.97 mg/dL (ref 0.76–1.27)
Globulin, Total: 2.3 g/dL (ref 1.5–4.5)
Glucose: 109 mg/dL — ABNORMAL HIGH (ref 70–99)
Potassium: 4.3 mmol/L (ref 3.5–5.2)
Sodium: 141 mmol/L (ref 134–144)
Total Protein: 6.8 g/dL (ref 6.0–8.5)
eGFR: 90 mL/min/{1.73_m2} (ref >59)

## 2023-08-01 LAB — LIPID PANEL
Chol/HDL Ratio: 4.4 {ratio} (ref 0.0–5.0)
Cholesterol, Total: 196 mg/dL (ref 100–199)
HDL: 45 mg/dL (ref >39)
LDL Chol Calc (NIH): 127 mg/dL — ABNORMAL HIGH (ref 0–99)
Triglycerides: 131 mg/dL (ref 0–149)
VLDL Cholesterol Cal: 24 mg/dL (ref 5–40)

## 2023-08-01 LAB — CBC
Hematocrit: 49 % (ref 37.5–51.0)
Hemoglobin: 16.8 g/dL (ref 13.0–17.7)
MCH: 31.2 pg (ref 26.6–33.0)
MCHC: 34.3 g/dL (ref 31.5–35.7)
MCV: 91 fL (ref 79–97)
Platelets: 266 10*3/uL (ref 150–450)
RBC: 5.38 x10E6/uL (ref 4.14–5.80)
RDW: 12.5 % (ref 11.6–15.4)
WBC: 6.4 10*3/uL (ref 3.4–10.8)

## 2023-08-01 LAB — HEMOGLOBIN A1C
Est. average glucose Bld gHb Est-mCnc: 114 mg/dL
Hgb A1c MFr Bld: 5.6 % (ref 4.8–5.6)

## 2023-08-01 LAB — PSA: Prostate Specific Ag, Serum: 1.5 ng/mL (ref 0.0–4.0)

## 2023-08-01 NOTE — Progress Notes (Signed)
 Hi Dustin Kennedy, metabolic panel overall looks good the glucose was just borderline elevated but I think you said you were not 100% fasting.  Liver function looks great.  LDL is elevated goal is under 100 and yours is 127 continue to work on healthy diet A1c looks good.  Blood counts normal no sign of anemia prostate test is normal.  Testosterone  levels so far look good.  The 10-year ASCVD risk score (Arnett DK, et al., 2019) is: 8.5%   Values used to calculate the score:     Age: 58 years     Sex: Male     Is Non-Hispanic African American: No     Diabetic: No     Tobacco smoker: No     Systolic Blood Pressure: 135 mmHg     Is BP treated: No     HDL Cholesterol: 45 mg/dL     Total Cholesterol: 196 mg/dL

## 2023-08-02 LAB — TESTOSTERONE, FREE, TOTAL, SHBG
Sex Hormone Binding: 36.5 nmol/L (ref 19.3–76.4)
Testosterone, Free: 18 pg/mL (ref 7.2–24.0)
Testosterone: 649 ng/dL (ref 264–916)

## 2023-08-04 ENCOUNTER — Encounter: Payer: Self-pay | Admitting: Family Medicine

## 2023-08-04 NOTE — Progress Notes (Signed)
 Your lab work is within acceptable range and there are no concerning findings.   ?

## 2023-08-18 ENCOUNTER — Other Ambulatory Visit: Payer: Self-pay | Admitting: *Deleted

## 2023-08-18 MED ORDER — TOPIRAMATE 25 MG PO TABS: ORAL_TABLET | ORAL | 0 refills | Status: DC

## 2023-09-25 ENCOUNTER — Other Ambulatory Visit: Payer: Self-pay | Admitting: Sports Medicine

## 2023-09-25 DIAGNOSIS — M1611 Unilateral primary osteoarthritis, right hip: Secondary | ICD-10-CM

## 2023-10-17 ENCOUNTER — Encounter: Payer: Self-pay | Admitting: Family Medicine

## 2023-10-17 DIAGNOSIS — E291 Testicular hypofunction: Secondary | ICD-10-CM

## 2023-10-17 MED ORDER — TESTOSTERONE 20.25 MG/ACT (1.62%) TD GEL: 3.0000 | Freq: Every morning | TRANSDERMAL | 2 refills | Status: DC

## 2023-10-21 ENCOUNTER — Telehealth: Payer: Self-pay | Admitting: Pharmacy Technician

## 2023-10-21 ENCOUNTER — Other Ambulatory Visit (HOSPITAL_COMMUNITY): Payer: Self-pay

## 2023-10-21 NOTE — Telephone Encounter (Signed)
 Pharmacy Patient Advocate Encounter   Received notification from Onbase that prior authorization for Testosterone 20.25 MG/ACT(1.62%) gel is required/requested.   Insurance verification completed.   The patient is insured through [facility] .   Per test claim: PA required; PA submitted to above mentioned insurance via CoverMyMeds Key/confirmation #/EOC [facility] Status is pending

## 2023-10-22 ENCOUNTER — Other Ambulatory Visit (HOSPITAL_COMMUNITY): Payer: Self-pay

## 2023-10-22 NOTE — Telephone Encounter (Signed)
 Pharmacy Patient Advocate Encounter  Received notification from [facility] that Prior Authorization for Testosterone 20.25 MG/ACT(1.62%) gel has been APPROVED from 10/21/2023 to 10/20/2024. Ran test claim, Copay is $43.53. This test claim was processed through [facility]- copay amounts may vary at other pharmacies due to pharmacy/plan contracts, or as the patient moves through the different stages of their insurance plan.   PA #/Case ID/Reference #: [id number]

## 2023-10-22 NOTE — Telephone Encounter (Signed)
 This PA has been approved in a separate encounter, thank you

## 2023-12-12 ENCOUNTER — Encounter: Payer: Self-pay | Admitting: Family Medicine

## 2023-12-24 ENCOUNTER — Other Ambulatory Visit: Payer: Self-pay | Admitting: Sports Medicine

## 2023-12-24 DIAGNOSIS — M1611 Unilateral primary osteoarthritis, right hip: Secondary | ICD-10-CM

## 2024-01-28 ENCOUNTER — Encounter: Payer: Self-pay | Admitting: Family Medicine

## 2024-01-28 ENCOUNTER — Ambulatory Visit: Payer: 59 | Admitting: Family Medicine

## 2024-01-28 VITALS — BP 126/84 | HR 72 | Ht 70.0 in | Wt 252.1 lb

## 2024-01-28 DIAGNOSIS — G43009 Migraine without aura, not intractable, without status migrainosus: Secondary | ICD-10-CM

## 2024-01-28 DIAGNOSIS — E291 Testicular hypofunction: Secondary | ICD-10-CM

## 2024-01-28 DIAGNOSIS — G4733 Obstructive sleep apnea (adult) (pediatric): Secondary | ICD-10-CM

## 2024-01-28 NOTE — Assessment & Plan Note (Addendum)
 He is actually doing really well he actually has not had to even fill his Relpax  since April and he is only taken 2 of them since then.  He still on Topamax  and would like to continue that for now but we did discuss that at some point in the future we could always consider taking him off of that.  Also discussed that if the Relpax  is quite expensive with his current plan we can always switch to a different triptan if he wants to check with them to see what might be better covered and let us  know happy to change it.

## 2024-01-28 NOTE — Assessment & Plan Note (Signed)
 He feels like he is doing well with his testosterone  replacement I think we finally got it straightened out for what his insurance would cover.  Will get updated labs today.  He thinks he is good on refills but were happy to refill when needed.

## 2024-01-28 NOTE — Progress Notes (Signed)
   Established Patient Office Visit  Subjective  Patient ID: Dustin Kennedy, male    DOB: 1965/12/16  Age: 58 y.o. MRN: [id number]  Chief Complaint  Patient presents with   Migraine   male hypogonadism    HPI  F/U migraines - he is on low dose topamax  and uses relpax  PRN/. He is doing really well.    Follow-up testosterone  replacement-it took us  a while to get something that was covered by the insurance we got a basically went back and forth for a while.  He is due for updated labs today but is otherwise doing well.     ROS    Objective:     BP 126/84   Pulse 72   Ht 5' 10 (1.778 m)   Wt 252 lb 1.3 oz (114.3 kg)   SpO2 95%   BMI 36.17 kg/m    Physical Exam Vitals and nursing note reviewed.  Constitutional:      Appearance: Normal appearance.  HENT:     Head: Normocephalic and atraumatic.  Eyes:     Conjunctiva/sclera: Conjunctivae normal.  Cardiovascular:     Rate and Rhythm: Normal rate and regular rhythm.  Pulmonary:     Effort: Pulmonary effort is normal.     Breath sounds: Normal breath sounds.  Skin:    General: Skin is warm and dry.  Neurological:     Mental Status: He is alert.  Psychiatric:        Mood and Affect: Mood normal.      No results found for any visits on 01/28/24.    The 10-year ASCVD risk score (Arnett DK, et al., 2019) is: 8.2%    Assessment & Plan:   Problem List Items Addressed This Visit       Cardiovascular and Mediastinum   Migraine headache - Primary   He is actually doing really well he actually has not had to even fill his Relpax  since April and he is only taken 2 of them since then.  He still on Topamax  and would like to continue that for now but we did discuss that at some point in the future we could always consider taking him off of that.  Also discussed that if the Relpax  is quite expensive with his current plan we can always switch to a different triptan if he wants to check with them to see what might be  better covered and let us  know happy to change it.      Relevant Orders   Testosterone , Free, Total, SHBG   CBC with Differential/Platelet     Respiratory   OSA (obstructive sleep apnea)   He is not using his CPAP.  He has not used it for probably a couple years now we did discuss possibly getting him retested for sleep apnea to see where things are at he says will think about it let me know.        Endocrine   Hypogonadism in male   He feels like he is doing well with his testosterone  replacement I think we finally got it straightened out for what his insurance would cover.  Will get updated labs today.  He thinks he is good on refills but were happy to refill when needed.      Relevant Orders   Testosterone , Free, Total, SHBG   CBC with Differential/Platelet    Return in about 6 months (around 07/30/2024) for migraines/testosterone .    Dorothyann Byars, MD

## 2024-01-28 NOTE — Assessment & Plan Note (Addendum)
 He is not using his CPAP.  He has not used it for probably a couple years now we did discuss possibly getting him retested for sleep apnea to see where things are at he says will think about it let me know.

## 2024-01-30 LAB — CBC WITH DIFFERENTIAL/PLATELET
Basophils Absolute: 0 x10E3/uL (ref 0.0–0.2)
Basos: 1 %
EOS (ABSOLUTE): 0.1 x10E3/uL (ref 0.0–0.4)
Eos: 2 %
Hematocrit: 50.3 % (ref 37.5–51.0)
Hemoglobin: 17.1 g/dL (ref 13.0–17.7)
Immature Grans (Abs): 0 x10E3/uL (ref 0.0–0.1)
Immature Granulocytes: 0 %
Lymphocytes Absolute: 1.9 x10E3/uL (ref 0.7–3.1)
Lymphs: 31 %
MCH: 31.1 pg (ref 26.6–33.0)
MCHC: 34 g/dL (ref 31.5–35.7)
MCV: 92 fL (ref 79–97)
Monocytes Absolute: 0.4 x10E3/uL (ref 0.1–0.9)
Monocytes: 6 %
Neutrophils Absolute: 3.7 x10E3/uL (ref 1.4–7.0)
Neutrophils: 60 %
Platelets: 259 x10E3/uL (ref 150–450)
RBC: 5.49 x10E6/uL (ref 4.14–5.80)
RDW: 12.6 % (ref 11.6–15.4)
WBC: 6.2 x10E3/uL (ref 3.4–10.8)

## 2024-01-30 LAB — TESTOSTERONE, FREE, TOTAL, SHBG
Sex Hormone Binding: 30.3 nmol/L (ref 19.3–76.4)
Testosterone, Free: 23.4 pg/mL (ref 7.2–24.0)
Testosterone: 867 ng/dL (ref 264–916)

## 2024-01-31 ENCOUNTER — Ambulatory Visit: Payer: Self-pay | Admitting: Family Medicine

## 2024-01-31 NOTE — Progress Notes (Signed)
 Your lab work is within acceptable range and there are no concerning findings.   ?

## 2024-02-09 ENCOUNTER — Other Ambulatory Visit: Payer: Self-pay | Admitting: Family Medicine

## 2024-02-09 DIAGNOSIS — E291 Testicular hypofunction: Secondary | ICD-10-CM

## 2024-02-18 ENCOUNTER — Other Ambulatory Visit: Payer: Self-pay | Admitting: *Deleted

## 2024-02-18 MED ORDER — TOPIRAMATE 25 MG PO TABS: ORAL_TABLET | ORAL | 0 refills | Status: DC

## 2024-03-30 ENCOUNTER — Encounter: Payer: Self-pay | Admitting: Sports Medicine

## 2024-04-23 ENCOUNTER — Other Ambulatory Visit: Payer: Self-pay | Admitting: *Deleted

## 2024-04-23 DIAGNOSIS — M1611 Unilateral primary osteoarthritis, right hip: Secondary | ICD-10-CM

## 2024-04-23 MED ORDER — MELOXICAM 15 MG PO TABS: 15.0000 mg | ORAL_TABLET | Freq: Every day | ORAL | 0 refills | Status: DC | PRN

## 2024-05-05 ENCOUNTER — Encounter: Payer: Self-pay | Admitting: Family Medicine

## 2024-05-06 ENCOUNTER — Ambulatory Visit: Payer: Self-pay

## 2024-05-06 NOTE — Telephone Encounter (Signed)
 Patient scheduled tomorrow with whitney crain, PA

## 2024-05-06 NOTE — Telephone Encounter (Signed)
 Patient unable to come in today for an appointment due to being busy at work He states he will call us  back later or would like a call from the office about setting up an appointment after today.  FYI Only or Action Required?: Action required by provider: request for appointment.  Patient was last seen in primary care on 01/28/2024 by Alvan Dorothyann BIRCH, MD.  Called Nurse Triage reporting Back Pain.  Symptoms began about a month ago but gotten worse.  Interventions attempted: Rest, hydration, or home remedies.  Symptoms are: gradually worsening.  Triage Disposition: See HCP Within 4 Hours (Or PCP Triage)  Patient/caregiver understands and will follow disposition?: No, wishes to speak with PCP           Copied from CRM #[id number]. Topic: Clinical - Red Word Triage >> May 06, 2024  8:18 AM Larissa RAMAN wrote: [facility] that prompted transfer to Nurse Triage: lower back pain Reason for Disposition  [1] Pain radiates into the thigh or further down the leg AND [2] both legs  Answer Assessment - Initial Assessment Questions States it is off and on pain State that it is mild at this time during triage--states pain isnt really in his legs at this time very much during triage  Patient is advised that the recommendation is to be seen within the next four hours. Patient states he cannot come in today for an appointment due to being busy at work  He is advised about Urgent Cares that are open late as well Patient getting ready to go to work during triage He states he will call us  back later or would like a call from the office about setting up an appointment. Patient is advised to call us  back if anything changes or with any further questions/concerns. Patient is advised that if anything worsens to go to the Emergency Room. Patient verbalized understanding.   1. ONSET: When did the pain begin? (e.g., minutes, hours, days)     Had some symptoms for about a month but hasn't gone away  and has gotten worse 2. LOCATION: Where does it hurt? (upper, mid or lower back)     Lower back 3. SEVERITY: How bad is the pain?  (e.g., Scale 1-10; mild, moderate, or severe)     Back is a 4 now but can shoot up to 10 sometimes 4. PATTERN: Is the pain constant? (e.g., yes, no; constant, intermittent)      ----- 5. RADIATION: Does the pain shoot into your legs or somewhere else?     Shoots into both legs front and back 6. CAUSE:  What do you think is causing the back pain?      unsure 7. BACK OVERUSE:  Any recent lifting of heavy objects, strenuous work or exercise?     Daily use 8. MEDICINES: What have you taken so far for the pain? (e.g., nothing, acetaminophen , NSAIDS)     motrin  9. NEUROLOGIC SYMPTOMS: Do you have any weakness, numbness, or problems with bowel/bladder control?     denies 10. OTHER SYMPTOMS: Do you have any other symptoms? (e.g., fever, abdomen pain, burning with urination, blood in urine)       denies  Protocols used: Back Pain-A-AH

## 2024-05-06 NOTE — Telephone Encounter (Signed)
 Attempted call to patient if this is an acute condition - patient can see any provider for acute issue and at time of call Dr. Alvan did have a 1pm opening today.

## 2024-05-06 NOTE — Telephone Encounter (Signed)
 Attempted call again. Again left a voice mail message that at the time of the call there was a 3:20pm opening with Dr. Alvan and to give us  a call if wanted to schedule this appointment.

## 2024-05-07 ENCOUNTER — Ambulatory Visit: Admitting: Urgent Care

## 2024-05-07 ENCOUNTER — Ambulatory Visit: Payer: Self-pay | Admitting: Urgent Care

## 2024-05-07 ENCOUNTER — Ambulatory Visit

## 2024-05-07 VITALS — BP 139/77 | HR 62 | Ht 70.0 in | Wt 248.0 lb

## 2024-05-07 DIAGNOSIS — M5441 Lumbago with sciatica, right side: Secondary | ICD-10-CM

## 2024-05-07 DIAGNOSIS — M5126 Other intervertebral disc displacement, lumbar region: Secondary | ICD-10-CM

## 2024-05-07 DIAGNOSIS — M5442 Lumbago with sciatica, left side: Secondary | ICD-10-CM

## 2024-05-07 MED ORDER — KETOROLAC TROMETHAMINE 60 MG/2ML IM SOLN
60.0000 mg | Freq: Once | INTRAMUSCULAR | Status: AC
  Administered 2024-05-07: 60 mg via INTRAMUSCULAR

## 2024-05-07 MED ORDER — HYDROCODONE-ACETAMINOPHEN 7.5-325 MG PO TABS: 1.0000 | ORAL_TABLET | Freq: Four times a day (QID) | ORAL | 0 refills | Status: DC | PRN

## 2024-05-07 MED ORDER — GABAPENTIN 100 MG PO CAPS: 100.0000 mg | ORAL_CAPSULE | Freq: Three times a day (TID) | ORAL | 0 refills | Status: DC

## 2024-05-07 NOTE — Telephone Encounter (Signed)
 Patient scheduled.

## 2024-05-07 NOTE — Progress Notes (Signed)
 Established Patient Office Visit  Subjective:  Patient ID: Dustin Kennedy, male    DOB: 03/09/1966  Age: 58 y.o. MRN: [id number]  Chief Complaint  Patient presents with   Back Pain    Back Pain    Discussed the use of AI scribe software for clinical note transcription with the patient, who gave verbal consent to proceed.  History of Present Illness   Dustin Kennedy is a 58 year old male who presents with lower back pain and bilateral leg pain.  The pain began one month ago and has been constant, with varying intensity. It is described as a sharp, shooting pain that worsens with movements such as turning the body or getting out of a chair. The pain is located in the lower back and radiates bilaterally down the legs, primarily affecting the back of the legs down to the calves, with recent involvement of the anterior thighs.  The pain is described as a 'burning shock-like' sensation, reaching a severity of ten out of ten, causing him to 'catch his breath'. He has attempted to manage the pain with heat and Motrin , but reports minimal relief. Movements such as bending, twisting, and turning exacerbate the pain, though it can also occur spontaneously.  No loss of sensation in the legs or feet, and no pain extending into the feet or toes. He reports a feeling of weakness and shuffling his feet, which he attributes to trying to avoid triggering the pain. No bowel changes, urgency, or incontinence, and no numbness, tingling, or paresthesias in the saddle, groin, or buttock area.  He mentions urinary frequency and urgency, but notes that these symptoms predate the onset of his back pain and have been previously treated.       Patient Active Problem List   Diagnosis Date Noted   Acute superficial venous thrombosis of lower extremity, right 08/26/2022   Primary osteoarthritis of right hip 08/26/2022   Lumbar radiculopathy, right 08/26/2022   Tinnitus of both ears 09/22/2020   Chronic neck pain  03/23/2020   Migraine headache 03/23/2020   Foraminal stenosis of cervical region 02/29/2020   OSA (obstructive sleep apnea) 02/16/2020   Memory difficulties 02/16/2020   Sebaceous cyst back of neck 06/23/2017   Osteoarthritis of left acromioclavicular joint 08/12/2012   Obesity (BMI 30-39.9) 07/31/2012   Rotator cuff syndrome of left shoulder 07/31/2012   Venous insufficiency (chronic) (peripheral) 04/20/2012   Hypogonadism in male 02/05/2010   WEIGHT GAIN 10/20/2009   Past Medical History:  Diagnosis Date   Hyperlipidemia    Hypertension    Past Surgical History:  Procedure Laterality Date   lipomas removal     removal X 4   LT shoulder surgery  1997   for bone spurs   Social History   Tobacco Use   Smoking status: Never   Smokeless tobacco: Never  Substance Use Topics   Alcohol use: Yes    Comment: 1-4 drinks per month or less   Drug use: No      ROS: as noted in HPI  Objective:     BP 139/77   Pulse 62   Ht 5' 10 (1.778 m)   Wt 248 lb (112.5 kg)   SpO2 98%   BMI 35.58 kg/m  BP Readings from Last 3 Encounters:  05/07/24 139/77  01/28/24 126/84  07/31/23 135/76   Wt Readings from Last 3 Encounters:  05/07/24 248 lb (112.5 kg)  01/28/24 252 lb 1.3 oz (114.3 kg)  07/31/23 257  lb (116.6 kg)      Physical Exam Vitals and nursing note reviewed. Exam conducted with a chaperone present.  Constitutional:      Appearance: Normal appearance. He is not ill-appearing, toxic-appearing or diaphoretic.     Comments: Pt sitting guarded on exam table  HENT:     Head: Normocephalic.  Cardiovascular:     Rate and Rhythm: Normal rate.  Pulmonary:     Effort: Pulmonary effort is normal. No respiratory distress.  Musculoskeletal:        General: Tenderness present. No swelling or signs of injury.     Lumbar back: Bony tenderness present. No swelling or spasms. Decreased range of motion. Positive right straight leg raise test and positive left straight leg raise  test.       Back:     Right lower leg: No edema.     Left lower leg: No edema.     Comments: Normal reflexes and strength bilaterally  Neurological:     General: No focal deficit present.     Mental Status: He is alert and oriented to person, place, and time.     Sensory: No sensory deficit.     Motor: No weakness.     Coordination: Coordination normal.     Gait: Gait normal.     Deep Tendon Reflexes: Reflexes normal.  Psychiatric:        Mood and Affect: Mood normal.        Behavior: Behavior normal.      No results found for any visits on 05/07/24.  Last CBC Lab Results  Component Value Date   WBC 6.2 01/28/2024   HGB 17.1 01/28/2024   HCT 50.3 01/28/2024   MCV 92 01/28/2024   MCH 31.1 01/28/2024   RDW 12.6 01/28/2024   PLT 259 01/28/2024   Last metabolic panel Lab Results  Component Value Date   GLUCOSE 109 (H) 07/31/2023   NA 141 07/31/2023   K 4.3 07/31/2023   CL 107 (H) 07/31/2023   CO2 22 07/31/2023   BUN 20 07/31/2023   CREATININE 0.97 07/31/2023   EGFR 90 07/31/2023   CALCIUM 9.5 07/31/2023   PROT 6.8 07/31/2023   ALBUMIN 4.5 07/31/2023   LABGLOB 2.3 07/31/2023   BILITOT 0.8 07/31/2023   ALKPHOS 84 07/31/2023   AST 35 07/31/2023   ALT 40 07/31/2023      The 10-year ASCVD risk score (Arnett DK, et al., 2019) is: 9.7%  Assessment & Plan:  Acute midline low back pain with bilateral sciatica -     DG Lumbar Spine Complete; Future -     HYDROcodone -Acetaminophen ; Take 1 tablet by mouth every 6 (six) hours as needed for moderate pain (pain score 4-6) or severe pain (pain score 7-10).  Dispense: 15 tablet; Refill: 0 -     Gabapentin; Take 1 capsule (100 mg total) by mouth 3 (three) times daily.  Dispense: 30 capsule; Refill: 0 -     Ketorolac Tromethamine  Severe lumbar pain midline, around L5. NO red flag s/sx. Did discuss that if any red flag sx occur (saddle anesthesia, incontinence B/B) to head to ER. Tx plan outlined above. Get stat xray, call  with results and if additional imaging (MRI) indicated. Pain management for now.   No follow-ups on file.   Benton LITTIE Gave, PA

## 2024-05-07 NOTE — Patient Instructions (Signed)
 Go to [address] for xray.  Take hydrocodone  as needed for moderate to severe pain. Take gabapentin as needed for nerve pain, up to three times daily. Both medications can cause drowsiness so take with caution.  If you develop numbness in the groin, incontinence of bowel or bladder, or severe worsening pain, please go to the ER.  Please do the exercises attached to this form.  We will contact you via mychart with results. Do no take your meloxicam  this evening due to the IM injection given in office

## 2024-05-07 NOTE — Telephone Encounter (Signed)
 He definitely will need an acute appointment.  But if he is not able to get in then just recommend that he at least maximize ibuprofen  800 mg 3 times a day or 2 Aleve every 12 hours.  Use heat and ice to work on gentle stretches avoid sitting too long.  Definitely avoid any heavy lifting.  Avoid doing a lot of flexion so just just again gentle stretches

## 2024-05-10 ENCOUNTER — Ambulatory Visit: Payer: Self-pay | Admitting: Urgent Care

## 2024-05-10 ENCOUNTER — Ambulatory Visit (HOSPITAL_COMMUNITY)
Admission: RE | Admit: 2024-05-10 | Discharge: 2024-05-10 | Disposition: A | Discharge status: Home / Self Care | Source: Ambulatory Visit | Attending: Urgent Care | Admitting: Urgent Care

## 2024-05-10 DIAGNOSIS — M5441 Lumbago with sciatica, right side: Secondary | ICD-10-CM

## 2024-05-10 DIAGNOSIS — M5442 Lumbago with sciatica, left side: Secondary | ICD-10-CM | POA: Insufficient documentation

## 2024-05-10 DIAGNOSIS — M48062 Spinal stenosis, lumbar region with neurogenic claudication: Secondary | ICD-10-CM

## 2024-05-11 NOTE — Telephone Encounter (Signed)
 The patient is requesting an update. He has not been contacted for scheduling an appointment at Dr. Andrey office. Thanks in advance.

## 2024-05-18 MED ORDER — GABAPENTIN 100 MG PO CAPS: 100.0000 mg | ORAL_CAPSULE | Freq: Three times a day (TID) | ORAL | 0 refills | Status: DC

## 2024-05-19 ENCOUNTER — Telehealth: Payer: Self-pay | Admitting: Family Medicine

## 2024-05-19 NOTE — Telephone Encounter (Signed)
 Please call patient and see if he is can get his flu shot done this year were happy to provide it here if he wants to get done in his pharmacy find it is fine just let me know or either get him scheduled.  He normally gets it.

## 2024-06-16 ENCOUNTER — Other Ambulatory Visit: Payer: Self-pay | Admitting: *Deleted

## 2024-06-16 DIAGNOSIS — M5441 Lumbago with sciatica, right side: Secondary | ICD-10-CM

## 2024-06-16 MED ORDER — GABAPENTIN 100 MG PO CAPS: 100.0000 mg | ORAL_CAPSULE | Freq: Three times a day (TID) | ORAL | 0 refills | Status: DC

## 2024-06-27 ENCOUNTER — Other Ambulatory Visit: Payer: Self-pay | Admitting: Family Medicine

## 2024-06-30 ENCOUNTER — Encounter (INDEPENDENT_AMBULATORY_CARE_PROVIDER_SITE_OTHER): Payer: Self-pay

## 2024-06-30 ENCOUNTER — Ambulatory Visit: Admitting: Family Medicine

## 2024-06-30 ENCOUNTER — Encounter: Payer: Self-pay | Admitting: Family Medicine

## 2024-06-30 VITALS — BP 132/83 | HR 60 | Ht 70.0 in | Wt 246.1 lb

## 2024-06-30 DIAGNOSIS — E291 Testicular hypofunction: Secondary | ICD-10-CM

## 2024-06-30 DIAGNOSIS — G4733 Obstructive sleep apnea (adult) (pediatric): Secondary | ICD-10-CM

## 2024-06-30 DIAGNOSIS — H9193 Unspecified hearing loss, bilateral: Secondary | ICD-10-CM

## 2024-06-30 DIAGNOSIS — G43009 Migraine without aura, not intractable, without status migrainosus: Secondary | ICD-10-CM

## 2024-06-30 DIAGNOSIS — E785 Hyperlipidemia, unspecified: Secondary | ICD-10-CM | POA: Insufficient documentation

## 2024-06-30 MED ORDER — TESTOSTERONE 50 MG/5GM (1%) TD GEL: 10.0000 g | Freq: Every day | TRANSDERMAL | 5 refills | Status: AC

## 2024-06-30 MED ORDER — ELETRIPTAN HYDROBROMIDE 20 MG PO TABS: 20.0000 mg | ORAL_TABLET | ORAL | 11 refills | Status: DC | PRN

## 2024-06-30 NOTE — Assessment & Plan Note (Signed)
 No on CPAP for years. Plan to retest in 2026, will likely meet deductible after surgery.

## 2024-06-30 NOTE — Assessment & Plan Note (Signed)
Due to recheck lipids. 
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
## 2024-06-30 NOTE — Addendum Note (Signed)
 Addended by: Terrisha Lopata D on: 06/30/2024 10:56 AM   Modules accepted: Orders

## 2024-07-01 ENCOUNTER — Ambulatory Visit: Payer: Self-pay | Admitting: Family Medicine

## 2024-07-01 LAB — LIPID PANEL
Chol/HDL Ratio: 3.3 ratio (ref 0.0–5.0)
Cholesterol, Total: 176 mg/dL (ref 100–199)
HDL: 53 mg/dL (ref >39)
LDL Chol Calc (NIH): 105 mg/dL — ABNORMAL HIGH (ref 0–99)
Triglycerides: 100 mg/dL (ref 0–149)
VLDL Cholesterol Cal: 18 mg/dL (ref 5–40)

## 2024-07-01 LAB — CMP14+EGFR
ALT: 27 IU/L (ref 0–44)
AST: 23 IU/L (ref 0–40)
Albumin: 4.4 g/dL (ref 3.8–4.9)
Alkaline Phosphatase: 87 IU/L (ref 47–123)
BUN/Creatinine Ratio: 20 (ref 9–20)
BUN: 20 mg/dL (ref 6–24)
Bilirubin Total: 0.7 mg/dL (ref 0.0–1.2)
CO2: 20 mmol/L (ref 20–29)
Calcium: 9.6 mg/dL (ref 8.7–10.2)
Chloride: 106 mmol/L (ref 96–106)
Creatinine, Ser: 1 mg/dL (ref 0.76–1.27)
Globulin, Total: 2.1 g/dL (ref 1.5–4.5)
Glucose: 111 mg/dL — ABNORMAL HIGH (ref 70–99)
Potassium: 4.5 mmol/L (ref 3.5–5.2)
Sodium: 140 mmol/L (ref 134–144)
Total Protein: 6.5 g/dL (ref 6.0–8.5)
eGFR: 87 mL/min/1.73 (ref >59)

## 2024-07-01 LAB — PSA: Prostate Specific Ag, Serum: 1.5 ng/mL (ref 0.0–4.0)

## 2024-07-01 LAB — TESTOSTERONE, FREE, TOTAL, SHBG
Sex Hormone Binding: 27.3 nmol/L (ref 19.3–76.4)
Testosterone, Free: 24.2 pg/mL — ABNORMAL HIGH (ref 7.2–24.0)
Testosterone: 720 ng/dL (ref 264–916)

## 2024-07-01 NOTE — Progress Notes (Signed)
 Hi Dustin Kennedy, metabolic panel overall looks good.  Total testosterone  looks great.  LDL cholesterol is up just a little bit but it does look better than it did last year so great work in bringing that down.  Prostate test is normal.

## 2024-07-16 ENCOUNTER — Other Ambulatory Visit: Payer: Self-pay | Admitting: Orthopedic Surgery

## 2024-07-20 ENCOUNTER — Encounter: Payer: Self-pay | Admitting: Family Medicine

## 2024-07-29 ENCOUNTER — Other Ambulatory Visit: Payer: Self-pay | Admitting: Family Medicine

## 2024-07-29 DIAGNOSIS — M1611 Unilateral primary osteoarthritis, right hip: Secondary | ICD-10-CM

## 2024-07-31 ENCOUNTER — Encounter: Payer: Self-pay | Admitting: Family Medicine

## 2024-07-31 DIAGNOSIS — E291 Testicular hypofunction: Secondary | ICD-10-CM

## 2024-08-02 ENCOUNTER — Other Ambulatory Visit: Payer: Self-pay

## 2024-08-02 ENCOUNTER — Other Ambulatory Visit (HOSPITAL_BASED_OUTPATIENT_CLINIC_OR_DEPARTMENT_OTHER): Payer: Self-pay

## 2024-08-02 MED ORDER — ELETRIPTAN HYDROBROMIDE 20 MG PO TABS
20.0000 mg | ORAL_TABLET | ORAL | 11 refills | Status: AC | PRN
  Filled 2024-08-02: qty 10, 30d supply, fill #0

## 2024-08-02 MED ORDER — TOPIRAMATE 25 MG PO TABS
25.0000 mg | ORAL_TABLET | Freq: Two times a day (BID) | ORAL | 0 refills | Status: AC
  Filled 2024-08-02: qty 180, 90d supply, fill #0

## 2024-08-03 ENCOUNTER — Other Ambulatory Visit (HOSPITAL_BASED_OUTPATIENT_CLINIC_OR_DEPARTMENT_OTHER): Payer: Self-pay

## 2024-08-03 ENCOUNTER — Encounter (HOSPITAL_BASED_OUTPATIENT_CLINIC_OR_DEPARTMENT_OTHER): Payer: Self-pay

## 2024-08-04 ENCOUNTER — Other Ambulatory Visit (HOSPITAL_BASED_OUTPATIENT_CLINIC_OR_DEPARTMENT_OTHER): Payer: Self-pay

## 2024-08-05 ENCOUNTER — Other Ambulatory Visit (HOSPITAL_BASED_OUTPATIENT_CLINIC_OR_DEPARTMENT_OTHER): Payer: Self-pay

## 2024-08-05 NOTE — Pre-Procedure Instructions (Signed)
 Surgical Instructions   Your procedure is scheduled on August 12, 2024. Report to [facility] Main Entrance A at 5:30 A.M., then check in with the Admitting office. Any questions or running late day of surgery: call [phone]  Questions prior to your surgery date: call [phone], Monday-Friday, 8am-4pm. If you experience any cold or flu symptoms such as cough, fever, chills, shortness of breath, etc. between now and your scheduled surgery, please notify us  at the above number.     Remember:  Do not eat after midnight the night before your surgery  You may drink clear liquids until 4:30am the morning of your surgery.   Clear liquids allowed are: Water, Non-Citrus Juices (without pulp), Carbonated Beverages, Clear Tea (no milk, honey, etc.), Black Coffee Only (NO MILK, CREAM OR POWDERED CREAMER of any kind), and Gatorade.  Patient Instructions  The night before surgery:  No food after midnight. ONLY clear liquids after midnight   The day of surgery (if you do NOT have diabetes):  Drink ONE (1) Pre-Surgery Clear Ensure by 4:30am the morning of surgery. Drink in one sitting. Do not sip.  This drink was given to you during your hospital  pre-op appointment visit.  Nothing else to drink after completing the  Pre-Surgery Clear Ensure.         If you have questions, please contact your surgeons office.     Take these medicines the morning of surgery with A SIP OF WATER : Gabapentin  (Neurontin ) Topiramate  (Topamax )   One week prior to surgery, STOP taking any Aspirin (unless otherwise instructed by your surgeon) Aleve, Naproxen, Ibuprofen , Motrin , Advil , Goody's, BC's, all herbal medications, fish oil, and non-prescription vitamins. This includes your Meloxicam  (MOBIC ).                     Do NOT Smoke (Tobacco/Vaping) for 24 hours prior to your procedure.  If you use a CPAP at night, you may bring your mask/headgear for your overnight stay.   You will be asked to  remove any contacts, glasses, piercing's, hearing aid's, dentures/partials prior to surgery. Please bring cases for these items if needed.    Your surgeon will determine if you are to be admitted or discharged the same day.  Patients discharged the day of surgery will not be allowed to drive home, and someone needs to stay with them for 24 hours.  SURGICAL WAITING ROOM VISITATION Patients may have no more than 2 support people in the waiting area - these visitors may rotate.   Pre-op nurse will coordinate an appropriate time for 2 ADULT support persons, who may not rotate, to accompany patient in pre-op.  Children under the age of 47 must have an adult with them who is not the patient and must remain in the main waiting area with an adult.  If the patient needs to stay at the hospital during part of their recovery, the visitor guidelines for inpatient rooms apply.  Please refer to the [facility] website for the visitor guidelines for any additional information.   If you received a COVID test during your pre-op visit  it is requested that you wear a mask when out in public, stay away from anyone that may not be feeling well and notify your surgeon if you develop symptoms. If you have been in contact with anyone that has tested positive in the last 10 days please notify you surgeon.      Pre-operative 4 CHG Bathing Instructions   You  can play a key role in reducing the risk of infection after surgery. Your skin needs to be as free of germs as possible. You can reduce the number of germs on your skin by washing with CHG (chlorhexidine  gluconate) soap before surgery. CHG is an antiseptic soap that kills germs and continues to kill germs even after washing.   DO NOT use if you have an allergy to chlorhexidine /CHG or antibacterial soaps. If your skin becomes reddened or irritated, stop using the CHG and notify one of our RNs at [phone].   Please shower with the CHG soap starting 4 days  before surgery using the following schedule:     Please keep in mind the following:  DO NOT shave, including legs and underarms, starting the day of your first shower.   You may shave your face at any point before/day of surgery.  Place clean sheets on your bed the day you start using CHG soap. Use a clean washcloth (not used since being washed) for each shower. DO NOT sleep with pets once you start using the CHG.   CHG Shower Instructions:  Wash your face and private area with normal soap. If you choose to wash your hair, wash first with your normal shampoo.  After you use shampoo/soap, rinse your hair and body thoroughly to remove shampoo/soap residue.  Turn the water OFF and apply  bottle of CHG soap to a CLEAN washcloth.  Apply CHG soap ONLY FROM YOUR NECK DOWN TO YOUR TOES (washing for 3-5 minutes)  DO NOT use CHG soap on face, private areas, open wounds, or sores.  Pay special attention to the area where your surgery is being performed.  If you are having back surgery, having someone wash your back for you may be helpful. Wait 2 minutes after CHG soap is applied, then you may rinse off the CHG soap.  Pat dry with a clean towel  Put on clean clothes/pajamas   If you choose to wear lotion, please use ONLY the CHG-compatible lotions that are listed below.  Additional instructions for the day of surgery:  If you choose, you may shower the morning of surgery with an antibacterial soap.  DO NOT APPLY any lotions, deodorants, cologne, or perfumes.   Do not bring valuables to the hospital. [facility] is not responsible for any belongings/valuables. Do not wear nail polish, gel polish, artificial nails, or any other type of covering on natural nails (fingers and toes) Do not wear jewelry or makeup Put on clean/comfortable clothes.  Please brush your teeth.  Ask your nurse before applying any prescription medications to the skin.     CHG Compatible Lotions   Aveeno Moisturizing  lotion  Cetaphil Moisturizing Cream  Cetaphil Moisturizing Lotion  Clairol Herbal Essence Moisturizing Lotion, Dry Skin  Clairol Herbal Essence Moisturizing Lotion, Extra Dry Skin  Clairol Herbal Essence Moisturizing Lotion, Normal Skin  Curel Age Defying Therapeutic Moisturizing Lotion with Alpha Hydroxy  Curel Extreme Care Body Lotion  Curel Soothing Hands Moisturizing Hand Lotion  Curel Therapeutic Moisturizing Cream, Fragrance-Free  Curel Therapeutic Moisturizing Lotion, Fragrance-Free  Curel Therapeutic Moisturizing Lotion, Original Formula  Eucerin Daily Replenishing Lotion  Eucerin Dry Skin Therapy Plus Alpha Hydroxy Crme  Eucerin Dry Skin Therapy Plus Alpha Hydroxy Lotion  Eucerin Original Crme  Eucerin Original Lotion  Eucerin Plus Crme Eucerin Plus Lotion  Eucerin TriLipid Replenishing Lotion  Keri Anti-Bacterial Hand Lotion  Keri Deep Conditioning Original Lotion Dry Skin Formula Softly Scented  Keri Deep Conditioning  Mikael Memory, Fragrance Free Sensitive Skin Formula  Keri Lotion Fast Absorbing Fragrance Free Sensitive Skin Formula  Keri Lotion Fast Absorbing Softly Scented Dry Skin Formula  Keri Original Lotion  Keri Skin Renewal Lotion Keri Silky Smooth Lotion  Keri Silky Smooth Sensitive Skin Lotion  Nivea Body Creamy Conditioning Oil  Nivea Body Extra Enriched Lotion  Nivea Body Original Lotion  Nivea Body Sheer Moisturizing Lotion Nivea Crme  Nivea Skin Firming Lotion  NutraDerm 30 Skin Lotion  NutraDerm Skin Lotion  NutraDerm Therapeutic Skin Cream  NutraDerm Therapeutic Skin Lotion  ProShield Protective Hand Cream  Provon moisturizing lotion  Please read over the following fact sheets that you were given.

## 2024-08-06 ENCOUNTER — Other Ambulatory Visit: Payer: Self-pay

## 2024-08-06 ENCOUNTER — Encounter (HOSPITAL_COMMUNITY): Payer: Self-pay

## 2024-08-06 ENCOUNTER — Encounter (HOSPITAL_COMMUNITY)
Admission: RE | Admit: 2024-08-06 | Discharge: 2024-08-06 | Disposition: A | Source: Ambulatory Visit | Attending: Orthopedic Surgery

## 2024-08-06 VITALS — BP 138/79 | HR 65 | Temp 98.4°F | Resp 18 | Ht 70.0 in | Wt 247.7 lb

## 2024-08-06 DIAGNOSIS — I10 Essential (primary) hypertension: Secondary | ICD-10-CM | POA: Insufficient documentation

## 2024-08-06 DIAGNOSIS — E669 Obesity, unspecified: Secondary | ICD-10-CM | POA: Diagnosis not present

## 2024-08-06 DIAGNOSIS — G4733 Obstructive sleep apnea (adult) (pediatric): Secondary | ICD-10-CM | POA: Diagnosis not present

## 2024-08-06 DIAGNOSIS — E785 Hyperlipidemia, unspecified: Secondary | ICD-10-CM | POA: Insufficient documentation

## 2024-08-06 DIAGNOSIS — M5416 Radiculopathy, lumbar region: Secondary | ICD-10-CM | POA: Insufficient documentation

## 2024-08-06 DIAGNOSIS — Z6835 Body mass index (BMI) 35.0-35.9, adult: Secondary | ICD-10-CM | POA: Diagnosis not present

## 2024-08-06 DIAGNOSIS — Z01818 Encounter for other preprocedural examination: Secondary | ICD-10-CM | POA: Insufficient documentation

## 2024-08-06 DIAGNOSIS — I8391 Asymptomatic varicose veins of right lower extremity: Secondary | ICD-10-CM | POA: Diagnosis not present

## 2024-08-06 HISTORY — DX: Sleep apnea, unspecified: G47.30

## 2024-08-06 LAB — CBC
HCT: 47.7 % (ref 39.0–52.0)
Hemoglobin: 16.4 g/dL (ref 13.0–17.0)
MCH: 30.8 pg (ref 26.0–34.0)
MCHC: 34.4 g/dL (ref 30.0–36.0)
MCV: 89.7 fL (ref 80.0–100.0)
Platelets: 360 K/uL (ref 150–400)
RBC: 5.32 MIL/uL (ref 4.22–5.81)
RDW: 12.1 % (ref 11.5–15.5)
WBC: 7.2 K/uL (ref 4.0–10.5)
nRBC: 0 % (ref 0.0–0.2)

## 2024-08-06 LAB — BASIC METABOLIC PANEL WITH GFR
Anion gap: 10 (ref 5–15)
BUN: 22 mg/dL — ABNORMAL HIGH (ref 6–20)
CO2: 24 mmol/L (ref 22–32)
Calcium: 9.5 mg/dL (ref 8.9–10.3)
Chloride: 106 mmol/L (ref 98–111)
Creatinine, Ser: 0.98 mg/dL (ref 0.61–1.24)
GFR, Estimated: >60 mL/min
Glucose, Bld: 105 mg/dL — ABNORMAL HIGH (ref 70–99)
Potassium: 4.4 mmol/L (ref 3.5–5.1)
Sodium: 139 mmol/L (ref 135–145)

## 2024-08-06 LAB — TYPE AND SCREEN
ABO/RH(D): O POS
Antibody Screen: NEGATIVE

## 2024-08-06 LAB — SURGICAL PCR SCREEN
MRSA, PCR: NEGATIVE
Staphylococcus aureus: NEGATIVE

## 2024-08-06 NOTE — Progress Notes (Signed)
 PCP - Dr. Dorothyann Byars Cardiologist - denies  PPM/ICD - n/a Device Orders -  Rep Notified -   Chest x-ray -  EKG - 08/06/2024 Stress Test - denies ECHO - possibly had one years ago [employer] days--normal Cardiac Cath - denies  Sleep Study - denies CPAP -   Fasting Blood Sugar - n/a  Checks Blood Sugar _____ times a day  Last dose of GLP1 agonist-  n/a GLP1 instructions:   Blood Thinner Instructions: n/a Aspirin Instructions:  ERAS Protcol - clears until 4:30 PRE-SURGERY Ensure or G2- Pre-surgery ensure provided.   COVID TEST- n/a   Patient denies shortness of breath, fever, cough and chest pain at PAT appointment  Pt hopeful to proceed with surgery, reports still waiting on insurance approval. Instructed by Dr. Andrey office to keep today's PAT appt.

## 2024-08-07 ENCOUNTER — Other Ambulatory Visit (HOSPITAL_BASED_OUTPATIENT_CLINIC_OR_DEPARTMENT_OTHER): Payer: Self-pay

## 2024-08-07 MED ORDER — TESTOSTERONE 50 MG/5GM (1%) TD GEL
10.0000 g | Freq: Every day | TRANSDERMAL | 5 refills | Status: AC
  Filled 2024-08-07: qty 300, 30d supply, fill #0

## 2024-08-09 NOTE — Progress Notes (Signed)
 Anesthesia Chart Review:  Case: [id number] Date/Time: 08/12/24 0715   Procedure: TRANSFORAMINAL LUMBAR INTERBODY FUSION (TLIF) WITH PEDICLE SCREW FIXATION 1 LEVEL (Left) - LEFT-SIDED LUMBAR 3 - LUMBAR 4 TRANSFORAMINAL LUMBAR INTERBODY FUSION AND DECOMPRESSION WITH INSTRUMENTATION AND ALLOGRAFT   Anesthesia type: General   Pre-op diagnosis: LUMBAR RADICULOPATHY   [facility]: [facility] ROOM 05 / [facility] OR   Surgeons: Beuford Anes, MD       DISCUSSION: Patient is a 59 year old male scheduled for the above procedure.  History includes never smoker, HTN, HLD, varicose veins (s/p RLE GSV laser ablation 01/05/2018), OSA (mild 10/2016, does not use CPAP), migraines. BMI is consistent with obesity.  Longtime PCP is Alvan Dorothyann BIRCH, MD, last visit 06/30/2024 for follow-up chronic medical conditions. TC 176, HDL 53, LDL 105, Chol/HDL Ratio 3.3 (1/2 Avg. Risk). On Topamax  for migraine prophylaxis. Mild OSA in 2018. He has not used CPAP in years. Plan to retest in 2026. A1c 5.6% 07/31/2023. On gabapentin  and doing PT for lumbar radiculopathy per Dr. Beuford with potential plans for surgery. Six month follow-up planned.   Preoperative EKG and labs noted. He denied chest pain, SOB, cough, fever. Anesthesia team to evaluate on the day of surgery.   VS: BP 138/79   Pulse 65   Temp 36.9 C   Resp 18   Ht 5' 10 (1.778 m)   Wt 112.4 kg   SpO2 97%   BMI 35.54 kg/m   PROVIDERS: Alvan Dorothyann BIRCH, MD is PCP    LABS: Labs reviewed: Acceptable for surgery. LFTs normal 06/30/2024. (all labs ordered are listed, but only abnormal results are displayed)  Labs Reviewed  BASIC METABOLIC PANEL WITH GFR - Abnormal; Notable for the following components:      Result Value   Glucose, Bld 105 (*)    BUN 22 (*)    All other components within normal limits  SURGICAL PCR SCREEN  CBC  TYPE AND SCREEN    Home Sleep Study 11/14/2016: WT 240 lb IMPRESSIONS - Mild obstructive sleep apnea occurred during this study  (AHI = 8.6/h). - No significant central sleep apnea occurred during this study (CAI = 0.2/h). - Moderate oxygen desaturation was noted during this study (Min O2 = 85%, Mean saturation 95%). - Patient snored. RECOMMENDATIONS - Treatment options for mild OSA based on clinical judgment. Weight loss and sleep off flat of back may help. On an individual basis, CPAP, fitted oral appliance or surgical evaluation may be appropriate...   IMAGES: MRI L-spine 05/10/2024: IMPRESSION: 1. Moderate to severe multifactorial spinal stenosis at L3-4 with moderate lateral recess and mild foraminal narrowing bilaterally. 2. Mild multifactorial spinal stenosis at L4-5 with mild lateral recess narrowing bilaterally. 3. Chronic spondylosis at L5-S1 with mild right lateral recess and moderate osseous foraminal narrowing bilaterally.    EKG: Normal sinus rhythm Inferior infarct , age undetermined Abnormal ECG No previous ECGs available Confirmed by Loni Rushing ([id number]) on 08/07/2024 5:47:33 PM    CV: Possibly had a remote echo years ago when in the [employer].   Past Medical History:  Diagnosis Date   Hyperlipidemia    Hypertension    Sleep apnea    mild 2018    Past Surgical History:  Procedure Laterality Date   ENDOVENOUS ABLATION SAPHENOUS VEIN W/ LASER Right 01/05/2018   lipomas removal     removal X 4   LT shoulder surgery  07/30/1995   for bone spurs    MEDICATIONS:  Ascorbic Acid (VITAMIN C PO)  eletriptan  (RELPAX ) 20 MG tablet   gabapentin  (NEURONTIN ) 300 MG capsule   meloxicam  (MOBIC ) 15 MG tablet   Multiple Vitamin (MULTIVITAMIN WITH MINERALS) TABS tablet   testosterone  (ANDROGEL ) 50 MG/5GM (1%) GEL   testosterone  (ANDROGEL ) 50 MG/5GM (1%) GEL   topiramate  (TOPAMAX ) 25 MG tablet   No current facility-administered medications for this encounter.    Isaiah Ruder, PA-C Surgical Short Stay/Anesthesiology [facility] [place] Phone [phone] [employer] Phone [phone] 08/10/2024 9:58  AM

## 2024-08-10 ENCOUNTER — Encounter (HOSPITAL_COMMUNITY): Payer: Self-pay

## 2024-08-10 NOTE — Anesthesia Preprocedure Evaluation (Signed)
"                                    Anesthesia Evaluation  Patient identified by MRN, date of birth, ID band Patient awake    Reviewed: Allergy & Precautions, NPO status , Patient's Chart, lab work & pertinent test results  History of Anesthesia Complications Negative for: history of anesthetic complications  Airway Mallampati: II  TM Distance: >3 FB Neck ROM: Full    Dental  (+) Dental Advisory Given, Teeth Intact   Pulmonary sleep apnea    Pulmonary exam normal        Cardiovascular hypertension, Normal cardiovascular exam     Neuro/Psych  Headaches  Tinnitus   negative psych ROS   GI/Hepatic negative GI ROS, Neg liver ROS,,,  Endo/Other   Obesity   Renal/GU negative Renal ROS     Musculoskeletal  (+) Arthritis , Osteoarthritis,    Abdominal   Peds  Hematology negative hematology ROS (+)   Anesthesia Other Findings   Reproductive/Obstetrics                              Anesthesia Physical Anesthesia Plan  ASA: 2  Anesthesia Plan: General   Post-op Pain Management: Tylenol  PO (pre-op)*, Dilaudid IV and Ketamine  IV*   Induction: Intravenous  PONV Risk Score and Plan: 2 and Treatment may vary due to age or medical condition, Ondansetron , Dexamethasone  and Midazolam   Airway Management Planned: Oral ETT  Additional Equipment: None  Intra-op Plan:   Post-operative Plan: Extubation in OR  Informed Consent: I have reviewed the patients History and Physical, chart, labs and discussed the procedure including the risks, benefits and alternatives for the proposed anesthesia with the patient or authorized representative who has indicated his/her understanding and acceptance.     Dental advisory given  Plan Discussed with: CRNA and Anesthesiologist  Anesthesia Plan Comments: (PAT note written 08/10/2024 by Vaden Becherer, PA-C.  )         Anesthesia Quick Evaluation  "

## 2024-08-11 ENCOUNTER — Other Ambulatory Visit (HOSPITAL_BASED_OUTPATIENT_CLINIC_OR_DEPARTMENT_OTHER): Payer: Self-pay

## 2024-08-12 ENCOUNTER — Encounter (HOSPITAL_COMMUNITY): Payer: Self-pay | Admitting: Vascular Surgery

## 2024-08-12 ENCOUNTER — Other Ambulatory Visit (HOSPITAL_BASED_OUTPATIENT_CLINIC_OR_DEPARTMENT_OTHER): Payer: Self-pay

## 2024-08-12 ENCOUNTER — Ambulatory Visit (HOSPITAL_COMMUNITY): Payer: Self-pay | Admitting: Vascular Surgery

## 2024-08-12 ENCOUNTER — Ambulatory Visit (HOSPITAL_COMMUNITY)
Admission: RE | Disposition: A | Discharge status: Home / Self Care | Payer: Self-pay | Source: Home / Self Care | Attending: Orthopedic Surgery

## 2024-08-12 ENCOUNTER — Ambulatory Visit (HOSPITAL_COMMUNITY)

## 2024-08-12 ENCOUNTER — Encounter (HOSPITAL_COMMUNITY): Payer: Self-pay | Admitting: Orthopedic Surgery

## 2024-08-12 ENCOUNTER — Other Ambulatory Visit: Payer: Self-pay

## 2024-08-12 ENCOUNTER — Observation Stay (HOSPITAL_COMMUNITY)
Admission: RE | Admit: 2024-08-12 | Discharge: 2024-08-13 | Disposition: A | Discharge status: Home / Self Care | Attending: Orthopedic Surgery | Admitting: Orthopedic Surgery

## 2024-08-12 ENCOUNTER — Encounter (HOSPITAL_COMMUNITY)
Admission: RE | Disposition: A | Discharge status: Home / Self Care | Payer: Self-pay | Source: Home / Self Care | Attending: Orthopedic Surgery

## 2024-08-12 DIAGNOSIS — M48061 Spinal stenosis, lumbar region without neurogenic claudication: Secondary | ICD-10-CM | POA: Diagnosis not present

## 2024-08-12 DIAGNOSIS — Z79899 Other long term (current) drug therapy: Secondary | ICD-10-CM | POA: Diagnosis not present

## 2024-08-12 DIAGNOSIS — I10 Essential (primary) hypertension: Secondary | ICD-10-CM | POA: Insufficient documentation

## 2024-08-12 DIAGNOSIS — M4316 Spondylolisthesis, lumbar region: Secondary | ICD-10-CM | POA: Insufficient documentation

## 2024-08-12 DIAGNOSIS — M79609 Pain in unspecified limb: Secondary | ICD-10-CM | POA: Diagnosis present

## 2024-08-12 DIAGNOSIS — M5416 Radiculopathy, lumbar region: Secondary | ICD-10-CM | POA: Diagnosis not present

## 2024-08-12 DIAGNOSIS — M4716 Other spondylosis with myelopathy, lumbar region: Principal | ICD-10-CM

## 2024-08-12 HISTORY — PX: TRANSFORAMINAL LUMBAR INTERBODY FUSION (TLIF) WITH PEDICLE SCREW FIXATION 1 LEVEL: SHX6141

## 2024-08-12 LAB — ABO/RH: ABO/RH(D): O POS

## 2024-08-12 SURGERY — ANTERIOR LUMBAR FUSION 1 LEVEL
Anesthesia: General

## 2024-08-12 MED ORDER — MIDAZOLAM HCL 2 MG/2ML IJ SOLN
INTRAMUSCULAR | Status: AC
  Filled 2024-08-12: qty 2

## 2024-08-12 MED ORDER — PHENOL 1.4 % MT LIQD: 1.0000 | OROMUCOSAL | Status: DC | PRN

## 2024-08-12 MED ORDER — ADULT MULTIVITAMIN W/MINERALS CH
1.0000 | ORAL_TABLET | Freq: Every morning | ORAL | Status: DC
  Administered 2024-08-13: 1 via ORAL
  Filled 2024-08-12: qty 1

## 2024-08-12 MED ORDER — ORAL CARE MOUTH RINSE: 15.0000 mL | Freq: Once | OROMUCOSAL | Status: AC

## 2024-08-12 MED ORDER — MENTHOL 3 MG MT LOZG: 1.0000 | LOZENGE | OROMUCOSAL | Status: DC | PRN

## 2024-08-12 MED ORDER — LACTATED RINGERS IV SOLN: INTRAVENOUS | Status: DC

## 2024-08-12 MED ORDER — BUPIVACAINE-EPINEPHRINE (PF) 0.25% -1:200000 IJ SOLN
INTRAMUSCULAR | Status: AC
  Filled 2024-08-12: qty 30

## 2024-08-12 MED ORDER — PHENYLEPHRINE HCL-NACL 20-0.9 MG/250ML-% IV SOLN
INTRAVENOUS | Status: AC
  Filled 2024-08-12: qty 250

## 2024-08-12 MED ORDER — PANTOPRAZOLE SODIUM 40 MG IV SOLR
40.0000 mg | Freq: Every day | INTRAVENOUS | Status: DC
  Administered 2024-08-12: 40 mg via INTRAVENOUS
  Filled 2024-08-12: qty 10

## 2024-08-12 MED ORDER — BISACODYL 5 MG PO TBEC: 5.0000 mg | DELAYED_RELEASE_TABLET | Freq: Every day | ORAL | Status: DC | PRN

## 2024-08-12 MED ORDER — ZOLPIDEM TARTRATE 5 MG PO TABS: 5.0000 mg | ORAL_TABLET | Freq: Every evening | ORAL | Status: DC | PRN

## 2024-08-12 MED ORDER — TESTOSTERONE 50 MG/5GM (1%) TD GEL: 10.0000 g | Freq: Every day | TRANSDERMAL | Status: DC

## 2024-08-12 MED ORDER — HYDROCODONE-ACETAMINOPHEN 5-325 MG PO TABS: 1.0000 | ORAL_TABLET | ORAL | Status: DC | PRN

## 2024-08-12 MED ORDER — MORPHINE SULFATE (PF) 2 MG/ML IV SOLN
2.0000 mg | INTRAVENOUS | Status: DC | PRN
  Administered 2024-08-12: 2 mg via INTRAVENOUS
  Filled 2024-08-12: qty 1

## 2024-08-12 MED ORDER — PROPOFOL 10 MG/ML IV BOLUS
INTRAVENOUS | Status: AC
  Filled 2024-08-12: qty 20

## 2024-08-12 MED ORDER — GLYCOPYRROLATE PF 0.2 MG/ML IJ SOSY
PREFILLED_SYRINGE | INTRAMUSCULAR | Status: DC | PRN
  Administered 2024-08-12: .2 mg via INTRAVENOUS

## 2024-08-12 MED ORDER — ACETAMINOPHEN 500 MG PO TABS
1000.0000 mg | ORAL_TABLET | Freq: Once | ORAL | Status: AC
  Administered 2024-08-12: 1000 mg via ORAL
  Filled 2024-08-12: qty 2

## 2024-08-12 MED ORDER — SODIUM CHLORIDE 0.9 % IV SOLN
250.0000 mL | INTRAVENOUS | Status: DC
  Administered 2024-08-12: 250 mL via INTRAVENOUS

## 2024-08-12 MED ORDER — SENNOSIDES-DOCUSATE SODIUM 8.6-50 MG PO TABS
1.0000 | ORAL_TABLET | Freq: Every evening | ORAL | Status: DC | PRN
  Administered 2024-08-12: 1 via ORAL
  Filled 2024-08-12: qty 1

## 2024-08-12 MED ORDER — GLYCOPYRROLATE PF 0.2 MG/ML IJ SOSY
PREFILLED_SYRINGE | INTRAMUSCULAR | Status: AC
  Filled 2024-08-12: qty 1

## 2024-08-12 MED ORDER — OXYCODONE-ACETAMINOPHEN 5-325 MG PO TABS
1.0000 | ORAL_TABLET | ORAL | 0 refills | Status: AC | PRN
  Filled 2024-08-12: qty 30, 3d supply, fill #0

## 2024-08-12 MED ORDER — FENTANYL CITRATE (PF) 100 MCG/2ML IJ SOLN
INTRAMUSCULAR | Status: DC | PRN
  Administered 2024-08-12: 100 ug via INTRAVENOUS

## 2024-08-12 MED ORDER — EPHEDRINE 5 MG/ML INJ
INTRAVENOUS | Status: AC
  Filled 2024-08-12: qty 5

## 2024-08-12 MED ORDER — FENTANYL CITRATE (PF) 100 MCG/2ML IJ SOLN
INTRAMUSCULAR | Status: AC
  Filled 2024-08-12: qty 2

## 2024-08-12 MED ORDER — KETAMINE HCL 50 MG/5ML IJ SOSY
PREFILLED_SYRINGE | INTRAMUSCULAR | Status: AC
  Filled 2024-08-12: qty 5

## 2024-08-12 MED ORDER — OXYCODONE HCL 5 MG/5ML PO SOLN
5.0000 mg | Freq: Once | ORAL | Status: AC | PRN
  Administered 2024-08-12: 5 mg via ORAL

## 2024-08-12 MED ORDER — OXYCODONE-ACETAMINOPHEN 5-325 MG PO TABS
1.0000 | ORAL_TABLET | ORAL | Status: DC | PRN
  Administered 2024-08-12 – 2024-08-13 (×5): 2 via ORAL
  Filled 2024-08-12 (×5): qty 2

## 2024-08-12 MED ORDER — KETAMINE HCL 10 MG/ML IJ SOLN
INTRAMUSCULAR | Status: DC | PRN
  Administered 2024-08-12: 20 mg via INTRAVENOUS
  Administered 2024-08-12: 30 mg via INTRAVENOUS

## 2024-08-12 MED ORDER — PHENYLEPHRINE 80 MCG/ML (10ML) SYRINGE FOR IV PUSH (FOR BLOOD PRESSURE SUPPORT)
PREFILLED_SYRINGE | INTRAVENOUS | Status: DC | PRN
  Administered 2024-08-12 (×4): 80 ug via INTRAVENOUS

## 2024-08-12 MED ORDER — ROCURONIUM BROMIDE 10 MG/ML (PF) SYRINGE
PREFILLED_SYRINGE | INTRAVENOUS | Status: DC | PRN
  Administered 2024-08-12 (×4): 10 mg via INTRAVENOUS
  Administered 2024-08-12: 70 mg via INTRAVENOUS

## 2024-08-12 MED ORDER — THROMBIN 20000 UNITS EX KIT
PACK | CUTANEOUS | Status: DC | PRN
  Administered 2024-08-12: 20 mL via TOPICAL

## 2024-08-12 MED ORDER — BUPIVACAINE-EPINEPHRINE 0.25% -1:200000 IJ SOLN
INTRAMUSCULAR | Status: DC | PRN
  Administered 2024-08-12: 8 mL

## 2024-08-12 MED ORDER — AMISULPRIDE (ANTIEMETIC) 5 MG/2ML IV SOLN: 10.0000 mg | Freq: Once | INTRAVENOUS | Status: DC | PRN

## 2024-08-12 MED ORDER — DOCUSATE SODIUM 100 MG PO CAPS
100.0000 mg | ORAL_CAPSULE | Freq: Two times a day (BID) | ORAL | Status: DC
  Administered 2024-08-12: 100 mg via ORAL
  Filled 2024-08-12: qty 1

## 2024-08-12 MED ORDER — EPHEDRINE SULFATE-NACL 50-0.9 MG/10ML-% IV SOSY
PREFILLED_SYRINGE | INTRAVENOUS | Status: DC | PRN
  Administered 2024-08-12: 5 mg via INTRAVENOUS

## 2024-08-12 MED ORDER — SUGAMMADEX SODIUM 200 MG/2ML IV SOLN
INTRAVENOUS | Status: DC | PRN
  Administered 2024-08-12: 400 mg via INTRAVENOUS

## 2024-08-12 MED ORDER — ACETAMINOPHEN 650 MG RE SUPP: 650.0000 mg | RECTAL | Status: DC | PRN

## 2024-08-12 MED ORDER — CEFAZOLIN SODIUM-DEXTROSE 2-4 GM/100ML-% IV SOLN
2.0000 g | INTRAVENOUS | Status: AC
  Administered 2024-08-12: 2 g via INTRAVENOUS
  Filled 2024-08-12: qty 100

## 2024-08-12 MED ORDER — ALUM & MAG HYDROXIDE-SIMETH 200-200-20 MG/5ML PO SUSP: 30.0000 mL | Freq: Four times a day (QID) | ORAL | Status: DC | PRN

## 2024-08-12 MED ORDER — METHOCARBAMOL 1000 MG/10ML IJ SOLN: 500.0000 mg | Freq: Four times a day (QID) | INTRAMUSCULAR | Status: DC | PRN

## 2024-08-12 MED ORDER — PROPOFOL 10 MG/ML IV BOLUS
INTRAVENOUS | Status: DC | PRN
  Administered 2024-08-12: 200 mg via INTRAVENOUS

## 2024-08-12 MED ORDER — 0.9 % SODIUM CHLORIDE (POUR BTL) OPTIME
TOPICAL | Status: DC | PRN
  Administered 2024-08-12 (×2): 1000 mL

## 2024-08-12 MED ORDER — PROPOFOL 1000 MG/100ML IV EMUL
INTRAVENOUS | Status: AC
  Filled 2024-08-12: qty 200

## 2024-08-12 MED ORDER — FENTANYL CITRATE (PF) 100 MCG/2ML IJ SOLN
25.0000 ug | INTRAMUSCULAR | Status: DC | PRN
  Administered 2024-08-12 (×2): 50 ug via INTRAVENOUS

## 2024-08-12 MED ORDER — POTASSIUM CHLORIDE IN NACL 20-0.9 MEQ/L-% IV SOLN
INTRAVENOUS | Status: DC
  Filled 2024-08-12: qty 1000

## 2024-08-12 MED ORDER — BUPIVACAINE LIPOSOME 1.3 % IJ SUSP
INTRAMUSCULAR | Status: AC
  Filled 2024-08-12: qty 20

## 2024-08-12 MED ORDER — OXYCODONE HCL 5 MG/5ML PO SOLN
ORAL | Status: AC
  Filled 2024-08-12: qty 5

## 2024-08-12 MED ORDER — FLEET ENEMA RE ENEM: 1.0000 | ENEMA | Freq: Once | RECTAL | Status: DC | PRN

## 2024-08-12 MED ORDER — DEXAMETHASONE SOD PHOSPHATE PF 10 MG/ML IJ SOLN
INTRAMUSCULAR | Status: DC | PRN
  Administered 2024-08-12: 5 mg via INTRAVENOUS

## 2024-08-12 MED ORDER — LIDOCAINE 2% (20 MG/ML) 5 ML SYRINGE
INTRAMUSCULAR | Status: DC | PRN
  Administered 2024-08-12: 60 mg via INTRAVENOUS

## 2024-08-12 MED ORDER — BUPIVACAINE-EPINEPHRINE (PF) 0.25% -1:200000 IJ SOLN
INTRAMUSCULAR | Status: DC | PRN
  Administered 2024-08-12: 40 mL

## 2024-08-12 MED ORDER — METHOCARBAMOL 500 MG PO TABS
500.0000 mg | ORAL_TABLET | Freq: Four times a day (QID) | ORAL | Status: DC | PRN
  Administered 2024-08-12 – 2024-08-13 (×2): 500 mg via ORAL
  Filled 2024-08-12 (×2): qty 1

## 2024-08-12 MED ORDER — POVIDONE-IODINE 7.5 % EX SOLN
Freq: Once | CUTANEOUS | Status: DC
  Filled 2024-08-12: qty 118

## 2024-08-12 MED ORDER — NOREPINEPHRINE 4 MG/250ML-% IV SOLN
INTRAVENOUS | Status: AC
  Filled 2024-08-12: qty 250

## 2024-08-12 MED ORDER — ONDANSETRON HCL 4 MG/2ML IJ SOLN: 4.0000 mg | Freq: Four times a day (QID) | INTRAMUSCULAR | Status: DC | PRN

## 2024-08-12 MED ORDER — THROMBIN 20000 UNITS EX SOLR
CUTANEOUS | Status: AC
  Filled 2024-08-12: qty 20000

## 2024-08-12 MED ORDER — ACETAMINOPHEN 325 MG PO TABS: 650.0000 mg | ORAL_TABLET | ORAL | Status: DC | PRN

## 2024-08-12 MED ORDER — GABAPENTIN 300 MG PO CAPS
300.0000 mg | ORAL_CAPSULE | Freq: Three times a day (TID) | ORAL | Status: DC
  Administered 2024-08-12 – 2024-08-13 (×3): 300 mg via ORAL
  Filled 2024-08-12 (×3): qty 1

## 2024-08-12 MED ORDER — SODIUM CHLORIDE 0.9 % IV SOLN: 12.5000 mg | INTRAVENOUS | Status: DC | PRN

## 2024-08-12 MED ORDER — PHENYLEPHRINE HCL-NACL 20-0.9 MG/250ML-% IV SOLN
INTRAVENOUS | Status: DC | PRN
  Administered 2024-08-12: 25 ug/min via INTRAVENOUS

## 2024-08-12 MED ORDER — MIDAZOLAM HCL (PF) 2 MG/2ML IJ SOLN
INTRAMUSCULAR | Status: DC | PRN
  Administered 2024-08-12: 2 mg via INTRAVENOUS

## 2024-08-12 MED ORDER — MINERAL OIL LIGHT OIL
TOPICAL_OIL | Status: DC | PRN
  Administered 2024-08-12: 1 via TOPICAL

## 2024-08-12 MED ORDER — ONDANSETRON HCL 4 MG PO TABS: 4.0000 mg | ORAL_TABLET | Freq: Four times a day (QID) | ORAL | Status: DC | PRN

## 2024-08-12 MED ORDER — TOPIRAMATE 25 MG PO TABS
25.0000 mg | ORAL_TABLET | Freq: Two times a day (BID) | ORAL | Status: DC
  Administered 2024-08-12 – 2024-08-13 (×2): 25 mg via ORAL
  Filled 2024-08-12 (×2): qty 1

## 2024-08-12 MED ORDER — SODIUM CHLORIDE 0.9% FLUSH: 3.0000 mL | INTRAVENOUS | Status: DC | PRN

## 2024-08-12 MED ORDER — SODIUM CHLORIDE 0.9% FLUSH
3.0000 mL | Freq: Two times a day (BID) | INTRAVENOUS | Status: DC
  Administered 2024-08-12 (×2): 3 mL via INTRAVENOUS

## 2024-08-12 MED ORDER — CEFAZOLIN SODIUM-DEXTROSE 2-4 GM/100ML-% IV SOLN
2.0000 g | Freq: Three times a day (TID) | INTRAVENOUS | Status: AC
  Administered 2024-08-12 – 2024-08-13 (×2): 2 g via INTRAVENOUS
  Filled 2024-08-12 (×2): qty 100

## 2024-08-12 MED ORDER — METHOCARBAMOL 500 MG PO TABS
500.0000 mg | ORAL_TABLET | Freq: Four times a day (QID) | ORAL | 2 refills | Status: AC | PRN
  Filled 2024-08-12: qty 30, 8d supply, fill #0

## 2024-08-12 MED ORDER — OXYCODONE HCL 5 MG PO TABS: 5.0000 mg | ORAL_TABLET | Freq: Once | ORAL | Status: AC | PRN

## 2024-08-12 MED ORDER — ONDANSETRON HCL 4 MG/2ML IJ SOLN
INTRAMUSCULAR | Status: DC | PRN
  Administered 2024-08-12: 4 mg via INTRAVENOUS

## 2024-08-12 MED ORDER — CHLORHEXIDINE GLUCONATE 0.12 % MT SOLN
15.0000 mL | Freq: Once | OROMUCOSAL | Status: AC
  Administered 2024-08-12: 15 mL via OROMUCOSAL
  Filled 2024-08-12: qty 15

## 2024-08-12 NOTE — Plan of Care (Signed)

## 2024-08-12 NOTE — H&P (Signed)
 "    PREOPERATIVE H&P  Chief Complaint: Bilateral leg pain  HPI: Dustin Kennedy is a 59 y.o. male who presents with ongoing pain in the bilateral legs  MRI reveals stenosis and instability at L3/4   Patient has failed multiple forms of conservative care and continues to have pain (see office notes for additional details regarding the patient's full course of treatment)  Past Medical History:  Diagnosis Date   Hyperlipidemia    Hypertension    Sleep apnea    mild 2018   Past Surgical History:  Procedure Laterality Date   ENDOVENOUS ABLATION SAPHENOUS VEIN W/ Kennedy Right 01/05/2018   lipomas removal     removal X 4   LT shoulder surgery  07/30/1995   for bone spurs   Social History   Socioeconomic History   Marital status: Married    Spouse name: Laneta   Number of children: 2   Years of education: [employer] education level: Associate degree: academic program  Occupational History   Occupation: [employer]  Tobacco Use   Smoking status: Never   Smokeless tobacco: Never  Vaping Use   Vaping status: Never Used  Substance and Sexual Activity   Alcohol use: Yes    Comment: 1-4 drinks per month or less   Drug use: No   Sexual activity: Not on file    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  Other Topics Concern   Not on file  Social History Narrative   Lives w/ wife   Caffeine use: 2-3 cups coffee per day   Social Drivers of Health   Tobacco Use: Low Risk (08/12/2024)   Patient History    Smoking Tobacco Use: Never    Smokeless Tobacco Use: Never    Passive Exposure: Not on file  Financial Resource Strain: Low Risk (05/07/2024)   Overall Financial Resource Strain (CARDIA)    Difficulty of Paying Living Expenses: Not hard at all  Food Insecurity: No Food Insecurity (05/07/2024)   Epic    Worried About Programme Researcher, Broadcasting/film/video in the Last Year: Never  true    Ran Out of Food in the Last Year: Never true  Transportation Needs: No Transportation Needs (05/07/2024)   Epic    Lack of Transportation (Medical): No    Lack of Transportation (Non-Medical): No  Physical Activity: Sufficiently Active (05/07/2024)   Exercise Vital Sign    Days of Exercise per Week: 5 days    Minutes of Exercise per Session: 40 min  Stress: No Stress Concern Present (05/07/2024)   [employer] of Occupational Health - Occupational Stress Questionnaire    Feeling of Stress: Only a little  Social Connections: Moderately Integrated (05/07/2024)   Social Connection and Isolation Panel    Frequency of Communication with Friends and Family: Twice a week    Frequency of Social Gatherings with Friends and Family: Once a week    Attends Religious Services: 1 to 4 times per year    Active Member of Clubs or Organizations: No    Attends Banker Meetings: Not on file    Marital Status: Married  Depression (PHQ2-9): Low Risk (06/30/2024)   Depression (PHQ2-9)    PHQ-2 Score: 0  Alcohol Screen: Low Risk (05/07/2024)   Alcohol Screen    Last Alcohol Screening Score (AUDIT): 3  Housing: Unknown (05/07/2024)   Epic    Unable  to Pay for Housing in the Last Year: No    Number of Times Moved in the Last Year: Not on file    Homeless in the Last Year: No  Utilities: Not on file  Health Literacy: Not on file   Family History  Problem Relation Age of Onset   Hypertension Unknown        family history   Hyperlipidemia Unknown        family history   Thyroid  disease Unknown        family history   Stroke Unknown        family history   Allergies[1] Prior to Admission medications  Medication Sig Start Date End Date Taking? Authorizing Provider  Ascorbic Acid (VITAMIN C PO) Take 1 tablet by mouth daily.   Yes [provider]  gabapentin  (NEURONTIN ) 300 MG capsule Take 300 mg by mouth 3 (three) times daily.   Yes [provider]   meloxicam  (MOBIC ) 15 MG tablet TAKE 1 TABLET BY MOUTH DAILY AS  NEEDED FOR PAIN Patient taking differently: Take 15 mg by mouth at bedtime. for pain 07/30/24  Yes Alvan Dorothyann BIRCH, MD  Multiple Vitamin (MULTIVITAMIN WITH MINERALS) TABS tablet Take 1 tablet by mouth in the morning.   Yes [provider]  testosterone  (ANDROGEL ) 50 MG/5GM (1%) GEL Place 10 g onto the skin daily. 06/30/24  Yes Alvan Dorothyann BIRCH, MD  topiramate  (TOPAMAX ) 25 MG tablet Take 1 tablet (25 mg total) by mouth 2 (two) times daily. 08/02/24  Yes Alvan Dorothyann BIRCH, MD  eletriptan  (RELPAX ) 20 MG tablet Take 1 tablet (20 mg total) by mouth as needed for migraine or headache. May repeat in 2 hours if headache persists or recurs. Patient not taking: Reported on 08/03/2024 08/02/24   Alvan Dorothyann BIRCH, MD  testosterone  (ANDROGEL ) 50 MG/5GM (1%) GEL Place 10 g onto the skin daily. 07/04/24   Alvan Dorothyann BIRCH, MD     All other systems have been reviewed and were otherwise negative with the exception of those mentioned in the HPI and as above.  Physical Exam: Vitals:   08/12/24 0545  BP: (!) 150/89  Pulse: 76  Resp: 17  Temp: 98.4 F (36.9 C)  SpO2: 95%    Body mass index is 34.44 kg/m.  General: Alert, no acute distress Cardiovascular: No pedal edema Respiratory: No cyanosis, no use of accessory musculature Skin: No lesions in the area of chief complaint Neurologic: Sensation intact distally Psychiatric: Patient is competent for consent with normal mood and affect Lymphatic: No axillary or cervical lymphadenopathy   Assessment/Plan: LUMBAR RADICULOPATHY AND INSTABILITY Plan for Procedures: TRANSFORAMINAL LUMBAR INTERBODY FUSION (TLIF) WITH PEDICLE SCREW FIXATION 1 LEVEL   Oneil LITTIE Priestly, MD 08/12/2024 7:25 AM    [1]  Allergies Allergen Reactions   Fortesta  [Testosterone ] Rash    Skin irritation   "

## 2024-08-12 NOTE — Transfer of Care (Signed)
 Immediate Anesthesia Transfer of Care Note  Patient: Dustin Kennedy  Procedure(s) Performed: TRANSFORAMINAL LUMBAR INTERBODY FUSION (TLIF) WITH PEDICLE SCREW FIXATION 1 LEVEL (Left: Spine Lumbar)  Patient Location: PACU  Anesthesia Type:General  Level of Consciousness: awake  Airway & Oxygen Therapy: Patient Spontanous Breathing  Post-op Assessment: Report given to RN  Post vital signs: stable  Last Vitals:  Vitals Value Taken Time  BP 121/67 08/12/24 10:45  Temp    Pulse 80 08/12/24 10:46  Resp 17 08/12/24 10:46  SpO2 92 % 08/12/24 10:46  Vitals shown include unfiled device data.  Last Pain:  Vitals:   08/12/24 0608  TempSrc:   PainSc: 5       Patients Stated Pain Goal: 0 (08/12/24 0600)  Complications: There were no known notable events for this encounter.

## 2024-08-12 NOTE — Anesthesia Procedure Notes (Signed)
 Procedure Name: Intubation Date/Time: 08/12/2024 7:45 AM  Performed by: Theophilus Burnet, Aloysius Pour, CRNAPre-anesthesia Checklist: Patient identified, Emergency Drugs available, Suction available and Patient being monitored Patient Re-evaluated:Patient Re-evaluated prior to induction Oxygen Delivery Method: Circle system utilized Preoxygenation: Pre-oxygenation with 100% oxygen Induction Type: IV induction Ventilation: Mask ventilation without difficulty Laryngoscope Size: Mac and 4 Grade View: Grade I Tube type: Oral Tube size: 7.5 mm Number of attempts: 1 Airway Equipment and Method: Stylet Placement Confirmation: ETT inserted through vocal cords under direct vision, positive ETCO2 and breath sounds checked- equal and bilateral Secured at: 23 cm Tube secured with: Tape Dental Injury: Teeth and Oropharynx as per pre-operative assessment

## 2024-08-12 NOTE — Anesthesia Postprocedure Evaluation (Signed)
"   Anesthesia Post Note  Patient: Dustin Kennedy  Procedure(s) Performed: TRANSFORAMINAL LUMBAR INTERBODY FUSION (TLIF) WITH PEDICLE SCREW FIXATION 1 LEVEL (Left: Spine Lumbar)     Patient location during evaluation: PACU Anesthesia Type: General Level of consciousness: awake and alert Pain management: pain level controlled Vital Signs Assessment: post-procedure vital signs reviewed and stable Respiratory status: spontaneous breathing, nonlabored ventilation, respiratory function stable and patient connected to nasal cannula oxygen Cardiovascular status: blood pressure returned to baseline and stable Postop Assessment: no apparent nausea or vomiting Anesthetic complications: no   There were no known notable events for this encounter.  Last Vitals:  Vitals:   08/12/24 1241 08/12/24 1305  BP: 128/80 129/79  Pulse: 69 79  Resp: 11 18  Temp: 36.7 C 36.5 C  SpO2: 93% 95%    Last Pain:  Vitals:   08/12/24 1305  TempSrc: Oral  PainSc:                  Debby FORBES Like      "

## 2024-08-12 NOTE — Op Note (Signed)
 PATIENT NAME: Dustin Kennedy   MEDICAL RECORD NO.:   [id number]    DATE OF BIRTH: 05-Jul-1966   DATE OF PROCEDURE: 08/12/2024                               OPERATIVE REPORT     PREOPERATIVE DIAGNOSES: 1. Bilateral lumbar radiculopathy 2. L3-4 spinal stenosis 3. L3-4 spondylolisthesis   POSTOPERATIVE DIAGNOSES: 1. Bilateral lumbar radiculopathy 2. L3-4 spinal stenosis 3. L3-4 spondylolisthesis   PROCEDURES: 1. L3-4 decompression 2. Left-sided L3-4 transforaminal lumbar interbody fusion 3. Right-sided L3-4 posterolateral fusion 4. Insertion of interbody device x1 (Globus expandable intervertebral spacer). 5. Placement of posterior instrumentation at L3, L4 bilaterally 6. Use of local autograft 7. Use of morselized allograft - DBX mix 8. Intraoperative use of fluoroscopy   SURGEON:  Oneil Priestly, MD.   ASSISTANTBETHA Ileana Clara, PA-C.   ANESTHESIA:  General endotracheal anesthesia.   COMPLICATIONS:  None.   DISPOSITION:  Stable.   ESTIMATED BLOOD LOSS:   Minimal   INDICATIONS FOR SURGERY:  Briefly, Dustin Kennedy is a pleasant 59 y.o. -year-old patient who did present to me with severe and ongoing pain in the bilateral legs. I did feel that the symptoms were secondary to the findings noted above.   The patient failed conservative care and did wish to proceed with the procedure  noted above.   OPERATIVE DETAILS:  On 08/12/2024, the patient was brought to surgery and general endotracheal anesthesia was administered.  The patient was placed prone on a well-padded flat Jackson bed with a spinal frame.  Antibiotics were given and a time-out procedure was performed. The back was prepped and draped in the usual fashion.  A midline incision was made overlying the L3-4 intervertebral space.  The fascia was incised at the midline.  The paraspinal musculature was bluntly swept laterally.  Anatomic landmarks for the pedicles were exposed. Using fluoroscopy, I did cannulate the L3 and  L4 pedicles bilaterally, using a medial to lateral cortical trajectory technique.  On the right side, the posterolateral gutter and right facet joint at L3-4 was decorticated using a high-speed bur.  This was in anticipation of a right sided posterolateral fusion.  At this point, 6 x 30 mm screws were placed into the L3 and L4 pedicles, and a 50-mm rod was placed and distraction was applied across the rod on the right side.  On the left side, the cannulated pedicle holes were filled with bone wax.  I then proceeded with the decompressive aspect of the procedure.  A bilateral partial facetectomy was performed, with removal of the ligamentum flavum bilaterally.  This did decompress the right and left lateral recess.  With an assistant holding medial retraction of the traversing left L4 nerve, I did perform an annulotomy at the posterolateral aspect of the L3-4 intervertebral space.  I then used a series of curettes and pituitary rongeurs to perform a thorough and complete intervertebral diskectomy.  The intervertebral space was then liberally packed with autograft as well as allograft in the form of DBX mix, as was the appropriate-sized intervertebral spacer.  The spacer was then tamped into position in the usual fashion, and expanded to 10 mm in height.  I was very pleased with the press-fit of the spacer.  I then placed 6 x 30 mm screws on the left at L3 and L4.  A 50-mm rod was then placed and caps were placed. The distraction  was then released on the contralateral right side.  All 4 caps were then locked.  The wound was copiously irrigated with a total of approximately 3 L prior to placing the bone graft.  Additional autograft and allograft were then packed into the posterolateral gutter on the right side to help aid in the posterolateral L3-4 fusion.  The wound was explored for any undue bleeding and there was no substantial bleeding encountered.  Gel-Foam was placed over the laminectomy  site.  The wound was then closed in layers using #1 Vicryl followed by 2-0 Vicryl, followed by 4-0 Monocryl.  Benzoin and Steri-Strips were applied followed by sterile dressing.    Of note, Ileana Clara was my assistant throughout surgery, and did aid in retraction, the decompression, placement of the hardware, suctioning, and closure.     Oneil Priestly, MD

## 2024-08-13 ENCOUNTER — Encounter (HOSPITAL_COMMUNITY): Payer: Self-pay | Admitting: Orthopedic Surgery

## 2024-08-13 DIAGNOSIS — M48061 Spinal stenosis, lumbar region without neurogenic claudication: Secondary | ICD-10-CM | POA: Diagnosis not present

## 2024-08-13 MED FILL — Thrombin For Soln 20000 Unit: CUTANEOUS | Qty: 1 | Status: AC

## 2024-08-13 NOTE — Evaluation (Signed)
 Occupational Therapy Evaluation Patient Details Name: Dustin Kennedy MRN: [id number] DOB: 10-10-1965 Today's Date: 08/13/2024   History of Present Illness   Pt is a 59 y/o M admitted for planned L3-4 TLIF surgery on 08/12/24. PMHx: HTN, HLD, sleep apnea.     Clinical Impressions Pt seen for OT evaluation this AM. Participatory and eager to move. PTA, pt lived with his wife in a 2-story home, working full time, fully independent. He presents today with minimal pain post-op. Functionally, he was mod I level for all UB/LB self-care, functional transfers, and ambulation without AD. Reviewed back precautions with pt maintaining well. Pt is functioning at a level that is adequate for d/c from OT standpoint, no further acute nor post-acute OT needs.     If plan is discharge home, recommend the following:   Assistance with cooking/housework;Assist for transportation     Functional Status Assessment         Equipment Recommendations   None recommended by OT     Recommendations for Other Services         Precautions/Restrictions   Precautions Precautions: Back Precaution Booklet Issued: No (verbally reviewed) Recall of Precautions/Restrictions: Intact Restrictions Weight Bearing Restrictions Per Provider Order: No     Mobility Bed Mobility Overal bed mobility: Modified Independent             General bed mobility comments: demonstrated great log roll technique with HOB flat, did not use bed rails (to simulate home environment)    Transfers Overall transfer level: Modified independent Equipment used: None               General transfer comment: handoff to PT to ambulate in hallway      Balance Overall balance assessment: Modified Independent                                         ADL either performed or assessed with clinical judgement   ADL Overall ADL's : Modified independent                                        General ADL Comments: donned underwear, tshirt, and TLSO brace mod I; toilet transfer with mod I using grab bars and demo'd good form     Vision Baseline Vision/History: 1 Wears glasses (readers) Ability to See in Adequate Light: 0 Adequate Patient Visual Report: No change from baseline       Perception         Praxis         Pertinent Vitals/Pain Pain Assessment Pain Assessment: 0-10 Pain Score: 1  Pain Location: back incision site Pain Descriptors / Indicators: Discomfort Pain Intervention(s): Monitored during session     Extremity/Trunk Assessment Upper Extremity Assessment Upper Extremity Assessment: Overall WFL for tasks assessed   Lower Extremity Assessment Lower Extremity Assessment: Defer to PT evaluation   Cervical / Trunk Assessment Cervical / Trunk Assessment: Normal   Communication Communication Communication: No apparent difficulties   Cognition Arousal: Alert Behavior During Therapy: WFL for tasks assessed/performed Cognition: No apparent impairments             OT - Cognition Comments: very pleasant & receptive to OT education                 Following commands:  Intact       Cueing  General Comments   Cueing Techniques: Verbal cues;Gestural cues  posterior back incision appeared C/D/I   Exercises     Shoulder Instructions      Home Living Family/patient expects to be discharged to:: Private residence Living Arrangements: Spouse/significant other Available Help at Discharge: Family;Available 24 hours/day (wife - initial 24 hr assist, wife works for [employer] but is taking a few days off work) Type of Home: [employer] Access: Stairs to enter [employer] of Steps: 2 STE Entrance Stairs-Rails: None Home Layout: Two level;Full bath on main level;Able to live on main level with bedroom/bathroom (primary bedroom/bathroom on main level) Alternate Level Stairs-Number of Steps: FF Alternate Level Stairs-Rails:   (unilateral HR) Bathroom Shower/Tub: Tub/shower unit   Firefighter: Standard     Home Equipment: Agricultural Consultant (2 wheels) (family friend loaned pt RW if needed)          Prior Functioning/Environment Prior Level of Function : Independent/Modified Independent;Working/employed;Driving (denies falls hx)             Mobility Comments: no AD PTA ADLs Comments: indep; enjoys working out    OT Problem List: Impaired balance (sitting and/or standing);Pain   OT Treatment/Interventions:        OT Goals(Current goals can be found in the care plan section)   Acute Rehab OT Goals Patient Stated Goal: go home and feel better   OT Frequency:       Co-evaluation              AM-PAC OT 6 Clicks Daily Activity     Outcome Measure Help from another person eating meals?: None Help from another person taking care of personal grooming?: None Help from another person toileting, which includes using toliet, bedpan, or urinal?: None Help from another person bathing (including washing, rinsing, drying)?: None Help from another person to put on and taking off regular upper body clothing?: None Help from another person to put on and taking off regular lower body clothing?: None 6 Click Score: 24   End of Session Equipment Utilized During Treatment: Back brace Nurse Communication: Mobility status  Activity Tolerance: Patient tolerated treatment well Patient left: Other (comment) (ambulating in hall with PT staff)  OT Visit Diagnosis: Pain Pain - part of body:  (back)                Time: 9256-9242 OT Time Calculation (min): 14 min Charges:  OT General Charges $OT Visit: 1 Visit OT Evaluation $OT Eval Low Complexity: 1 Low  Malcom Selmer M. [place], OTR/L [facility] [phone] Secure Chat Preferred  Rikki Burma 08/13/2024, 8:04 AM

## 2024-08-13 NOTE — Progress Notes (Signed)
" ° ° °  Patient doing well  Reports resolution of leg pain Has been ambulating   Physical Exam: Vitals:   08/13/24 0032 08/13/24 0547  BP: 108/64 (!) 153/73  Pulse: 79 69  Resp: 20 20  Temp: 98 F (36.7 C) 98 F (36.7 C)  SpO2: 94% 97%    Dressing in place NVI  POD #1 s/p lumbar decompression and fusion, doing well  - up with PT/OT, encourage ambulation - Percocet for pain, Robaxin  for muscle spasms - d/c home today with f/u in 2 weeks "

## 2024-08-13 NOTE — Progress Notes (Signed)

## 2024-08-13 NOTE — Evaluation (Signed)
 Physical Therapy Evaluation Patient Details Name: Dustin Kennedy MRN: [id number] DOB: 20-Jan-1966 Today's Date: 08/13/2024  History of Present Illness  Pt is a 59 y/o M admitted for planned L3-4 TLIF surgery on 08/12/24. PMHx: HTN, HLD, sleep apnea.  Clinical Impression  PT eval complete. PTA pt active and independent. On eval, pt demo mod I bed mobility and transfers. Supervision amb without AD 500' and ascend/descend 12 steps with L rail. Educated on back precautions and brace wear schedule. Pt's wife will be at home 24/7 upon initial d/c. All education complete. All questions answered. Plan is for d/c home today. No futher skilled PT intervention indicated. No DME needs. PT signing off.        If plan is discharge home, recommend the following:     Can travel by private vehicle        Equipment Recommendations None recommended by PT  Recommendations for Other Services       Functional Status Assessment Patient has had a recent decline in their functional status and demonstrates the ability to make significant improvements in function in a reasonable and predictable amount of time.     Precautions / Restrictions Precautions Precautions: Back Recall of Precautions/Restrictions: Intact Precaution/Restrictions Comments: Educated on 3/3 back precautions and brace wear schedule. Handout provided. Required Braces or Orthoses: Spinal Brace Spinal Brace: Thoracolumbosacral orthotic;Applied in sitting position      Mobility  Bed Mobility Overal bed mobility: Modified Independent                  Transfers Overall transfer level: Modified independent Equipment used: None                    Ambulation/Gait Ambulation/Gait assistance: Supervision Gait Distance (Feet): 500 Feet Assistive device: None Gait Pattern/deviations: Step-through pattern Gait velocity: mildly decreased Gait velocity interpretation: 1.31 - 2.62 ft/sec, indicative of limited community  ambulator   General Gait Details: steady gait without AD  Stairs Stairs: Yes Stairs assistance: Supervision Stair Management: One rail Left, Step to pattern, Forwards Number of Stairs: 12    Wheelchair Mobility     Tilt Bed    Modified Rankin (Stroke Patients Only)       Balance Overall balance assessment: Modified Independent                                           Pertinent Vitals/Pain Pain Assessment Pain Assessment: Faces Faces Pain Scale: Hurts a little bit Pain Location: back Pain Descriptors / Indicators: Sore Pain Intervention(s): Monitored during session    Home Living Family/patient expects to be discharged to:: Private residence Living Arrangements: Spouse/significant other Available Help at Discharge: Family;Available 24 hours/day (wife - initial 24 hr assist, wife works for [employer] but is taking a few days off work) Type of Home: [employer] Access: Stairs to enter Entrance Stairs-Rails: None Secretary/administrator of Steps: 2 Alternate Level Stairs-Number of Steps: flight Home Layout: Two level;Full bath on main level;Able to live on main level with bedroom/bathroom Home Equipment: Rolling Walker (2 wheels)      Prior Function Prior Level of Function : Independent/Modified Independent;Working/employed;Driving             Mobility Comments: no AD ADLs Comments: indep; enjoys working out     Extremity/Trunk Assessment   Upper Extremity Assessment Upper Extremity Assessment: Overall WFL for tasks assessed  Lower Extremity Assessment Lower Extremity Assessment: Overall WFL for tasks assessed    Cervical / Trunk Assessment Cervical / Trunk Assessment: Back Surgery  Communication   Communication Communication: No apparent difficulties    Cognition Arousal: Alert Behavior During Therapy: WFL for tasks assessed/performed   PT - Cognitive impairments: No apparent impairments                          Following commands: Intact       Cueing Cueing Techniques: Verbal cues     General Comments General comments (skin integrity, edema, etc.): posterior back incision appeared C/D/I    Exercises     Assessment/Plan    PT Assessment Patient does not need any further PT services  PT Problem List         PT Treatment Interventions      PT Goals (Current goals can be found in the Care Plan section)  Acute Rehab PT Goals Patient Stated Goal: home today PT Goal Formulation: All assessment and education complete, DC therapy    Frequency       Co-evaluation               AM-PAC PT 6 Clicks Mobility  Outcome Measure Help needed turning from your back to your side while in a flat bed without using bedrails?: None Help needed moving from lying on your back to sitting on the side of a flat bed without using bedrails?: None Help needed moving to and from a bed to a chair (including a wheelchair)?: None Help needed standing up from a chair using your arms (e.g., wheelchair or bedside chair)?: None Help needed to walk in hospital room?: A Little Help needed climbing 3-5 steps with a railing? : A Little 6 Click Score: 22    End of Session Equipment Utilized During Treatment: Back brace Activity Tolerance: Patient tolerated treatment well Patient left: in chair;with call bell/phone within reach Nurse Communication: Mobility status PT Visit Diagnosis: Difficulty in walking, not elsewhere classified (R26.2)    Time: 9199-9185 PT Time Calculation (min) (ACUTE ONLY): 14 min   Charges:   PT Evaluation $PT Eval Low Complexity: 1 Low   PT General Charges $$ ACUTE PT VISIT: 1 Visit         Sari MATSU., PT  Office # [phone]   Erven Sari Shaker 08/13/2024, 8:56 AM

## 2024-08-13 NOTE — Plan of Care (Signed)
" °  Problem: Education: Goal: Knowledge of General Education information will improve Description: Including pain rating scale, medication(s)/side effects and non-pharmacologic comfort measures Outcome: Completed/Met   Problem: Health Behavior/Discharge Planning: Goal: Ability to manage health-related needs will improve Outcome: Completed/Met   Problem: Clinical Measurements: Goal: Ability to maintain clinical measurements within normal limits will improve Outcome: Completed/Met Goal: Will remain free from infection Outcome: Completed/Met Goal: Diagnostic test results will improve Outcome: Completed/Met Goal: Respiratory complications will improve Outcome: Completed/Met Goal: Cardiovascular complication will be avoided Outcome: Completed/Met   Problem: Activity: Goal: Risk for activity intolerance will decrease Outcome: Completed/Met   Problem: Nutrition: Goal: Adequate nutrition will be maintained Outcome: Completed/Met   Problem: Coping: Goal: Level of anxiety will decrease Outcome: Completed/Met   Problem: Elimination: Goal: Will not experience complications related to bowel motility Outcome: Completed/Met Goal: Will not experience complications related to urinary retention Outcome: Completed/Met   Problem: Pain Managment: Goal: General experience of comfort will improve and/or be controlled Outcome: Completed/Met   Problem: Safety: Goal: Ability to remain free from injury will improve Outcome: Completed/Met   Problem: Skin Integrity: Goal: Risk for impaired skin integrity will decrease Outcome: Completed/Met   Problem: Education: Goal: Knowledge of the prescribed therapeutic regimen will improve Outcome: Completed/Met   Problem: Bowel/Gastric: Goal: Gastrointestinal status for postoperative course will improve Outcome: Completed/Met   Problem: Cardiac: Goal: Ability to maintain an adequate cardiac output Outcome: Completed/Met Goal: Will show no  evidence of cardiac arrhythmias Outcome: Completed/Met   Problem: Nutritional: Goal: Will attain and maintain optimal nutritional status Outcome: Completed/Met   Problem: Neurological: Goal: Will regain or maintain usual level of consciousness Outcome: Completed/Met   Problem: Clinical Measurements: Goal: Ability to maintain clinical measurements within normal limits Outcome: Completed/Met Goal: Postoperative complications will be avoided or minimized Outcome: Completed/Met   Problem: Respiratory: Goal: Will regain and/or maintain adequate ventilation Outcome: Completed/Met Goal: Respiratory status will improve Outcome: Completed/Met   Problem: Skin Integrity: Goal: Demonstrates signs of wound healing without infection Outcome: Completed/Met   Problem: Urinary Elimination: Goal: Will remain free from infection Outcome: Completed/Met Goal: Ability to achieve and maintain adequate urine output Outcome: Completed/Met   Problem: Education: Goal: Ability to verbalize activity precautions or restrictions will improve Outcome: Completed/Met Goal: Knowledge of the prescribed therapeutic regimen will improve Outcome: Completed/Met Goal: Understanding of discharge needs will improve Outcome: Completed/Met   Problem: Activity: Goal: Ability to avoid complications of mobility impairment will improve Outcome: Completed/Met Goal: Ability to tolerate increased activity will improve Outcome: Completed/Met Goal: Will remain free from falls Outcome: Completed/Met   Problem: Bowel/Gastric: Goal: Gastrointestinal status for postoperative course will improve Outcome: Completed/Met   Problem: Clinical Measurements: Goal: Ability to maintain clinical measurements within normal limits will improve Outcome: Completed/Met Goal: Postoperative complications will be avoided or minimized Outcome: Completed/Met Goal: Diagnostic test results will improve Outcome: Completed/Met   Problem:  Pain Management: Goal: Pain level will decrease Outcome: Completed/Met   Problem: Skin Integrity: Goal: Will show signs of wound healing Outcome: Completed/Met   Problem: Health Behavior/Discharge Planning: Goal: Identification of resources available to assist in meeting health care needs will improve Outcome: Completed/Met   Problem: Bladder/Genitourinary: Goal: Urinary functional status for postoperative course will improve Outcome: Completed/Met   "

## 2024-08-14 ENCOUNTER — Other Ambulatory Visit (HOSPITAL_BASED_OUTPATIENT_CLINIC_OR_DEPARTMENT_OTHER): Payer: Self-pay

## 2024-08-14 ENCOUNTER — Encounter: Payer: Self-pay | Admitting: Family Medicine

## 2024-08-16 ENCOUNTER — Other Ambulatory Visit (HOSPITAL_COMMUNITY): Payer: Self-pay

## 2024-08-16 NOTE — Telephone Encounter (Signed)
 Pharmacy Patient Advocate Encounter   Received notification from Physician's Office that prior authorization for androgel  is required/requested.   Insurance verification completed.   The patient is insured through [facility].   Per test claim: PA required; PA submitted to above mentioned insurance via Latent Key/confirmation #/EOC [id number] Status is pending

## 2024-08-18 ENCOUNTER — Other Ambulatory Visit (HOSPITAL_COMMUNITY): Payer: Self-pay

## 2024-08-18 NOTE — Telephone Encounter (Signed)
 Status is still pending. ICD-10 code/diagnosis indication submitted corresponded to the associated diagnosis listed on the active med order in Epic (E29.1)      We will reach out to clinic once we get a determination from plan.

## 2024-08-19 ENCOUNTER — Other Ambulatory Visit (HOSPITAL_BASED_OUTPATIENT_CLINIC_OR_DEPARTMENT_OTHER): Payer: Self-pay

## 2024-08-19 ENCOUNTER — Other Ambulatory Visit: Payer: Self-pay

## 2024-08-23 ENCOUNTER — Other Ambulatory Visit (HOSPITAL_COMMUNITY): Payer: Self-pay

## 2024-08-24 ENCOUNTER — Other Ambulatory Visit (HOSPITAL_COMMUNITY): Payer: Self-pay

## 2024-08-24 ENCOUNTER — Encounter: Payer: Self-pay | Admitting: Pharmacy Technician

## 2024-08-24 NOTE — Telephone Encounter (Signed)
 Last read by Cherene KATHEE Cruise at 1:32PM on 08/24/2024.

## 2024-08-24 NOTE — Telephone Encounter (Signed)
 Information was already faxed to plan.

## 2024-08-24 NOTE — Telephone Encounter (Signed)
 A user error has taken place: encounter opened in error, closed for administrative reasons.

## 2024-08-25 NOTE — Discharge Summary (Signed)
 "   Patient ID: Dustin Kennedy MRN: [id number] DOB/AGE: [birth year] 59 y.o.  Admit date: 08/12/2024 Discharge date: 08/13/2024  Admission Diagnoses:  Principal Problem:   Radiculopathy, lumbar region   Discharge Diagnoses:  Same  Past Medical History:  Diagnosis Date   Hyperlipidemia    Hypertension    Sleep apnea    mild 2018    Surgeries: Procedures: TRANSFORAMINAL LUMBAR INTERBODY FUSION (TLIF) WITH PEDICLE SCREW FIXATION 1 LEVEL on 08/12/2024   Consultants: None  Discharged Condition: Improved  Hospital Course: Dustin Kennedy is an 59 y.o. male who was admitted 08/12/2024 for operative treatment of Radiculopathy, lumbar region. Patient has severe unremitting pain that affects sleep, daily activities, and work/hobbies. After pre-op clearance the patient was taken to the operating room on 08/12/2024 and underwent  Procedures: TRANSFORAMINAL LUMBAR INTERBODY FUSION (TLIF) WITH PEDICLE SCREW FIXATION 1 LEVEL.    Patient was given perioperative antibiotics:  Anti-infectives (From admission, onward)    Start     Dose/Rate Route Frequency Ordered Stop   08/12/24 1600  ceFAZolin  (ANCEF ) IVPB 2g/100 mL premix        2 g 200 mL/hr over 30 Minutes Intravenous Every 8 hours 08/12/24 1243 08/13/24 0733   08/12/24 0600  ceFAZolin  (ANCEF ) IVPB 2g/100 mL premix        2 g 200 mL/hr over 30 Minutes Intravenous On call to O.R. 08/12/24 0549 08/12/24 0800        Patient was given sequential compression devices, early ambulation to prevent DVT.  Patient benefited maximally from hospital stay and there were no complications.    Recent vital signs: BP 128/77 (BP Location: Right Arm)   Pulse 67   Temp 98.1 F (36.7 C) (Oral)   Resp 19   Ht 5' 10 (1.778 m)   Wt 108.9 kg   SpO2 97%   BMI 34.44 kg/m    Discharge Medications:   Allergies as of 08/13/2024       Reactions   Fortesta  [testosterone ] Rash   Skin irritation        Medication List     TAKE these medications     eletriptan  20 MG tablet Commonly known as: Relpax  Take 1 tablet (20 mg total) by mouth as needed for migraine or headache. May repeat in 2 hours if headache persists or recurs.   gabapentin  300 MG capsule Commonly known as: NEURONTIN  Take 300 mg by mouth 3 (three) times daily.   methocarbamol  500 MG tablet Commonly known as: ROBAXIN  Take 1 tablet (500 mg total) by mouth every 6 (six) hours as needed for muscle spasms.   multivitamin with minerals Tabs tablet Take 1 tablet by mouth in the morning.   oxyCODONE -acetaminophen  5-325 MG tablet Commonly known as: PERCOCET/ROXICET Take 1-2 tablets by mouth every 4 (four) hours as needed for severe pain (pain score 7-10).   testosterone  50 MG/5GM (1%) Gel Commonly known as: ANDROGEL  Place 10 g onto the skin daily.   testosterone  50 MG/5GM (1%) Gel Commonly known as: ANDROGEL  Place 10 g onto the skin daily.   topiramate  25 MG tablet Commonly known as: TOPAMAX  Take 1 tablet (25 mg total) by mouth 2 (two) times daily.   VITAMIN C PO Take 1 tablet by mouth daily.        Diagnostic Studies: DG Lumbar Spine 2-3 Views Result Date: 08/12/2024 EXAM: 2 or 3 VIEW(S) LIMITED INTRAOPERATIVE XRAY OF THE LUMBAR SPINE 08/12/2024 10:15:00 AM COMPARISON: Earlier same day lumbar spine radiograph. CLINICAL HISTORY: 461500  Elective surgery [id number] Elective surgery 461500 Elective surgery 461500 FINDINGS: LUMBAR SPINE: BONES: Vertebral body heights are maintained. Alignment is normal. Limited intraoperative radiographs demonstrate a posterior lumbar interbody fusion (PLIF) at L3-L4 with bilateral pedicle screws, vertical interconnecting rods, and an interbody spacer. The hardware appears intact and normally aligned. DISCS AND DEGENERATIVE CHANGES: Posterior lumbar interbody fusion (PLIF) is present at L3-L4. There is moderate disc space narrowing at L5-S1 with associated endplate osteophytes and additional facet arthrosis at L5-S1. SOFT TISSUES: No acute  abnormality. IMPRESSION: 1. PLIF at L3-4 with bilateral pedicle screws, vertical interconnecting rods, and interbody spacer. Hardware appears intact and normally aligned. Electronically signed by: Donnice Mania MD 08/12/2024 02:21 PM EST RP Workstation: [id number]   DG Lumbar Spine 1 View Result Date: 08/12/2024 CLINICAL DATA:  Elective surgery. EXAM: LUMBAR SPINE - 1 VIEW COMPARISON:  Lumbar spine radiograph dated 05/07/2024. FINDINGS: Single lateral view provided. Two localizers projecting at the level of the L2-L3 and L3-L4 disc spaces. Multilevel facet arthropathy. IMPRESSION: Lumbar spine localization. Electronically Signed   By: Vanetta Chou M.D.   On: 08/12/2024 12:54   DG C-Arm 1-60 Min-No Report Result Date: 08/12/2024 Fluoroscopy was utilized by the requesting physician.  No radiographic interpretation.   DG C-Arm 1-60 Min-No Report Result Date: 08/12/2024 Fluoroscopy was utilized by the requesting physician.  No radiographic interpretation.    Disposition: Discharge disposition: 01-Home or Self Care        POD #1 s/p lumbar decompression and fusion, doing well   - up with PT/OT, encourage ambulation - Percocet for pain, Robaxin  for muscle spasms -Scripts for pain sent to pharmacy electronically  -D/C instructions sheet printed and in chart -D/C today  -F/U in office 2 weeks   Signed: Ileana PARAS Dustin Kennedy 08/25/2024, 10:16 AM       "

## 2024-08-27 ENCOUNTER — Other Ambulatory Visit (HOSPITAL_COMMUNITY): Payer: Self-pay

## 2024-08-27 NOTE — Telephone Encounter (Signed)
 Test claim is now going through, PA has been approved.

## 2024-08-30 ENCOUNTER — Other Ambulatory Visit (HOSPITAL_COMMUNITY): Payer: Self-pay

## 2024-11-11 ENCOUNTER — Institutional Professional Consult (permissible substitution) (INDEPENDENT_AMBULATORY_CARE_PROVIDER_SITE_OTHER): Admitting: Otolaryngology

## 2024-12-29 ENCOUNTER — Ambulatory Visit: Admitting: Family Medicine
